# Patient Record
Sex: Female | Born: 1945
Health system: Southern US, Community
[De-identification: ages and names within clinical notes are randomized; demographics above are authoritative.]

## PROBLEM LIST (undated history)

## (undated) DIAGNOSIS — K219 Gastro-esophageal reflux disease without esophagitis: Secondary | ICD-10-CM

## (undated) DIAGNOSIS — E7800 Pure hypercholesterolemia, unspecified: Secondary | ICD-10-CM

## (undated) DIAGNOSIS — E279 Disorder of adrenal gland, unspecified: Secondary | ICD-10-CM

## (undated) DIAGNOSIS — I4891 Unspecified atrial fibrillation: Secondary | ICD-10-CM

## (undated) DIAGNOSIS — Z9889 Other specified postprocedural states: Secondary | ICD-10-CM

## (undated) DIAGNOSIS — E278 Other specified disorders of adrenal gland: Secondary | ICD-10-CM

## (undated) DIAGNOSIS — C801 Malignant (primary) neoplasm, unspecified: Secondary | ICD-10-CM

## (undated) DIAGNOSIS — R519 Headache, unspecified: Secondary | ICD-10-CM

## (undated) DIAGNOSIS — D696 Thrombocytopenia, unspecified: Secondary | ICD-10-CM

## (undated) DIAGNOSIS — E041 Nontoxic single thyroid nodule: Secondary | ICD-10-CM

## (undated) DIAGNOSIS — E785 Hyperlipidemia, unspecified: Secondary | ICD-10-CM

## (undated) DIAGNOSIS — D72819 Decreased white blood cell count, unspecified: Secondary | ICD-10-CM

## (undated) DIAGNOSIS — N390 Urinary tract infection, site not specified: Secondary | ICD-10-CM

## (undated) DIAGNOSIS — R112 Nausea with vomiting, unspecified: Secondary | ICD-10-CM

## (undated) DIAGNOSIS — J189 Pneumonia, unspecified organism: Secondary | ICD-10-CM

## (undated) DIAGNOSIS — K140 Glossitis: Secondary | ICD-10-CM

## (undated) DIAGNOSIS — E871 Hypo-osmolality and hyponatremia: Secondary | ICD-10-CM

## (undated) DIAGNOSIS — N951 Menopausal and female climacteric states: Secondary | ICD-10-CM

## (undated) DIAGNOSIS — I10 Essential (primary) hypertension: Secondary | ICD-10-CM

## (undated) DIAGNOSIS — M1990 Unspecified osteoarthritis, unspecified site: Secondary | ICD-10-CM

## (undated) DIAGNOSIS — N952 Postmenopausal atrophic vaginitis: Secondary | ICD-10-CM

## (undated) DIAGNOSIS — F419 Anxiety disorder, unspecified: Secondary | ICD-10-CM

## (undated) DIAGNOSIS — I499 Cardiac arrhythmia, unspecified: Secondary | ICD-10-CM

## (undated) HISTORY — DX: Postmenopausal atrophic vaginitis: N95.2

## (undated) HISTORY — DX: Thrombocytopenia, unspecified: D69.6

## (undated) HISTORY — DX: Menopausal and female climacteric states: N95.1

## (undated) HISTORY — DX: Hyperlipidemia, unspecified: E78.5

## (undated) HISTORY — DX: Pure hypercholesterolemia, unspecified: E78.00

## (undated) HISTORY — DX: Unspecified atrial fibrillation: I48.91

## (undated) HISTORY — DX: Decreased white blood cell count, unspecified: D72.819

## (undated) HISTORY — PX: TUBAL LIGATION: SHX77

## (undated) HISTORY — PX: EYE SURGERY: SHX253

## (undated) HISTORY — PX: REPLACEMENT TOTAL KNEE: SUR1224

## (undated) HISTORY — PX: HERNIA REPAIR: SHX51

## (undated) HISTORY — DX: Nontoxic single thyroid nodule: E04.1

## (undated) HISTORY — DX: Other specified disorders of adrenal gland: E27.8

## (undated) HISTORY — PX: COLONOSCOPY: SHX174

## (undated) HISTORY — DX: Anxiety disorder, unspecified: F41.9

## (undated) HISTORY — PX: TONSILLECTOMY: SUR1361

## (undated) HISTORY — PX: JOINT REPLACEMENT: SHX530

## (undated) HISTORY — DX: Disorder of adrenal gland, unspecified: E27.9

## (undated) HISTORY — PX: ESOPHAGOGASTRODUODENOSCOPY: SHX1529

---

## 1998-09-19 ENCOUNTER — Other Ambulatory Visit: Admission: RE | Admit: 1998-09-19 | Discharge: 1998-09-19 | Payer: Self-pay | Admitting: Obstetrics & Gynecology

## 1999-03-19 ENCOUNTER — Other Ambulatory Visit: Admission: RE | Admit: 1999-03-19 | Discharge: 1999-03-19 | Payer: Self-pay | Admitting: Obstetrics & Gynecology

## 1999-09-29 ENCOUNTER — Other Ambulatory Visit: Admission: RE | Admit: 1999-09-29 | Discharge: 1999-09-29 | Payer: Self-pay | Admitting: Obstetrics & Gynecology

## 1999-11-25 ENCOUNTER — Encounter: Payer: Self-pay | Admitting: Obstetrics & Gynecology

## 1999-11-25 ENCOUNTER — Encounter: Admission: RE | Admit: 1999-11-25 | Discharge: 1999-11-25 | Payer: Self-pay | Admitting: Obstetrics & Gynecology

## 2000-11-29 ENCOUNTER — Other Ambulatory Visit: Admission: RE | Admit: 2000-11-29 | Discharge: 2000-11-29 | Payer: Self-pay | Admitting: Obstetrics & Gynecology

## 2000-12-29 ENCOUNTER — Encounter: Payer: Self-pay | Admitting: Obstetrics & Gynecology

## 2000-12-29 ENCOUNTER — Encounter: Admission: RE | Admit: 2000-12-29 | Discharge: 2000-12-29 | Payer: Self-pay | Admitting: Obstetrics & Gynecology

## 2001-12-30 ENCOUNTER — Encounter: Payer: Self-pay | Admitting: Obstetrics & Gynecology

## 2001-12-30 ENCOUNTER — Encounter: Admission: RE | Admit: 2001-12-30 | Discharge: 2001-12-30 | Payer: Self-pay | Admitting: Obstetrics & Gynecology

## 2002-06-22 ENCOUNTER — Other Ambulatory Visit: Admission: RE | Admit: 2002-06-22 | Discharge: 2002-06-22 | Payer: Self-pay | Admitting: Obstetrics & Gynecology

## 2003-01-11 ENCOUNTER — Encounter: Payer: Self-pay | Admitting: Obstetrics & Gynecology

## 2003-01-11 ENCOUNTER — Encounter: Admission: RE | Admit: 2003-01-11 | Discharge: 2003-01-11 | Payer: Self-pay | Admitting: Obstetrics & Gynecology

## 2003-07-05 ENCOUNTER — Other Ambulatory Visit: Admission: RE | Admit: 2003-07-05 | Discharge: 2003-07-05 | Payer: Self-pay | Admitting: Obstetrics & Gynecology

## 2004-09-15 ENCOUNTER — Other Ambulatory Visit: Admission: RE | Admit: 2004-09-15 | Discharge: 2004-09-15 | Payer: Self-pay | Admitting: Obstetrics & Gynecology

## 2005-01-12 ENCOUNTER — Encounter: Admission: RE | Admit: 2005-01-12 | Discharge: 2005-01-12 | Payer: Self-pay | Admitting: Obstetrics & Gynecology

## 2005-01-22 ENCOUNTER — Encounter: Admission: RE | Admit: 2005-01-22 | Discharge: 2005-01-22 | Payer: Self-pay | Admitting: Obstetrics & Gynecology

## 2006-01-15 ENCOUNTER — Encounter: Admission: RE | Admit: 2006-01-15 | Discharge: 2006-01-15 | Payer: Self-pay | Admitting: Obstetrics & Gynecology

## 2006-03-30 ENCOUNTER — Encounter: Admission: RE | Admit: 2006-03-30 | Discharge: 2006-03-30 | Payer: Self-pay | Admitting: Surgery

## 2006-04-16 ENCOUNTER — Ambulatory Visit (HOSPITAL_COMMUNITY): Admission: RE | Admit: 2006-04-16 | Discharge: 2006-04-16 | Payer: Self-pay | Admitting: Surgery

## 2006-09-23 ENCOUNTER — Encounter: Admission: RE | Admit: 2006-09-23 | Discharge: 2006-09-23 | Payer: Self-pay | Admitting: Surgery

## 2006-10-21 ENCOUNTER — Ambulatory Visit (HOSPITAL_BASED_OUTPATIENT_CLINIC_OR_DEPARTMENT_OTHER): Admission: RE | Admit: 2006-10-21 | Discharge: 2006-10-21 | Payer: Self-pay | Admitting: Surgery

## 2006-11-05 ENCOUNTER — Encounter: Admission: RE | Admit: 2006-11-05 | Discharge: 2006-11-05 | Payer: Self-pay | Admitting: Orthopedic Surgery

## 2007-01-06 ENCOUNTER — Ambulatory Visit (HOSPITAL_BASED_OUTPATIENT_CLINIC_OR_DEPARTMENT_OTHER): Admission: RE | Admit: 2007-01-06 | Discharge: 2007-01-06 | Payer: Self-pay | Admitting: Orthopedic Surgery

## 2007-01-06 ENCOUNTER — Encounter (INDEPENDENT_AMBULATORY_CARE_PROVIDER_SITE_OTHER): Payer: Self-pay | Admitting: Orthopedic Surgery

## 2007-01-17 ENCOUNTER — Encounter: Admission: RE | Admit: 2007-01-17 | Discharge: 2007-01-17 | Payer: Self-pay | Admitting: Family Medicine

## 2008-01-18 ENCOUNTER — Encounter: Admission: RE | Admit: 2008-01-18 | Discharge: 2008-01-18 | Payer: Self-pay | Admitting: Family Medicine

## 2008-09-08 ENCOUNTER — Inpatient Hospital Stay (HOSPITAL_COMMUNITY): Admission: EM | Admit: 2008-09-08 | Discharge: 2008-09-10 | Payer: Self-pay | Admitting: Emergency Medicine

## 2008-10-22 ENCOUNTER — Ambulatory Visit: Payer: Self-pay | Admitting: General Practice

## 2008-11-07 ENCOUNTER — Inpatient Hospital Stay: Payer: Self-pay | Admitting: General Practice

## 2008-12-05 ENCOUNTER — Encounter: Admission: RE | Admit: 2008-12-05 | Discharge: 2009-01-10 | Payer: Self-pay | Admitting: Orthopedic Surgery

## 2009-01-18 ENCOUNTER — Encounter: Admission: RE | Admit: 2009-01-18 | Discharge: 2009-01-18 | Payer: Self-pay | Admitting: Family Medicine

## 2010-01-24 ENCOUNTER — Encounter: Admission: RE | Admit: 2010-01-24 | Discharge: 2010-01-24 | Payer: Self-pay | Admitting: Family Medicine

## 2010-05-18 ENCOUNTER — Encounter: Payer: Self-pay | Admitting: Obstetrics & Gynecology

## 2010-06-15 ENCOUNTER — Ambulatory Visit (INDEPENDENT_AMBULATORY_CARE_PROVIDER_SITE_OTHER): Payer: Medicare Other

## 2010-06-15 ENCOUNTER — Inpatient Hospital Stay (INDEPENDENT_AMBULATORY_CARE_PROVIDER_SITE_OTHER)
Admission: RE | Admit: 2010-06-15 | Discharge: 2010-06-15 | Disposition: A | Discharge status: Home / Self Care | Payer: Medicare Other | Source: Ambulatory Visit | Attending: Family Medicine | Admitting: Family Medicine

## 2010-06-15 DIAGNOSIS — S93409A Sprain of unspecified ligament of unspecified ankle, initial encounter: Secondary | ICD-10-CM

## 2010-08-05 LAB — BASIC METABOLIC PANEL
BUN: 10 mg/dL (ref 6–23)
BUN: 10 mg/dL (ref 6–23)
BUN: 11 mg/dL (ref 6–23)
BUN: 9 mg/dL (ref 6–23)
BUN: 6 mg/dL (ref 6–23)
BUN: 7 mg/dL (ref 6–23)
CO2: 21 mEq/L (ref 19–32)
CO2: 24 mEq/L (ref 19–32)
CO2: 25 mEq/L (ref 19–32)
CO2: 21 mEq/L (ref 19–32)
Calcium: 8.5 mg/dL (ref 8.4–10.5)
Calcium: 8.7 mg/dL (ref 8.4–10.5)
Calcium: 9 mg/dL (ref 8.4–10.5)
Calcium: 8.7 mg/dL (ref 8.4–10.5)
Chloride: 89 mEq/L — ABNORMAL LOW (ref 96–112)
Chloride: 91 mEq/L — ABNORMAL LOW (ref 96–112)
Chloride: 90 mEq/L — ABNORMAL LOW (ref 96–112)
Creatinine, Ser: 0.74 mg/dL (ref 0.4–1.2)
Creatinine, Ser: 0.75 mg/dL (ref 0.4–1.2)
Creatinine, Ser: 0.78 mg/dL (ref 0.4–1.2)
Creatinine, Ser: 0.86 mg/dL (ref 0.4–1.2)
Creatinine, Ser: 0.59 mg/dL (ref 0.4–1.2)
Creatinine, Ser: 0.64 mg/dL (ref 0.4–1.2)
GFR calc Af Amer: >60 mL/min (ref >60)
GFR calc Af Amer: >60 mL/min (ref >60)
GFR calc Af Amer: >60 mL/min (ref >60)
GFR calc Af Amer: >60 mL/min (ref >60)
GFR calc non Af Amer: >60 mL/min (ref >60)
GFR calc non Af Amer: >60 mL/min (ref >60)
GFR calc non Af Amer: >60 mL/min (ref >60)
GFR calc non Af Amer: >60 mL/min (ref >60)
GFR calc non Af Amer: >60 mL/min (ref >60)
Glucose, Bld: 109 mg/dL — ABNORMAL HIGH (ref 70–99)
Glucose, Bld: 117 mg/dL — ABNORMAL HIGH (ref 70–99)
Glucose, Bld: 118 mg/dL — ABNORMAL HIGH (ref 70–99)
Glucose, Bld: 149 mg/dL — ABNORMAL HIGH (ref 70–99)
Glucose, Bld: 152 mg/dL — ABNORMAL HIGH (ref 70–99)
Potassium: 3.8 mEq/L (ref 3.5–5.1)
Potassium: 3.9 mEq/L (ref 3.5–5.1)
Potassium: 3.9 mEq/L (ref 3.5–5.1)
Potassium: 3.9 mEq/L (ref 3.5–5.1)
Potassium: 4 mEq/L (ref 3.5–5.1)
Sodium: 121 mEq/L — ABNORMAL LOW (ref 135–145)
Sodium: 121 mEq/L — ABNORMAL LOW (ref 135–145)
Sodium: 119 mEq/L — CL (ref 135–145)
Sodium: 119 mEq/L — CL (ref 135–145)

## 2010-08-05 LAB — CBC
HCT: 33 % — ABNORMAL LOW (ref 36.0–46.0)
HCT: 38.5 % (ref 36.0–46.0)
Hemoglobin: 11.5 g/dL — ABNORMAL LOW (ref 12.0–15.0)
Hemoglobin: 13.1 g/dL (ref 12.0–15.0)
MCHC: 34.8 g/dL (ref 30.0–36.0)
MCV: 89.4 fL (ref 78.0–100.0)
MCV: 89.4 fL (ref 78.0–100.0)
RBC: 3.69 MIL/uL — ABNORMAL LOW (ref 3.87–5.11)
RBC: 4.31 MIL/uL (ref 3.87–5.11)
RDW: 12.7 % (ref 11.5–15.5)
RDW: 12.4 % (ref 11.5–15.5)
WBC: 7 10*3/uL (ref 4.0–10.5)

## 2010-08-05 LAB — DIFFERENTIAL
Eosinophils Absolute: 0 10*3/uL (ref 0.0–0.7)
Eosinophils Relative: 0 % (ref 0–5)
Monocytes Relative: 2 % — ABNORMAL LOW (ref 3–12)
Neutro Abs: 6.4 10*3/uL (ref 1.7–7.7)
Neutrophils Relative %: 92 % — ABNORMAL HIGH (ref 43–77)

## 2010-08-05 LAB — URINALYSIS, ROUTINE W REFLEX MICROSCOPIC
Leukocytes, UA: NEGATIVE
Nitrite: NEGATIVE
Protein, ur: NEGATIVE mg/dL
Specific Gravity, Urine: 1.017 (ref 1.005–1.030)
Urobilinogen, UA: 0.2 mg/dL (ref 0.0–1.0)
pH: 7 (ref 5.0–8.0)

## 2010-08-05 LAB — OSMOLALITY, URINE: Osmolality, Ur: 496 mOsm/kg (ref 390–1090)

## 2010-08-05 LAB — HEPATIC FUNCTION PANEL
ALT: 20 U/L (ref 0–35)
AST: 24 U/L (ref 0–37)
Albumin: 3.7 g/dL (ref 3.5–5.2)
Alkaline Phosphatase: 99 U/L (ref 39–117)
Bilirubin, Direct: <0.1 mg/dL (ref 0.0–0.3)
Total Bilirubin: 0.6 mg/dL (ref 0.3–1.2)
Total Protein: 6.8 g/dL (ref 6.0–8.3)

## 2010-08-05 LAB — TSH: TSH: 0.456 u[IU]/mL (ref 0.350–4.500)

## 2010-08-05 LAB — CORTISOL-AM, BLOOD: Cortisol - AM: 5.6 ug/dL (ref 4.3–22.4)

## 2010-08-05 LAB — SODIUM, URINE, RANDOM: Sodium, Ur: 74 mEq/L

## 2010-08-05 LAB — OSMOLALITY: Osmolality: 248 mOsm/kg — ABNORMAL LOW (ref 275–300)

## 2010-08-05 LAB — URINE MICROSCOPIC-ADD ON

## 2010-09-09 NOTE — Discharge Summary (Signed)
NAMEBARRY, Erika [name]                 ACCOUNT NO.:  [id number]   MEDICAL RECORD NO.:  [id number]          PATIENT TYPE:  INP   LOCATION:  1514                         FACILITY:  [facility]   PHYSICIAN:  Peggye Pitt, M.D. DATE OF BIRTH:  11/09/1945   DATE OF ADMISSION:  09/08/2008  DATE OF DISCHARGE:  09/10/2008                               DISCHARGE SUMMARY   DISCHARGE DIAGNOSES:  1. Hypovolemic, hypotonic hyponatremia, resolved.  2. Headache, resolved.  3. Hypertension.  4. Partially treated urinary tract infection.  5. Arthritis of the right knee soon to go for right knee replacement.  6. Right inguinal hernia status post repair.  7. Status post right ganglion cyst excision.   DISCHARGE MEDICATIONS:  1. Lisinopril 20 mg daily.  2. Indomethacin 50 mg 3 times a day.  3. Sulfamethoxazole/Trimethoprim 800/160 mg one tablet 3 times a day      for 1 more day.  4. She is instructed not to take the hydrochlorothiazide anymore.   DISPOSITION AND FOLLOWUP:  Erika [name] is discharged home today in  stable condition.  She is instructed to return to her primary care  physician in 2 weeks time for a BMET to follow up on her sodium levels.   CONSULTATIONS:  None.   IMAGES AND PROCEDURES:  1. Chest x-ray on Sep 08, 2008 that showed no acute cardiopulmonary      disease.  2. CT scan of the head without contrast on Sep 08, 2008 that showed no      acute intracranial pathology.   HISTORY AN PHYSICAL EXAMINATION:  For full details, please refer to  dictation on Sep 08, 2008 by Dr. Andee Poles; but, in brief, Erika [name] is  a pleasant 65 year old Caucasian woman with a history of hypertension  who presented to the hospital with a severe headache of 1 day duration  associated with nausea and vomiting.  Here in the emergency department  she was found to be severely hyponatremic with a sodium level of 119 and  hence we were called to admit her for further evaluation and management.   HOSPITAL  COURSE:  1:  Headache.  Her headache has completely resolved  with restoration of her sodium levels.  1. Hyponatremia:  This was hypotonic with serum osmolality of 245.      She appeared to be hypovolemic to me so I discontinued her free      water restriction and started her on some normal saline.  With the      IV fluids and discontinuation of her hydrochlorothiazide, her      sodium has returned to 131 on the day of discharge.  With      resolution of her sodium levels to normal levels, her headache has      also completely resolved.  Her cortisol and TSH level have been      completely normal as well.  She should be off diuretics      indefinitely and, hence, I have increased the dose of her      lisinopril for her blood pressure control upon discharge.  2.  Hypertension.  She has been maintaining excellent blood pressure      while in the hospital.  3. Partially treated urinary tract infection.  Upon admission, she had      already started a course of Bactrim for her urinary tract      infection.  She has 1 more day left of treatment at the time of      discharge.  4. The rest of her chronic medical issues have not been a problem      during this hospitalization.   PHYSICAL EXAMINATION:  VITAL SIGNS:  On the day of discharge, her blood  pressure is 116/72, heart rate 67, respirations 20, oxygen saturation  96% on room air with a temperature of 97.7.   LABORATORY DATA:  On the day of discharge, sodium is 131, potassium 3.8,  chloride 102, bicarb 25, BUN 7, creatinine 0.74 with glucose of 91.  White blood cell count 6.1, hemoglobin 11.5, platelet count of 288,000.      Peggye Pitt, M.D.  Electronically Signed     EH/MEDQ  D:  09/10/2008  T:  09/10/2008  Job:  [id number]

## 2010-09-09 NOTE — Op Note (Signed)
Erika [name], NO                 ACCOUNT NO.:  [id number]   MEDICAL RECORD NO.:  [id number]          PATIENT TYPE:  AMB   LOCATION:  NESC                         [facility]ACILITY:  [facility]   PHYSICIAN:  Deidre Ala, M.D.    DATE OF BIRTH:  [birth year]   DATE OF PROCEDURE:  01/06/2007  DATE OF DISCHARGE:                               OPERATIVE REPORT   PREOPERATIVE DIAGNOSIS:  1. Transiently enlarging ganglion cyst right volar distal ulna at      wrist.  2. Intermittent compression of ulnar nerve proximal to Guyon's canal.  3. Painful prominent osteophyte distal volar ulna   POSTOPERATIVE DIAGNOSES:  1. Ganglion cyst with stalk into distal radioulnar joint right volar      ulnar wrist.  2. Intermittent ulnar nerve compression secondary to osteophyte volar      ulna, distal ulna, and intermittently swollen ganglion.  3. Osteophyte prominent distal volar ulnar lip at ulnar head.   PROCEDURES:  1. Exploration of distal volar ulnar left wrist.  2. Excision ganglion cyst at distal radioulnar joint, with stalk into      joint, and over distal volar ulna.  3. Neurolysis ulnar nerve proximal to Guyon's canal.  4. Partially excision of distal ulna prominent osteophyte volar lip.   SURGEON:  Drema Pry, M.D.   ASSISTANT:  Phineas Semen, P.A.-C.   ANESTHESIA:  IV regional.   CULTURES:  None.   DRAINS:  None.   ESTIMATED BLOOD LOSS:  Minimal.   TOURNIQUET TIME:  37 minutes.   SPECIMEN:  Ganglion cyst.   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  This active a swollen mass that comes and goes.  Today, it is small, yesterday it was large, producing intermittent  numbness and tingling into the volar ulnar aspect of the palm, not at  Guyon's canal, but into the fourth and fifth fingers without motor  weakness.  It feels like it moves around.  Today, it is much  smaller.  At surgery preop today, one could not feel the ganglion, but she had a  very sharp prominent osteophyte on the volar aspect of the distal ulna.  This is right where she was tender.  The nerve had compressive symptoms,  with compression right over the top.  The flexor carpi ulnaris tendon  did not seem to be as involved.  She elected to proceed with surgical  exploration.  At surgery, the ulnar nerve was compressed within its  epineurium just proximal to Guyon's canal and somewhat reddened.  The  FCU was uninvolved.  There was a compressed ganglion over the top of the  distal volar distal volar ulna; when cut into, it leaked ganglion fluid.  This was excised off the distal volar ulna, and the stalk went down into  the RU joint, and this was fully excised.  It was the size of a  collapsed grape.  This was excised off the bone, with the stalk down  into the  RU joint and cauterized to prevent recurrence.  The prominent  osteophyte then was removed with a rongeur, and epineurectomy and  neurolysis carried out on the ulnar nerve well up into the proximal  aspect of Guyon's canal.  We felt we had gotten her symptomatic site and  that this was a variably sized ganglion based on activity from the RU  joint from pronation and supination.  There was a fair amount of  mobility of the distal RU joint.  She was not painful preoperatively to  compression of the joint, but I think this was the source of her  ganglion cyst and the nerve compression of the ulnar nerve.  I also  think the prominent distal volar ulna was irritating the area, and I  think this is all nicely decompressed.  Again, she did not have symptoms  of RU joint compression or force pronation and supination in the RU  joint, so I think that was not as symptomatic as the ganglion cyst  itself.  This is exactly how she described it preoperatively.   PROCEDURE:  With adequate anesthesia obtained using IV regional  technique, the  patient was placed in the supine position.  The left  upper extremity was prepped from the fingertips to the upper forearm in  the standard fashion.  After standard prepping and draping, an incision  was made with the apex ulnar over the distal volar ulnar wrist proximal  to the distal wrist flexion crease and extended about 6 cm proximal.  The incision was deepened sharply with a knife and hemostasis obtained  using the Bovie electrocoagulator.  Under loupe magnification, a tieback  was used to bring up the flap.  The flap was elevated thickly.  We then  explored the flexor carpi ulnaris, localized the nerve and artery, and  gently retracted it.  Then, carried attention down to the distal volar  ulna, where I had palpated the sharp osteophyte.  I made a longitudinal  incision into it.  This was a collapsed ganglion.  I then excised it  totally off the distal volar ulna and traced its stalk down into the RU  joint and removed it from the base of the stalk and cauterized it.  I  then took a rongeur and smoothed the volar distal ulna spike that was  quite prominent, flattened so would not rub, and then I with loupe  magnification neurolysed any transversing band of the ulnar nerve  proximal to the Guyon's canal and just into it and proximal up the  forearm so ti was completely free with volar epineurotomy carried out  somewhat like a carpal tunnel.  Irrigation was carried out.  The wound  was then closed with interrupted and running 4-0 nylon with an apex  stitch.  A bulky sterile compressive dressing was applied with a volar  plaster splint in slight cockup.  The tourniquet was let down.  The  patient then, having tolerated the procedure well, was taken to the  recovery room in satisfactory condition for routine postoperative care.  I have infiltrated around the wound about 7 cc of 0.5% Marcaine plain.  She was then to be discharged per outpatient routine.  Elevation.  Told  call the office  for appointment for recheck on Monday.  Given Vicodin  for pain.           ______________________________  V. Charlesetta Shanks, M.D.     VEP/MEDQ  D:  01/06/2007  T:  01/07/2007  Job:  [id number] 

## 2010-09-09 NOTE — H&P (Signed)
NAMEMARZETTA, [name]                 ACCOUNT NO.:  [id number]   MEDICAL RECORD NO.:  [id number]          PATIENT TYPE:  EMS   LOCATION:  ED                           [facility]ACILITY:  [facility]   PHYSICIAN:  Sarajane Marek, MD     DATE OF BIRTH:  [birth year]   DATE OF ADMISSION:  09/08/2008  DATE OF DISCHARGE:                              HISTORY & PHYSICAL   CHIEF COMPLAINT:  Headache.   HISTORY OF PRESENT ILLNESS:  [name] is a 65 year old Caucasian  female with a history of hypertension presenting with severe headache x1  day which is associated with nausea and vomiting.  She does not have any  change in her vision.  She has no focal weakness or numbness.  No  presyncope or syncope.  She has no history of migraine and does not use  estrogen replacement therapy.  She denies lower extremity edema,  orthopnea, PND or increased abdominal girth.  She reports slight  intermittent headaches over several weeks but has no significant history  for headaches.  She presented to an urgent care facility today and was  noted to be hyponatremic.  She reports that her electrolytes have not  been checked in several years.  In the emergency department she received 500 mL of normal saline as well  as Benadryl 25 mg IV, Compazine 10 mg IV and dexamethasone 10 mg IV.   PAST MEDICAL HISTORY:  1. Arthritis of the right knee.  2. Hypertension.  3. Right internal hernia status post repair.  4. Status post right ganglion cyst excision.   ALLERGIES:  CODEINE.   MEDICATIONS:  1. Lisinopril/HCTZ, dose unknown.  Has been on this for several years.  2. Sulfa drug that she started 3 days ago for urinary tract infection.  3. Anti-inflammatory medications which she does not know the name of.   SOCIAL HISTORY:  She has no history of alcohol or tobacco use.  She  works as a Hydrologist.   FAMILY HISTORY:  Her mother is deceased at age 4 with CHF.  She also  had arthritis.  Her father is deceased at age 7 with a  CVA.   REVIEW OF SYSTEMS:  Complete 14 point review of systems was performed  and was negative except as per HPI.   PHYSICAL EXAM:  VITAL SIGNS:  Temperature is 97.6, respiratory rate 20,  heart rate 97, oxygen saturation 98% on room air, blood pressure 152/92.  GENERAL:  This is a 65 year old Caucasian female in no acute distress.  EYES:  Pupils are equal, round, reactive to light and accommodation.  Extraocular muscles are intact and sclerae anicteric.  ENT:  Nares are clear.  Mucous membranes are moist.  Oropharynx clear.  NECK:  Supple without lymphadenopathy, no thyromegaly, bruit or JVD.  CARDIOVASCULAR:  Heart rate is regular with normal S1, S2.  No S3, S4.  There is a 3/6 systolic murmur at the left sternal border with a normal  but prominent PMI  RESPIRATORY::  Clear to auscultation bilaterally without wheezes,  rhonchi or rales.  No increased work of breathing or  tachypnea.  GI/ABDOMEN:  Normal bowel sounds, soft, nontender, nondistended.  NEURO:  The patient is alert and oriented x3 with no focal deficits.  EXTREMITIES:  There is no lower extremity edema.  No clubbing or  cyanosis.  She has 2+ pulses x4 extremities.  PSYCHIATRIC:  Normal judgment, insight.   LABORATORY DATA:  Sodium is 119, potassium 4.2, chloride 87, bicarb 26,  BUN 7, creatinine 0.6, glucose 149, calcium is 8.9.  White blood cell count is 7, hemoglobin 13, hematocrit 38.5 and  platelets 351.  Urinalysis with specific gravity of 1.017, pH of 7.0, trace ketones,  negative leuk esterase, negative nitrites, trace blood.  Head CT without acute intracranial abnormalities.   ASSESSMENT AND PLAN:  [name] with hyponatremia in  the setting of euvolemia.  1. She will be admitted to the [facility] for evaluation.      Differential diagnosis includes symptom of inappropriate      antidiuretic hormone versus HCTZ versus endocrinopathy versus      pulmonary disease.  2. We will send urine  sodium, urine osmolarity and serum osmolarity  .  3. We will stop her hydrochlorothiazide and continue lisinopril for      blood pressure control.  We will check a TSH and a.m. cortisol.      Send a two view chest x-ray.  4. I will hold intravenous fluids for now as in the setting of symptom      inappropriate antidiuretic hormone this will worsen hyponatremia.      We will also check sodium every 6 hours overnight.  5. She will be offered  p.r.n. Phenergan and Tylenol for pain and      nausea.  6. We will continue Bactrim for her urinary tract infection.      Sarajane Marek, MD  Electronically Signed     HH/MEDQ  D:  09/08/2008  T:  09/09/2008  Job:  [id number]

## 2010-09-09 NOTE — Op Note (Signed)
NAMEMARILENA, [name]                 ACCOUNT NO.:  [id number]   MEDICAL RECORD NO.:  [id number]          PATIENT TYPE:  AMB   LOCATION:  NESC                         FACILITY:  [facility]   PHYSICIAN:  Wilmon Arms. Corliss Skains, M.D. DATE OF BIRTH:  1946-03-16   DATE OF PROCEDURE:  10/21/2006  DATE OF DISCHARGE:                               OPERATIVE REPORT   PREOPERATIVE DIAGNOSIS:  Recurrent right inguinal hernia.   POSTOPERATIVE DIAGNOSIS:  Recurrent right inguinal hernia.   PROCEDURE PERFORMED:  Recurrent right inguinal hernia repair with mesh.   SURGEON:  Wilmon Arms. Tsuei, M.D.   ANESTHESIA:  General.   INDICATIONS:  The patient is a 65 year old female in good health who  underwent a right inguinal hernia repair in January of 2008.  She did  fairly well, but in mid-February she had an episode of severe right  groin pain which resolved spontaneously.  On reexamination, there was no  sign of recurrent hernia at that time.  However, in the intervening  months she has had appearance of a bulge in her right groin.  This seems  to push down onto her leg.  She comes today for re-exploration and  repair.   DESCRIPTION OF PROCEDURE:  The patient was brought to the operating room  and placed in the supine position on the operating table.  After an  adequate level of general anesthesia was obtained, the patient's right  groin was shaved, prepped with Betadine, and draped in sterile fashion.  Her previous incision was infiltrated with 4% Marcaine.   We opened up the incision with a scalpel.  Dissection was carried down  through the subcutaneous tissues with cautery.  I was able to identify  the fascia and could see the mesh through the fascia.  The fascia was  opened with cautery.  Blunt dissection was used to dissect the fascia  away from the mesh.  This was fairly difficult due to a large amount of  scarring.  We examined the bottom edge of the mesh.  The Prolene sutures  that had been placed  in the lower edge of the mesh were visible.  Two of  these appeared to have pulled through the tissue at the lower edge.  Therefore, the lower edge of the mesh did not seem to be attached.  We  bluntly dissected around this area, and a 6-mm direct defect was noted  just below the inferior edge of the mesh.  This direct defect was then  plugged with a piece of Ultrapro mesh which was rolled up and inserted.  It was secured in place with two interrupted 2-0 Vicryl sutures.  I then  cut another oval piece of Ultrapro mesh and secured this on top of the  previous mesh.  This was secured with interrupted 2-0 Prolene sutures  circumferentially.  The lateral end was tucked underneath the external  oblique fascia laterally.  The remaining fascia was then closed anterior  to the mesh with 2-0 Vicryl suture.  A 3-0 Vicryl was used to close the  subcutaneous tissues, and 4-0 Monocryl was used to  close the skin.  Steri-Strips and clean dressings were applied.   The patient was extubated and brought to the recovery room in stable  condition.  All sponge, instrument, and needle counts were correct.      Wilmon Arms. Tsuei, M.D.  Electronically Signed     MKT/MEDQ  D:  10/21/2006  T:  10/21/2006  Job:  [id number]

## 2010-09-12 NOTE — Op Note (Signed)
NAMEALABAMA, DOIG                 ACCOUNT NO.:  [id number]   MEDICAL RECORD NO.:  [id number]          PATIENT TYPE:  AMB   LOCATION:  DAY                          FACILITY:  [facility]   PHYSICIAN:  Wilmon Arms. Corliss Skains, M.D. DATE OF BIRTH:  [birth year]   DATE OF PROCEDURE:  04/16/2006  DATE OF DISCHARGE:                               OPERATIVE REPORT   PREOPERATIVE DIAGNOSIS:  Right inguinal hernia.   POSTOPERATIVE DIAGNOSIS:  Right inguinal hernia.   PROCEDURE PERFORMED:  Right inguinal hernia repair with mesh.   SURGEON:  Dr. Corliss Skains   ANESTHESIA:  General endotracheal.   INDICATIONS:  The patient is a 65 year old female, who presents with  painful bulge in her right groin.  This occurs with heavy lifting.  A CT  scan confirmed the finding of a right inguinal hernia containing only  fat.  We recommended mesh repair.   DESCRIPTION OF PROCEDURE:  The patient was brought to the operating room  and placed in a supine position on the operating room table.  After an  adequate level of general anesthesia was obtained, the patient's right  groin was shaved, prepped with Betadine, and draped in sterile fashion.  The area above the inguinal ligament was infiltrated with 0.25%  Marcaine.  The skin was opened with a scalpel.  Dissection was carried  down to the external oblique fascia with cautery.  The external oblique  fascia was opened along the direction of its fibers down to the external  ring.  The round ligament was circumferentially dissected.  This was  ligated with a hemostat and 2-0 silk suture ligatures and divided.  A  large direct defect was noted.  The floor of the inguinal canal was then  repaired with a running #1 Novofil suture.  Ultrapro mesh was cut into a  flat keyhole fashion.  This was sutured in place with 2-0 Prolene  suture, beginning at the pubic tubercle.  The tails were tucked  underneath the external oblique fascia laterally.  The external oblique  fascia was  reapproximated with 2-0 Vicryl.  Then 3-0 Vicryl was used to  close subcutaneous tissues, and 4-0 Monocryl was used to close the skin.  Steri-Strips and clean dressings were applied.  The patient was  extubated and brought to recovery room in stable condition.  All sponge,  instrument, and needle counts were correct.      Wilmon Arms. Tsuei, M.D.  Electronically Signed     MKT/MEDQ  D:  04/16/2006  T:  04/17/2006  Job:  [id number]

## 2011-01-22 ENCOUNTER — Other Ambulatory Visit: Payer: Self-pay | Admitting: Family Medicine

## 2011-01-22 DIAGNOSIS — Z1231 Encounter for screening mammogram for malignant neoplasm of breast: Secondary | ICD-10-CM

## 2011-02-05 ENCOUNTER — Ambulatory Visit
Admission: RE | Admit: 2011-02-05 | Discharge: 2011-02-05 | Disposition: A | Discharge status: Home / Self Care | Payer: Medicare Other | Source: Ambulatory Visit | Attending: Family Medicine | Admitting: Family Medicine

## 2011-02-05 DIAGNOSIS — Z1231 Encounter for screening mammogram for malignant neoplasm of breast: Secondary | ICD-10-CM

## 2011-02-06 LAB — POCT I-STAT 4, (NA,K, GLUC, HGB,HCT)
HCT: 40
Hemoglobin: 13.6

## 2011-02-11 ENCOUNTER — Other Ambulatory Visit: Payer: Self-pay | Admitting: Family Medicine

## 2011-02-11 DIAGNOSIS — R928 Other abnormal and inconclusive findings on diagnostic imaging of breast: Secondary | ICD-10-CM

## 2011-02-11 LAB — DIFFERENTIAL
Eosinophils Relative: 4
Lymphocytes Relative: 27
Lymphs Abs: 1.4
Monocytes Absolute: 0.4

## 2011-02-11 LAB — BASIC METABOLIC PANEL
GFR calc Af Amer: >60
GFR calc non Af Amer: >60
Potassium: 4.3
Sodium: 128 — ABNORMAL LOW

## 2011-02-11 LAB — CBC
HCT: 34.2 — ABNORMAL LOW
Hemoglobin: 11.9 — ABNORMAL LOW
WBC: 5

## 2011-02-25 ENCOUNTER — Ambulatory Visit
Admission: RE | Admit: 2011-02-25 | Discharge: 2011-02-25 | Disposition: A | Discharge status: Home / Self Care | Payer: Medicare Other | Source: Ambulatory Visit | Attending: Family Medicine | Admitting: Family Medicine

## 2011-02-25 DIAGNOSIS — R928 Other abnormal and inconclusive findings on diagnostic imaging of breast: Secondary | ICD-10-CM

## 2011-06-17 DIAGNOSIS — N390 Urinary tract infection, site not specified: Secondary | ICD-10-CM | POA: Diagnosis not present

## 2011-08-25 DIAGNOSIS — Z96659 Presence of unspecified artificial knee joint: Secondary | ICD-10-CM | POA: Diagnosis not present

## 2011-08-27 DIAGNOSIS — F411 Generalized anxiety disorder: Secondary | ICD-10-CM | POA: Diagnosis not present

## 2011-08-27 DIAGNOSIS — J209 Acute bronchitis, unspecified: Secondary | ICD-10-CM | POA: Diagnosis not present

## 2011-10-27 DIAGNOSIS — H25099 Other age-related incipient cataract, unspecified eye: Secondary | ICD-10-CM | POA: Diagnosis not present

## 2011-12-15 DIAGNOSIS — M76899 Other specified enthesopathies of unspecified lower limb, excluding foot: Secondary | ICD-10-CM | POA: Diagnosis not present

## 2012-01-05 DIAGNOSIS — M76899 Other specified enthesopathies of unspecified lower limb, excluding foot: Secondary | ICD-10-CM | POA: Diagnosis not present

## 2012-01-18 DIAGNOSIS — M76899 Other specified enthesopathies of unspecified lower limb, excluding foot: Secondary | ICD-10-CM | POA: Diagnosis not present

## 2012-01-26 DIAGNOSIS — M76899 Other specified enthesopathies of unspecified lower limb, excluding foot: Secondary | ICD-10-CM | POA: Diagnosis not present

## 2012-02-09 ENCOUNTER — Other Ambulatory Visit: Payer: Self-pay | Admitting: Family Medicine

## 2012-02-09 DIAGNOSIS — Z1231 Encounter for screening mammogram for malignant neoplasm of breast: Secondary | ICD-10-CM

## 2012-02-10 DIAGNOSIS — Z79899 Other long term (current) drug therapy: Secondary | ICD-10-CM | POA: Diagnosis not present

## 2012-02-10 DIAGNOSIS — I10 Essential (primary) hypertension: Secondary | ICD-10-CM | POA: Diagnosis not present

## 2012-02-19 ENCOUNTER — Ambulatory Visit
Admission: RE | Admit: 2012-02-19 | Discharge: 2012-02-19 | Disposition: A | Discharge status: Home / Self Care | Payer: Medicare Other | Source: Ambulatory Visit | Attending: Family Medicine | Admitting: Family Medicine

## 2012-02-19 DIAGNOSIS — Z1231 Encounter for screening mammogram for malignant neoplasm of breast: Secondary | ICD-10-CM

## 2012-08-29 DIAGNOSIS — J069 Acute upper respiratory infection, unspecified: Secondary | ICD-10-CM | POA: Diagnosis not present

## 2012-09-02 ENCOUNTER — Ambulatory Visit (HOSPITAL_COMMUNITY)
Admit: 2012-09-02 | Discharge: 2012-09-02 | Disposition: A | Discharge status: Home / Self Care | Payer: Medicare Other | Source: Ambulatory Visit | Attending: Emergency Medicine | Admitting: Emergency Medicine

## 2012-09-02 ENCOUNTER — Emergency Department (INDEPENDENT_AMBULATORY_CARE_PROVIDER_SITE_OTHER)
Admission: EM | Admit: 2012-09-02 | Discharge: 2012-09-02 | Disposition: A | Discharge status: Home / Self Care | Payer: Medicare Other | Source: Home / Self Care | Attending: Emergency Medicine | Admitting: Emergency Medicine

## 2012-09-02 ENCOUNTER — Encounter (HOSPITAL_COMMUNITY): Payer: Self-pay | Admitting: Emergency Medicine

## 2012-09-02 DIAGNOSIS — R059 Cough, unspecified: Secondary | ICD-10-CM | POA: Diagnosis not present

## 2012-09-02 DIAGNOSIS — R079 Chest pain, unspecified: Secondary | ICD-10-CM | POA: Insufficient documentation

## 2012-09-02 DIAGNOSIS — I10 Essential (primary) hypertension: Secondary | ICD-10-CM | POA: Insufficient documentation

## 2012-09-02 DIAGNOSIS — J209 Acute bronchitis, unspecified: Secondary | ICD-10-CM

## 2012-09-02 DIAGNOSIS — R05 Cough: Secondary | ICD-10-CM | POA: Insufficient documentation

## 2012-09-02 HISTORY — DX: Essential (primary) hypertension: I10

## 2012-09-02 HISTORY — DX: Unspecified osteoarthritis, unspecified site: M19.90

## 2012-09-02 MED ORDER — HYDROCOD POLST-CHLORPHEN POLST 10-8 MG/5ML PO LQCR: 5.0000 mL | Freq: Two times a day (BID) | ORAL | Status: DC | PRN

## 2012-09-02 MED ORDER — ALBUTEROL SULFATE HFA 108 (90 BASE) MCG/ACT IN AERS: 1.0000 | INHALATION_SPRAY | Freq: Four times a day (QID) | RESPIRATORY_TRACT | Status: DC | PRN

## 2012-09-02 MED ORDER — AMOXICILLIN-POT CLAVULANATE 875-125 MG PO TABS: 1.0000 | ORAL_TABLET | Freq: Two times a day (BID) | ORAL | Status: DC

## 2012-09-02 MED ORDER — PREDNISONE 5 MG PO KIT: 1.0000 | PACK | Freq: Every day | ORAL | Status: DC

## 2012-09-02 NOTE — ED Notes (Signed)
Pt is here for a persistent dry cough onset 4/28 Sx also include: soreness of back due to cough, chest burning due to cough, wheezing, post nasal drip Denies: f/chills  She is alert and oriented w/no signs of acute distress.

## 2012-09-02 NOTE — ED Provider Notes (Signed)
Chief Complaint:   Chief Complaint  Patient presents with  . Cough    History of Present Illness:   Erika [name] is a 67 year old female who's had a 12 day history of dry cough, wheezing, burning in her chest, difficulty sleeping at night because of the cough, soreness in her upper back, and some posttussive vomiting. She denies any fever, chills, nasal congestion, rhinorrhea, or sore throat. She's had no abdominal pain or diarrhea. She denies any history of asthma, cigarette smoking, or reflux esophagitis. She has had a history of allergies and some postnasal drip recently. She has not been exposed to any sick contacts.  Review of Systems:  Other than noted above, the patient denies any of the following symptoms: Systemic:  No fevers, chills, sweats, weight loss or gain, or fatigue. ENT:  No nasal congestion, sneezing, itching, postnasal drip, sinus pressure, headache, sore throat, or hoarseness. Lungs:  No wheezing, shortness of breath, chest tightness or congestion. Heart:  No chest pain, tightness, pressure, PND, orthopnea, or ankle edema. GI:  No indigestion, heartburn, waterbrash, burping, abdominal pain, nausea, or vomiting.  PMFSH:  Past medical history, family history, social history, meds, and allergies were reviewed.  Specifically, there is no history of asthma, reflux esophagitis or cigarette smoking.   Physical Exam:   Vital signs:  BP 170/84  Pulse 90  Temp(Src) 97.5 F (36.4 C) (Oral)  Resp 22  SpO2 98% General:  Alert and oriented.  In no distress.  Skin warm and dry. ENT: TMs and ear canals normal.  Nasal mucosa normal, without drainage.  Pharynx clear without exudate or drainage.  No intraoral lesions. Neck:  No adenopathy, tenderness or mass.  No JVD. Lungs:  No respiratory distress.  Breath sounds clear and equal bilaterally.  No wheezes, rales or rhonchi. Heart:  Regular rhythm, no gallops or murmers.  No pedal edema. Abdomon:  Soft and nontender.  No organomegaly  or mass.  Radiology:  Dg Chest 2 View  09/02/2012  *RADIOLOGY REPORT*  Clinical Data: Chest pain.  Cough and congestion.  CHEST - 2 VIEW  Comparison: 09/08/2008.  Findings:  Cardiopericardial silhouette within normal limits. Mediastinal contours normal. Trachea midline.  No airspace disease or effusion.  Mid thoracic compression fracture appears unchanged. No pneumothorax.  Small amount of blunting of the left costophrenic angle is compatible with atelectasis or scar.  IMPRESSION: No acute cardiopulmonary disease.   Original Report Authenticated By: Andreas Newport, M.D.    Assessment:  The encounter diagnosis was Acute bronchitis.  Plan:   1.  The following meds were prescribed:   New Prescriptions   ALBUTEROL (PROVENTIL HFA;VENTOLIN HFA) 108 (90 BASE) MCG/ACT INHALER    Inhale 1-2 puffs into the lungs every 6 (six) hours as needed for wheezing.   AMOXICILLIN-CLAVULANATE (AUGMENTIN) 875-125 MG PER TABLET    Take 1 tablet by mouth 2 (two) times daily.   CHLORPHENIRAMINE-HYDROCODONE (TUSSIONEX) 10-8 MG/5ML LQCR    Take 5 mLs by mouth every 12 (twelve) hours as needed.   PREDNISONE 5 MG KIT    Take 1 kit (5 mg total) by mouth daily after breakfast. Prednisone 5 mg 6 day dosepack.  Take as directed.   2.  The patient was instructed in symptomatic care and handouts were given. 3.  The patient was told to return if becoming worse in any way, if no better in 7 days, and given some red flag symptoms such as fever, difficulty breathing, or worsening chest pain that would indicate earlier  return. 4.  Follow up here if no better in a week.      Reuben Likes, MD 09/02/12 [phone]

## 2012-09-02 NOTE — ED Notes (Signed)
Patient transported to X-ray 

## 2012-09-07 NOTE — ED Notes (Signed)
This nurse was informed patient was in department, running out of medicine and needing to know what to do.  Spoke to patient .  Offered to have her register today, or call tomorrow to see what dr Ginny Forth recommendations were.  Explained we do not provide refills on medication/cough medicines.  Patient asked what over the counter was an option.  Spoke to dr Artis Flock, suggested mucinex for cough or mucinex for allergy.  Instructed patient of these options.  Patient left department.

## 2012-09-10 ENCOUNTER — Emergency Department (HOSPITAL_COMMUNITY): Payer: Medicare Other

## 2012-09-10 ENCOUNTER — Encounter (HOSPITAL_COMMUNITY): Payer: Self-pay | Admitting: Physical Medicine and Rehabilitation

## 2012-09-10 ENCOUNTER — Emergency Department (HOSPITAL_COMMUNITY)
Admission: EM | Admit: 2012-09-10 | Discharge: 2012-09-10 | Disposition: A | Discharge status: Home / Self Care | Payer: Medicare Other | Attending: Emergency Medicine | Admitting: Emergency Medicine

## 2012-09-10 DIAGNOSIS — I10 Essential (primary) hypertension: Secondary | ICD-10-CM | POA: Diagnosis not present

## 2012-09-10 DIAGNOSIS — R079 Chest pain, unspecified: Secondary | ICD-10-CM | POA: Diagnosis not present

## 2012-09-10 DIAGNOSIS — R059 Cough, unspecified: Secondary | ICD-10-CM | POA: Insufficient documentation

## 2012-09-10 DIAGNOSIS — R0602 Shortness of breath: Secondary | ICD-10-CM | POA: Insufficient documentation

## 2012-09-10 DIAGNOSIS — IMO0002 Reserved for concepts with insufficient information to code with codable children: Secondary | ICD-10-CM | POA: Insufficient documentation

## 2012-09-10 DIAGNOSIS — J40 Bronchitis, not specified as acute or chronic: Secondary | ICD-10-CM

## 2012-09-10 DIAGNOSIS — J9819 Other pulmonary collapse: Secondary | ICD-10-CM | POA: Diagnosis not present

## 2012-09-10 DIAGNOSIS — J209 Acute bronchitis, unspecified: Secondary | ICD-10-CM | POA: Diagnosis not present

## 2012-09-10 DIAGNOSIS — R05 Cough: Secondary | ICD-10-CM | POA: Insufficient documentation

## 2012-09-10 DIAGNOSIS — Z8739 Personal history of other diseases of the musculoskeletal system and connective tissue: Secondary | ICD-10-CM | POA: Insufficient documentation

## 2012-09-10 DIAGNOSIS — Z79899 Other long term (current) drug therapy: Secondary | ICD-10-CM | POA: Diagnosis not present

## 2012-09-10 DIAGNOSIS — R091 Pleurisy: Secondary | ICD-10-CM | POA: Diagnosis not present

## 2012-09-10 LAB — COMPREHENSIVE METABOLIC PANEL
ALT: 14 U/L (ref 0–35)
Alkaline Phosphatase: 131 U/L — ABNORMAL HIGH (ref 39–117)
BUN: 6 mg/dL (ref 6–23)
CO2: 24 mEq/L (ref 19–32)
Chloride: 80 mEq/L — ABNORMAL LOW (ref 96–112)
GFR calc Af Amer: >90 mL/min (ref >90)
GFR calc non Af Amer: >90 mL/min (ref >90)
Glucose, Bld: 110 mg/dL — ABNORMAL HIGH (ref 70–99)
Potassium: 4.1 mEq/L (ref 3.5–5.1)
Sodium: 117 mEq/L — CL (ref 135–145)
Total Bilirubin: 0.3 mg/dL (ref 0.3–1.2)

## 2012-09-10 LAB — CBC WITH DIFFERENTIAL/PLATELET
Basophils Absolute: 0 10*3/uL (ref 0.0–0.1)
Eosinophils Relative: 1 % (ref 0–5)
HCT: 35.3 % — ABNORMAL LOW (ref 36.0–46.0)
Hemoglobin: 13.1 g/dL (ref 12.0–15.0)
Lymphocytes Relative: 14 % (ref 12–46)
MCHC: 37.1 g/dL — ABNORMAL HIGH (ref 30.0–36.0)
MCV: 82.5 fL (ref 78.0–100.0)
Monocytes Absolute: 0.6 10*3/uL (ref 0.1–1.0)
Monocytes Relative: 10 % (ref 3–12)
RDW: 11.8 % (ref 11.5–15.5)
WBC: 5.9 10*3/uL (ref 4.0–10.5)

## 2012-09-10 LAB — TROPONIN I: Troponin I: <0.3 ng/mL (ref <0.30)

## 2012-09-10 LAB — D-DIMER, QUANTITATIVE: D-Dimer, Quant: 1.63 ug/mL-FEU — ABNORMAL HIGH (ref 0.00–0.48)

## 2012-09-10 MED ORDER — ALBUTEROL SULFATE (5 MG/ML) 0.5% IN NEBU
2.5000 mg | INHALATION_SOLUTION | Freq: Once | RESPIRATORY_TRACT | Status: AC
  Administered 2012-09-10: 2.5 mg via RESPIRATORY_TRACT
  Filled 2012-09-10: qty 0.5

## 2012-09-10 MED ORDER — HYDROCODONE-HOMATROPINE 5-1.5 MG/5ML PO SYRP: 5.0000 mL | ORAL_SOLUTION | Freq: Four times a day (QID) | ORAL | Status: DC | PRN

## 2012-09-10 MED ORDER — PREDNISONE 20 MG PO TABS: ORAL_TABLET | ORAL | Status: DC

## 2012-09-10 MED ORDER — IOHEXOL 350 MG/ML SOLN
100.0000 mL | Freq: Once | INTRAVENOUS | Status: AC | PRN
  Administered 2012-09-10: 100 mL via INTRAVENOUS

## 2012-09-10 NOTE — ED Notes (Signed)
Critical sodium of 117, EDP notified.

## 2012-09-10 NOTE — ED Notes (Signed)
Pt presents to department for evaluation of chest pain and cough. Ongoing for several weeks, was seen at Urgent Care on 5/9 and diagnosed with bronchitis. States no relief of symptoms with antibiotics and mucinex. 5/10 pain at the time, states pain becomes worse when laying flat. Respirations unlabored. Pt is alert and oriented x4.

## 2012-09-10 NOTE — ED Provider Notes (Signed)
History     CSN: [id number]  Arrival date & time 09/10/12  5784   First MD Initiated Contact with Patient 09/10/12 0845      Chief Complaint  Patient presents with  . Chest Pain    (Consider location/radiation/quality/duration/timing/severity/associated sxs/prior treatment) HPI Comments: Patient comes to the ER for evaluation of chest pain. Patient reports that she has been sick for more than a week. She was seen at urgent care for cough and chest congestion, diagnosed with bronchitis a week ago. She is on Augmentin and has been prescribed albuterol, Tussionex and prednisone. She reports that the cough has improved, but since last night she has been having discomfort in the Center of her chest. She reports it as a burning sensation improve if she gets up and walks or drinks Coke and belches. She reports that the pain does worsen when she takes a deep breath. She feels slightly short of breath. Has not used her albuterol in several days.  Patient is a 67 y.o. female presenting with chest pain.  Chest Pain Associated symptoms: cough and shortness of breath     Past Medical History  Diagnosis Date  . Hypertension   . Arthritis     Past Surgical History  Procedure Laterality Date  . Replacement total knee      No family history on file.  History  Substance Use Topics  . Smoking status: Never Smoker   . Smokeless tobacco: Not on file  . Alcohol Use: No    OB History   Grav Para Term Preterm Abortions TAB SAB Ect Mult Living                  Review of Systems  Respiratory: Positive for cough and shortness of breath.   Cardiovascular: Positive for chest pain.  All other systems reviewed and are negative.    Allergies  Codeine and Hydrochlorothiazide  Home Medications   Current Outpatient Rx  Name  Route  Sig  Dispense  Refill  . albuterol (PROVENTIL HFA;VENTOLIN HFA) 108 (90 BASE) MCG/ACT inhaler   Inhalation   Inhale 1-2 puffs into the lungs every 6 (six)  hours as needed for wheezing.   1 Inhaler   0   . amoxicillin-clavulanate (AUGMENTIN) 875-125 MG per tablet   Oral   Take 1 tablet by mouth 2 (two) times daily.   20 tablet   0   . chlorpheniramine-HYDROcodone (TUSSIONEX) 10-8 MG/5ML LQCR   Oral   Take 5 mLs by mouth every 12 (twelve) hours as needed.   140 mL   0   . PredniSONE 5 MG KIT   Oral   Take 1 kit (5 mg total) by mouth daily after breakfast. Prednisone 5 mg 6 day dosepack.  Take as directed.   1 kit   0     There were no vitals taken for this visit.  Physical Exam  Constitutional: She is oriented to person, place, and time. She appears well-developed and well-nourished. No distress.  HENT:  Head: Normocephalic and atraumatic.  Right Ear: Hearing normal.  Left Ear: Hearing normal.  Nose: Nose normal.  Mouth/Throat: Oropharynx is clear and moist and mucous membranes are normal.  Eyes: Conjunctivae and EOM are normal. Pupils are equal, round, and reactive to light.  Neck: Normal range of motion. Neck supple.  Cardiovascular: Regular rhythm, S1 normal and S2 normal.  Exam reveals no gallop and no friction rub.   No murmur heard. Pulmonary/Chest: Effort normal and breath sounds  normal. No respiratory distress. She exhibits no tenderness.  Abdominal: Soft. Normal appearance and bowel sounds are normal. There is no hepatosplenomegaly. There is no tenderness. There is no rebound, no guarding, no tenderness at McBurney's point and negative Murphy's sign. No hernia.  Musculoskeletal: Normal range of motion.  Neurological: She is alert and oriented to person, place, and time. She has normal strength. No cranial nerve deficit or sensory deficit. Coordination normal. GCS eye subscore is 4. GCS verbal subscore is 5. GCS motor subscore is 6.  Skin: Skin is warm, dry and intact. No rash noted. No cyanosis.  Psychiatric: She has a normal mood and affect. Her speech is normal and behavior is normal. Thought content normal.    ED  Course  Procedures (including critical care time)  EKG:  Date: 09/10/2012  Rate: 82     Rhythm: normal sinus rhythm  QRS Axis: normal  Intervals: normal  ST/T Wave abnormalities: normal  Conduction Disutrbances:none  Narrative Interpretation:   Old EKG Reviewed: none available    Labs Reviewed  CBC WITH DIFFERENTIAL - Abnormal; Notable for the following:    HCT 35.3 (*)    MCHC 37.1 (*)    All other components within normal limits  COMPREHENSIVE METABOLIC PANEL - Abnormal; Notable for the following:    Sodium 117 (*)    Chloride 80 (*)    Glucose, Bld 110 (*)    Alkaline Phosphatase 131 (*)    All other components within normal limits  D-DIMER, QUANTITATIVE - Abnormal; Notable for the following:    D-Dimer, Quant 1.63 (*)    All other components within normal limits  TROPONIN I  PRO B NATRIURETIC PEPTIDE  LIPASE, BLOOD   Dg Chest 2 View  09/10/2012   *RADIOLOGY REPORT*  Clinical Data: Cough and chest pain  CHEST - 2 VIEW  Comparison: 09/02/2012  Findings: Normal heart size.  Hyperaerated lungs.  Stable thoracic spine.  No pleural effusion.  No pneumothorax.  IMPRESSION: Stable.  No active cardiopulmonary disease.  Hyperaeration.   Original Report Authenticated By: Jolaine Click, M.D.   Ct Angio Chest Pe W/cm &/or Wo Cm  09/10/2012   *RADIOLOGY REPORT*  Clinical Data: Pleuritic chest pain.  Elevated D-dimer.  Evaluate for pulmonary embolism.  CT ANGIOGRAPHY CHEST  Technique:  Multidetector CT imaging of the chest using the standard protocol during bolus administration of intravenous contrast. Multiplanar reconstructed images including MIPs were obtained and reviewed to evaluate the vascular anatomy.  Contrast: OMNIPAQUE IOHEXOL 350 MG/ML SOLN  Comparison: Chest radiograph 09/10/2012  Findings: 6 mm low density nodule in the right thyroid lobe.  Technically very good evaluation of the pulmonary arteries.  No filling defects are identified in the pulmonary arteries. The  thoracic aorta is normal in caliber, contour, and enhancement. Negative for thoracic aortic dissection or aneurysm.  Mild cardiomegaly.  Small hiatal hernia.  Esophagus unremarkable. Negative for lymphadenopathy.  Negative for pleural or pericardial effusion.  There is dependent atelectasis in both lower lobes.  Otherwise, the lungs are clear.  No airspace disease or interstitial abnormality. The trachea and bronchi are patent.  Thoracic spine vertebral bodies are normal in alignment.  There is a mild superior endplate compression fracture of the T7 vertebral body, unchanged.  IMPRESSION:  1.  Negative for pulmonary embolism. 2.  Mild cardiomegaly. 3.  Small hiatal hernia. 4.  Dependent atelectasis in the lower lobes.  No acute findings in the lungs. 5.  6 mm right thyroid lobe nodule.  Outpatient thyroid ultrasound could be considered for further evaluation.   Original Report Authenticated By: Britta Mccreedy, M.D.     Diagnosis: 1. Bronchitis 2. Pleurisy    MDM  Patient comes to the ER for evaluation of chest pain. She has been ongoing treatment for bronchitis for more than a week. Patient reports increasing pain in the chest that is sharp in nature, worse with coughing and breathing. Lungs are clear to auscultation and by chest x-ray. Patient's cardiac workup has been unrevealing. She has had persistent pain for several days and it is atypical for acute coronary syndrome. EKG did not show any ischemic changes, troponin negative. Symptoms felt to be more consistent with pulmonary etiology. D-dimer was elevated and therefore CT angiography performed. No PE is seen. Symptoms most consistent with pleurisy secondary to the recent bronchitis. No further antibiotic needed. Patient has not been using her albuterol, was encouraged to use it every 4 hours. She was tapering dose of prednisone, will restart a more lengthy tapering course and add hydrocodone.        Gilda Crease, MD 09/10/12 1147

## 2012-09-10 NOTE — ED Notes (Signed)
Respiratory at bedside to begin nebulizer treatment.

## 2012-09-12 DIAGNOSIS — E871 Hypo-osmolality and hyponatremia: Secondary | ICD-10-CM | POA: Diagnosis not present

## 2012-09-12 DIAGNOSIS — J40 Bronchitis, not specified as acute or chronic: Secondary | ICD-10-CM | POA: Diagnosis not present

## 2012-09-14 DIAGNOSIS — E871 Hypo-osmolality and hyponatremia: Secondary | ICD-10-CM | POA: Diagnosis not present

## 2012-09-20 DIAGNOSIS — R946 Abnormal results of thyroid function studies: Secondary | ICD-10-CM | POA: Diagnosis not present

## 2012-09-20 DIAGNOSIS — E871 Hypo-osmolality and hyponatremia: Secondary | ICD-10-CM | POA: Diagnosis not present

## 2012-09-23 DIAGNOSIS — R05 Cough: Secondary | ICD-10-CM | POA: Diagnosis not present

## 2012-09-23 DIAGNOSIS — R059 Cough, unspecified: Secondary | ICD-10-CM | POA: Diagnosis not present

## 2012-09-23 DIAGNOSIS — E871 Hypo-osmolality and hyponatremia: Secondary | ICD-10-CM | POA: Diagnosis not present

## 2012-09-27 DIAGNOSIS — Z1211 Encounter for screening for malignant neoplasm of colon: Secondary | ICD-10-CM | POA: Diagnosis not present

## 2012-10-05 DIAGNOSIS — I10 Essential (primary) hypertension: Secondary | ICD-10-CM | POA: Diagnosis not present

## 2012-10-05 DIAGNOSIS — I2699 Other pulmonary embolism without acute cor pulmonale: Secondary | ICD-10-CM | POA: Diagnosis not present

## 2012-10-05 DIAGNOSIS — E871 Hypo-osmolality and hyponatremia: Secondary | ICD-10-CM | POA: Diagnosis not present

## 2012-10-05 DIAGNOSIS — J988 Other specified respiratory disorders: Secondary | ICD-10-CM | POA: Diagnosis not present

## 2012-10-07 DIAGNOSIS — R059 Cough, unspecified: Secondary | ICD-10-CM | POA: Diagnosis not present

## 2012-10-07 DIAGNOSIS — R05 Cough: Secondary | ICD-10-CM | POA: Diagnosis not present

## 2012-10-07 DIAGNOSIS — Z1331 Encounter for screening for depression: Secondary | ICD-10-CM | POA: Diagnosis not present

## 2012-10-26 DIAGNOSIS — R946 Abnormal results of thyroid function studies: Secondary | ICD-10-CM | POA: Diagnosis not present

## 2012-10-26 DIAGNOSIS — R7989 Other specified abnormal findings of blood chemistry: Secondary | ICD-10-CM | POA: Diagnosis not present

## 2012-11-03 ENCOUNTER — Encounter: Payer: Self-pay | Admitting: Pulmonary Disease

## 2012-11-03 ENCOUNTER — Telehealth: Payer: Self-pay | Admitting: Pulmonary Disease

## 2012-11-03 ENCOUNTER — Other Ambulatory Visit: Payer: Self-pay | Admitting: Pulmonary Disease

## 2012-11-03 ENCOUNTER — Ambulatory Visit (INDEPENDENT_AMBULATORY_CARE_PROVIDER_SITE_OTHER): Payer: Medicare Other | Admitting: Pulmonary Disease

## 2012-11-03 VITALS — BP 132/90 | HR 96 | Temp 97.9°F | Ht 63.75 in | Wt 145.0 lb

## 2012-11-03 DIAGNOSIS — R05 Cough: Secondary | ICD-10-CM | POA: Diagnosis not present

## 2012-11-03 DIAGNOSIS — R053 Chronic cough: Secondary | ICD-10-CM

## 2012-11-03 DIAGNOSIS — R059 Cough, unspecified: Secondary | ICD-10-CM

## 2012-11-03 MED ORDER — MOMETASONE FUROATE 50 MCG/ACT NA SUSP: 2.0000 | Freq: Every day | NASAL | Status: DC

## 2012-11-03 MED ORDER — BENZONATATE 100 MG PO CAPS: 100.0000 mg | ORAL_CAPSULE | Freq: Four times a day (QID) | ORAL | Status: DC | PRN

## 2012-11-03 MED ORDER — PANTOPRAZOLE SODIUM 40 MG PO TBEC: 40.0000 mg | DELAYED_RELEASE_TABLET | Freq: Two times a day (BID) | ORAL | Status: DC

## 2012-11-03 NOTE — Progress Notes (Signed)
  Subjective:    Patient ID: Erika [name], female    DOB: January 14, 1946, 67 y.o.   MRN: [id number]  HPI The patient is a 67 year old female who I've been asked to see for chronic cough.  The patient was in her usual state of health until the end of taper when she began to develop a dry hacking cough.  There was no upper respiratory infection or viral prodrome.  She did have nasal congestion and postnasal drip at that time.  The cough persisted, and increased in severity, and ultimately she was seen at urgent care where she was treated with an antibiotic and prednisone.  She saw a little bit of improvement, but not significant, and did not resolve.  Ultimately she presented to the emergency room with chest discomfort after a cough paroxysm.  She had a CT of her chest that showed no pulmonary embolus, minimal basilar atelectasis, and a small hiatal hernia.  There was no acute process.  The patient's cough has persisted, and currently is an 8/10.  It is primarily dry, but can sometimes produce nonpurulent mucus.  The patient thinks it is more productive, but she is unable to produce any mucus.  She feels this gets "stuck in my throat".  She denies a globus sensation, but admits to frequent throat clearing.  She does have persistent nasal congestion and postnasal drip, but only has occasional reflux.  She is taking an over-the-counter proton pump inhibitor per her doctor's instructions.  She has no history of asthma, and is really unsure if her exertional tolerance is changed from baseline.  It should be noted that she has been on lisinopril for hypertension, has been off this for only one week.   Review of Systems  Constitutional: Negative for fever and unexpected weight change.  HENT: Positive for congestion, rhinorrhea, sneezing, postnasal drip and sinus pressure. Negative for ear pain, nosebleeds, sore throat, trouble swallowing and dental problem.        Allergies  Eyes: Negative for redness and itching.   Respiratory: Positive for cough and shortness of breath. Negative for chest tightness and wheezing.   Cardiovascular: Positive for chest pain ( left chest--h/o pleurisy). Negative for palpitations and leg swelling.  Gastrointestinal: Negative for nausea and vomiting.  Genitourinary: Negative for dysuria.  Musculoskeletal: Negative for joint swelling.  Skin: Negative for rash.  Neurological: Negative for headaches.  Hematological: Does not bruise/bleed easily.  Psychiatric/Behavioral: Negative for dysphoric mood. The patient is not nervous/anxious.        Objective:   Physical Exam Constitutional:  Well developed, no acute distress  HENT:  Nares patent without discharge, but swollen nasal mucosa that is erythematous.  Oropharynx without exudate, palate and uvula are normal  Eyes:  Perrla, eomi, no scleral icterus  Neck:  No JVD, no TMG  Cardiovascular:  Normal rate, regular rhythm, no rubs or gallops.  No murmurs        Intact distal pulses  Pulmonary :  Normal breath sounds, no stridor or respiratory distress   No rales, rhonchi, or wheezing  Abdominal:  Soft, nondistended, bowel sounds present.  No tenderness noted.   Musculoskeletal:  No lower extremity edema noted.  Lymph Nodes:  No cervical lymphadenopathy noted  Skin:  No cyanosis noted  Neurologic:  Alert, appropriate, moves all 4 extremities without obvious deficit.         Assessment & Plan:

## 2012-11-03 NOTE — Assessment & Plan Note (Signed)
The patient has a chronic cough since April of this year, and I suspect that it is upper airway in origin.  She is having persistent nasal symptoms and postnasal drip, and is also irritating her upper airway with throat clearing.  She may also have a component of reflux, with a hiatal hernia noted on her CT chest from May.  I would find it very unlikely to have a lower airway problem given her CT and also her normal spirometry today.  However, I cannot 100% exclude the possibility of cough variant asthma.  I would like to treat her more aggressively for upper airway causes of cough, including postnasal drip and reflux.  It will take approximately 8 weeks for her ACE inhibitor to be completely out of her system, and I hope that her cough will continue to resolve over this period of time.

## 2012-11-03 NOTE — Telephone Encounter (Signed)
Spoke to pt. I have scheduled her to see TP on 11/18/2012 at 9:15am.

## 2012-11-03 NOTE — Patient Instructions (Addendum)
Will treat with protonix 40mg  one in am and pm for next 4 weeks.  Stop coricidin, and take chlorpheniramine 4mg  OTC, 2 at bedtime each night. Start nasonex, 2 sprays each nostril each am.  Stay off lisinopril.  This will usually take 8 weeks to get completely out of your system. No throat clearing during the day.  Use hard candy (no mint or menthol, no cough drops) all during the day to prevent cough and throat clearing. Limit voice use as much as possible.  No prolonged conversation or yelling/singing. Tessalon pearls 100mg , take 2 every 6 hrs for cough. followup with me in 2 weeks to check on progress.

## 2012-11-04 DIAGNOSIS — I10 Essential (primary) hypertension: Secondary | ICD-10-CM | POA: Diagnosis not present

## 2012-11-04 DIAGNOSIS — R05 Cough: Secondary | ICD-10-CM | POA: Diagnosis not present

## 2012-11-04 DIAGNOSIS — R059 Cough, unspecified: Secondary | ICD-10-CM | POA: Diagnosis not present

## 2012-11-16 ENCOUNTER — Other Ambulatory Visit (HOSPITAL_COMMUNITY)
Admission: RE | Admit: 2012-11-16 | Discharge: 2012-11-16 | Disposition: A | Discharge status: Home / Self Care | Payer: Medicare Other | Source: Ambulatory Visit | Attending: Family Medicine | Admitting: Family Medicine

## 2012-11-16 ENCOUNTER — Other Ambulatory Visit: Payer: Self-pay | Admitting: Family Medicine

## 2012-11-16 DIAGNOSIS — Z124 Encounter for screening for malignant neoplasm of cervix: Secondary | ICD-10-CM | POA: Diagnosis not present

## 2012-11-16 DIAGNOSIS — N951 Menopausal and female climacteric states: Secondary | ICD-10-CM | POA: Diagnosis not present

## 2012-11-16 DIAGNOSIS — Z0000 Encounter for general adult medical examination without abnormal findings: Secondary | ICD-10-CM | POA: Diagnosis not present

## 2012-11-16 DIAGNOSIS — I10 Essential (primary) hypertension: Secondary | ICD-10-CM | POA: Diagnosis not present

## 2012-11-16 DIAGNOSIS — Z136 Encounter for screening for cardiovascular disorders: Secondary | ICD-10-CM | POA: Diagnosis not present

## 2012-11-18 ENCOUNTER — Ambulatory Visit (INDEPENDENT_AMBULATORY_CARE_PROVIDER_SITE_OTHER): Payer: Medicare Other | Admitting: Adult Health

## 2012-11-18 ENCOUNTER — Encounter: Payer: Self-pay | Admitting: Adult Health

## 2012-11-18 VITALS — BP 136/76 | HR 71 | Temp 98.8°F | Ht 63.75 in | Wt 144.6 lb

## 2012-11-18 DIAGNOSIS — R05 Cough: Secondary | ICD-10-CM | POA: Diagnosis not present

## 2012-11-18 DIAGNOSIS — R059 Cough, unspecified: Secondary | ICD-10-CM

## 2012-11-18 DIAGNOSIS — R053 Chronic cough: Secondary | ICD-10-CM

## 2012-11-18 MED ORDER — BENZONATATE 100 MG PO CAPS: 100.0000 mg | ORAL_CAPSULE | Freq: Four times a day (QID) | ORAL | Status: DC | PRN

## 2012-11-18 NOTE — Progress Notes (Signed)
Subjective:    Patient ID: Erika [name], female    DOB: 08-Nov-1945, 67 y.o.   MRN: [id number]  HPI The patient is a 67 year old female who I've been asked to see for chronic cough 11/03/12   11/03/12 IOV   The patient was in her usual state of health until the end of taper when she began to develop a dry hacking cough.  There was no upper respiratory infection or viral prodrome.  She did have nasal congestion and postnasal drip at that time.  The cough persisted, and increased in severity, and ultimately she was seen at urgent care where she was treated with an antibiotic and prednisone.  She saw a little bit of improvement, but not significant, and did not resolve.  Ultimately she presented to the emergency room with chest discomfort after a cough paroxysm.  She had a CT of her chest that showed no pulmonary embolus, minimal basilar atelectasis, and a small hiatal hernia.  There was no acute process.  The patient's cough has persisted, and currently is an 8/10.  It is primarily dry, but can sometimes produce nonpurulent mucus.  The patient thinks it is more productive, but she is unable to produce any mucus.  She feels this gets "stuck in my throat".  She denies a globus sensation, but admits to frequent throat clearing.  She does have persistent nasal congestion and postnasal drip, but only has occasional reflux.  She is taking an over-the-counter proton pump inhibitor per her doctor's instructions.  She has no history of asthma, and is really unsure if her exertional tolerance is changed from baseline.  It should be noted that she has been on lisinopril for hypertension, has been off this for only one week. >>ACE stopped , cyclical cough    11/18/2012 Follow up  2 week follow up cough - reports cough is 90% improved cough control regimen. Does still have PND, throat clearing, congestion in the back of throat, some sore throat.    Patient was seen 2 weeks ago for initial pulmonary consult for cyclical  cough x3 months. Patient was started on a cough suppression regimen with Tessalon Perles. Along with reflux, and rhinitis prevention regimen with Chlor-Trimeton and Protonix. Her ACE inhibitor was stopped. Patient reports that she is much improved. Cough has dramatically decreased. Patient denies any hemoptysis, orthopnea, PND, or leg swelling.    Review of Systems Constitutional:   No  weight loss, night sweats,  Fevers, chills, fatigue, or  lassitude.  HEENT:   No headaches,  Difficulty swallowing,  Tooth/dental problems, or  Sore throat,                No sneezing, itching, ear ache, + nasal congestion, post nasal drip,   CV:  No chest pain,  Orthopnea, PND, swelling in lower extremities, anasarca, dizziness, palpitations, syncope.   GI  No heartburn, indigestion, abdominal pain, nausea, vomiting, diarrhea, change in bowel habits, loss of appetite, bloody stools.   Resp:    No chest wall deformity  Skin: no rash or lesions.  GU: no dysuria, change in color of urine, no urgency or frequency.  No flank pain, no hematuria   MS:  No joint pain or swelling.  No decreased range of motion.  No back pain.  Psych:  No change in mood or affect. No depression or anxiety.  No memory loss.         Objective:   Physical Exam GEN: A/Ox3; pleasant , NAD, well nourished  HEENT:  [place]/AT,  EACs-clear, TMs-wnl, NOSE-clear, THROAT-clear, no lesions, no postnasal drip or exudate noted.   NECK:  Supple w/ fair ROM; no JVD; normal carotid impulses w/o bruits; no thyromegaly or nodules palpated; no lymphadenopathy.  RESP  Clear  P & A; w/o, wheezes/ rales/ or rhonchi.no accessory muscle use, no dullness to percussion  CARD:  RRR, no m/r/g  , no peripheral edema, pulses intact, no cyanosis or clubbing.  GI:   Soft & nt; nml bowel sounds; no organomegaly or masses detected.  Musco: Warm bil, no deformities or joint swelling noted.   Neuro: alert, no focal deficits noted.    Skin: Warm, no  lesions or rashes        Assessment & Plan:

## 2012-11-18 NOTE — Assessment & Plan Note (Signed)
Improved cough off ACE Inhibitor with treatment aimed at GERD/AR prevention along with cough suppression regimen   Plan  Continue on protonix 40mg  one in am and pm for next 4 weeks. Then back to once daily Continue on chlorpheniramine 4mg  OTC, 2 at bedtime each night. Continue on  nasonex, 2 sprays each nostril each am.  Avoid Ace Inhibitors in future  No throat clearing during the day.  Use hard candy (no mint or menthol, no cough drops) all during the day to prevent cough and throat clearing. Limit voice use as much as possible.  No prolonged conversation or yelling/singing. Tessalon pearls 100mg , take 1 every 6 hrs ,As needed  for cough. follow up Dr. Shelle Iron in 2 months and As needed

## 2012-11-18 NOTE — Patient Instructions (Addendum)
Continue on protonix 40mg  one in am and pm for next 4 weeks. Then back to once daily Continue on chlorpheniramine 4mg  OTC, 2 at bedtime each night. Continue on  nasonex, 2 sprays each nostril each am.  Avoid Ace Inhibitors in future  No throat clearing during the day.  Use hard candy (no mint or menthol, no cough drops) all during the day to prevent cough and throat clearing. Limit voice use as much as possible.  No prolonged conversation or yelling/singing. Tessalon pearls 100mg , take 1 every 6 hrs ,As needed  for cough. follow up Dr. Shelle Iron in 2 months and As needed

## 2012-12-01 ENCOUNTER — Telehealth: Payer: Self-pay | Admitting: *Deleted

## 2012-12-01 NOTE — Telephone Encounter (Signed)
Called to initiate PA for Pantoprazole 40mg  tabs. Insurance has denied and has refused to cover med d/t dx being chronic cough.  Patient states that she had to pay OOP this last Rx and it cost her $85 for a 30 day supply.  Please advise of a medication that might be cheaper. Insurance company had no alternatives d/t them not understanding the reasoning behind pt being rxd this medication for cough--I tried explaining to them and they said our dx was not acceptable and there were no medications that could be offered.  Patient also states that her Nasonex is not covered either and she is wanting to be switched back to Flonase.   [facility] [address]  Dr Shelle Iron please advise on both meds for patient is going to be out of meds as of 12/05/12 and only wanted [facility] addressing this message. Thanks.

## 2012-12-05 NOTE — Telephone Encounter (Signed)
The flonase is fine Make sure we are using the diagnosis of laryngopharyngeal reflux when we talk with insurance company.  I have never seen a company not have a PPI on formulary for this dx.

## 2012-12-06 ENCOUNTER — Other Ambulatory Visit: Payer: Self-pay | Admitting: Pulmonary Disease

## 2012-12-06 MED ORDER — OMEPRAZOLE 20 MG PO CPDR: 20.0000 mg | DELAYED_RELEASE_CAPSULE | Freq: Two times a day (BID) | ORAL | Status: DC

## 2012-12-06 MED ORDER — OMEPRAZOLE 20 MG PO CPDR: 40.0000 mg | DELAYED_RELEASE_CAPSULE | Freq: Two times a day (BID) | ORAL | Status: DC

## 2012-12-06 MED ORDER — FLUTICASONE PROPIONATE 50 MCG/ACT NA SUSP: 2.0000 | Freq: Every day | NASAL | Status: DC

## 2012-12-06 NOTE — Telephone Encounter (Signed)
Pt called to check on status of pantoprazole as she "really needs this asap". Call her at work [phone]. OK per pt to [facility] on this phone. Hazel Sams

## 2012-12-06 NOTE — Telephone Encounter (Signed)
Flonase sent to pt pharmacy.  Flonase 2 sprays daily into each nare

## 2012-12-06 NOTE — Telephone Encounter (Signed)
New medication : omep 20mg  2 bid #120 x 6   Pt aware

## 2012-12-06 NOTE — Telephone Encounter (Signed)
Spoke with [facility]. They state that Pantoprazole will not be covered until the pt tries and fails both Dexilant and Nexium. Both of these medications will require a prior auth as well. Omeprazole is on the preferred list of medications, as long as it's the 20mg  capsules. If she is prescribed Omeprazole she will need #120 2 caps BID.  KC - are you okay with changing the pt to Omeprazole? Please advise.

## 2012-12-06 NOTE — Telephone Encounter (Addendum)
Called [phone] [employer] [facility]---unable to complete PA d/t working with doctor this morning. Will have to try again later.   Will ask Triage if able to assist me in this process of getting this medication approved d/t long wait times on the phone I have not been able to complete the PA process after multiple attempts this morning.   Specific dx to be given: Laryngopharyngeal Reflux 478.79  Medication for PA: Pantoprazole 40mg  Take 1 tablet twice daily #60  Flonase sent to pt pharm--[facility]

## 2012-12-06 NOTE — Telephone Encounter (Signed)
Omeprazole 20mg  2 bid is ok.

## 2012-12-28 ENCOUNTER — Encounter: Payer: Self-pay | Admitting: Pulmonary Disease

## 2013-01-20 ENCOUNTER — Ambulatory Visit: Payer: Medicare Other | Admitting: Pulmonary Disease

## 2013-01-24 ENCOUNTER — Ambulatory Visit (INDEPENDENT_AMBULATORY_CARE_PROVIDER_SITE_OTHER): Payer: Medicare Other | Admitting: Pulmonary Disease

## 2013-01-24 ENCOUNTER — Encounter: Payer: Self-pay | Admitting: Pulmonary Disease

## 2013-01-24 VITALS — BP 142/84 | HR 61 | Temp 97.9°F | Ht 65.5 in | Wt 145.6 lb

## 2013-01-24 DIAGNOSIS — R059 Cough, unspecified: Secondary | ICD-10-CM | POA: Diagnosis not present

## 2013-01-24 DIAGNOSIS — R05 Cough: Secondary | ICD-10-CM

## 2013-01-24 DIAGNOSIS — R053 Chronic cough: Secondary | ICD-10-CM

## 2013-01-24 NOTE — Patient Instructions (Addendum)
Decrease omeprazole 20mg  to 40mg  each am for 2 weeks, then discontinue and see how you do.  If you see return of cough, you may need to stay on this medication at a higher dose. Change OTC chlorpheniramine to as needed. followup with me as needed, but call if having return of cough issues.

## 2013-01-24 NOTE — Progress Notes (Signed)
  Subjective:    Patient ID: Erika [name], female    DOB: April 30, 1945, 67 y.o.   MRN: [id number]  HPI Patient comes in today for followup of her chronic cough.  She has been treated with b.i.d. Proton pump inhibitor, as well as aggressive therapy for postnasal drip.  She also had a longer period of time off her ACE inhibitor.  The patient tells me that her cough has pretty much resolved, and she is satisfied with her progress.   Review of Systems  Constitutional: Negative for fever and unexpected weight change.  HENT: Negative for ear pain, nosebleeds, congestion, sore throat, rhinorrhea, sneezing, trouble swallowing, dental problem, postnasal drip and sinus pressure.   Eyes: Negative for redness and itching.  Respiratory: Positive for cough. Negative for chest tightness, shortness of breath and wheezing.   Cardiovascular: Negative for palpitations and leg swelling.  Gastrointestinal: Negative for nausea and vomiting.  Genitourinary: Negative for dysuria.  Musculoskeletal: Negative for joint swelling.  Skin: Negative for rash.  Neurological: Negative for headaches.  Hematological: Does not bruise/bleed easily.  Psychiatric/Behavioral: Negative for dysphoric mood. The patient is not nervous/anxious.        Objective:   Physical Exam Well-developed female in no acute distress Nose without purulence or discharge noted Neck without lymphadenopathy or thyromegaly Lower extremities without edema, cyanosis Alert and oriented, moves all 4 extremities.       Assessment & Plan:

## 2013-01-24 NOTE — Assessment & Plan Note (Signed)
The patient states that her cough has almost totally resolved, and she is very pleased with her progress.  It is unclear how much of this was related to her ACE inhibitor, postnasal drip, or reflux disease.  I have asked her to cut her proton pump inhibitor dose in half for the next 2 weeks, then discontinue and see how she does.  If her cough returns, she may need to be on something on a daily basis.  I have also reminded her of her behavioral therapies in the event she gets a cold and her cough begins to escalate again.

## 2013-02-01 ENCOUNTER — Other Ambulatory Visit: Payer: Self-pay

## 2013-02-01 DIAGNOSIS — Z1231 Encounter for screening mammogram for malignant neoplasm of breast: Secondary | ICD-10-CM

## 2013-02-17 DIAGNOSIS — Z23 Encounter for immunization: Secondary | ICD-10-CM | POA: Diagnosis not present

## 2013-03-01 ENCOUNTER — Ambulatory Visit
Admission: RE | Admit: 2013-03-01 | Discharge: 2013-03-01 | Disposition: A | Discharge status: Home / Self Care | Payer: Medicare Other | Source: Ambulatory Visit

## 2013-03-01 DIAGNOSIS — Z1231 Encounter for screening mammogram for malignant neoplasm of breast: Secondary | ICD-10-CM | POA: Diagnosis not present

## 2013-05-18 DIAGNOSIS — I10 Essential (primary) hypertension: Secondary | ICD-10-CM | POA: Diagnosis not present

## 2013-05-18 DIAGNOSIS — J309 Allergic rhinitis, unspecified: Secondary | ICD-10-CM | POA: Diagnosis not present

## 2013-05-18 DIAGNOSIS — F411 Generalized anxiety disorder: Secondary | ICD-10-CM | POA: Diagnosis not present

## 2013-05-29 DIAGNOSIS — R300 Dysuria: Secondary | ICD-10-CM | POA: Diagnosis not present

## 2013-05-29 DIAGNOSIS — N390 Urinary tract infection, site not specified: Secondary | ICD-10-CM | POA: Diagnosis not present

## 2013-06-27 DIAGNOSIS — Z96659 Presence of unspecified artificial knee joint: Secondary | ICD-10-CM | POA: Diagnosis not present

## 2013-06-27 DIAGNOSIS — Z471 Aftercare following joint replacement surgery: Secondary | ICD-10-CM | POA: Diagnosis not present

## 2013-07-05 ENCOUNTER — Other Ambulatory Visit: Payer: Self-pay | Admitting: Pulmonary Disease

## 2013-08-03 ENCOUNTER — Other Ambulatory Visit: Payer: Self-pay | Admitting: Pulmonary Disease

## 2013-08-09 DIAGNOSIS — N390 Urinary tract infection, site not specified: Secondary | ICD-10-CM | POA: Diagnosis not present

## 2013-08-09 DIAGNOSIS — R300 Dysuria: Secondary | ICD-10-CM | POA: Diagnosis not present

## 2013-09-05 ENCOUNTER — Other Ambulatory Visit: Payer: Self-pay | Admitting: Pulmonary Disease

## 2013-12-04 DIAGNOSIS — Z79899 Other long term (current) drug therapy: Secondary | ICD-10-CM | POA: Diagnosis not present

## 2013-12-04 DIAGNOSIS — Z136 Encounter for screening for cardiovascular disorders: Secondary | ICD-10-CM | POA: Diagnosis not present

## 2013-12-04 DIAGNOSIS — J309 Allergic rhinitis, unspecified: Secondary | ICD-10-CM | POA: Diagnosis not present

## 2013-12-04 DIAGNOSIS — E871 Hypo-osmolality and hyponatremia: Secondary | ICD-10-CM | POA: Diagnosis not present

## 2013-12-04 DIAGNOSIS — I10 Essential (primary) hypertension: Secondary | ICD-10-CM | POA: Diagnosis not present

## 2013-12-04 DIAGNOSIS — J040 Acute laryngitis: Secondary | ICD-10-CM | POA: Diagnosis not present

## 2013-12-04 DIAGNOSIS — Z0000 Encounter for general adult medical examination without abnormal findings: Secondary | ICD-10-CM | POA: Diagnosis not present

## 2013-12-04 DIAGNOSIS — F411 Generalized anxiety disorder: Secondary | ICD-10-CM | POA: Diagnosis not present

## 2013-12-04 DIAGNOSIS — Z23 Encounter for immunization: Secondary | ICD-10-CM | POA: Diagnosis not present

## 2013-12-04 DIAGNOSIS — Z1331 Encounter for screening for depression: Secondary | ICD-10-CM | POA: Diagnosis not present

## 2013-12-30 ENCOUNTER — Other Ambulatory Visit: Payer: Self-pay | Admitting: Pulmonary Disease

## 2014-01-02 ENCOUNTER — Other Ambulatory Visit: Payer: Self-pay | Admitting: Dermatology

## 2014-01-02 DIAGNOSIS — C44319 Basal cell carcinoma of skin of other parts of face: Secondary | ICD-10-CM | POA: Diagnosis not present

## 2014-01-15 DIAGNOSIS — L259 Unspecified contact dermatitis, unspecified cause: Secondary | ICD-10-CM | POA: Diagnosis not present

## 2014-02-12 ENCOUNTER — Other Ambulatory Visit: Payer: Self-pay

## 2014-02-12 DIAGNOSIS — Z1231 Encounter for screening mammogram for malignant neoplasm of breast: Secondary | ICD-10-CM

## 2014-02-13 DIAGNOSIS — Z85828 Personal history of other malignant neoplasm of skin: Secondary | ICD-10-CM | POA: Diagnosis not present

## 2014-02-13 DIAGNOSIS — Z08 Encounter for follow-up examination after completed treatment for malignant neoplasm: Secondary | ICD-10-CM | POA: Diagnosis not present

## 2014-02-22 DIAGNOSIS — M6740 Ganglion, unspecified site: Secondary | ICD-10-CM | POA: Diagnosis not present

## 2014-02-28 DIAGNOSIS — M6740 Ganglion, unspecified site: Secondary | ICD-10-CM | POA: Diagnosis not present

## 2014-03-06 ENCOUNTER — Ambulatory Visit
Admission: RE | Admit: 2014-03-06 | Discharge: 2014-03-06 | Disposition: A | Discharge status: Home / Self Care | Payer: Medicare Other | Source: Ambulatory Visit

## 2014-03-06 ENCOUNTER — Encounter (INDEPENDENT_AMBULATORY_CARE_PROVIDER_SITE_OTHER): Payer: Self-pay

## 2014-03-06 DIAGNOSIS — Z1231 Encounter for screening mammogram for malignant neoplasm of breast: Secondary | ICD-10-CM | POA: Diagnosis not present

## 2014-03-07 DIAGNOSIS — M67432 Ganglion, left wrist: Secondary | ICD-10-CM | POA: Diagnosis not present

## 2014-03-08 DIAGNOSIS — E785 Hyperlipidemia, unspecified: Secondary | ICD-10-CM | POA: Diagnosis not present

## 2014-03-24 DIAGNOSIS — Z23 Encounter for immunization: Secondary | ICD-10-CM | POA: Diagnosis not present

## 2014-08-02 DIAGNOSIS — H25099 Other age-related incipient cataract, unspecified eye: Secondary | ICD-10-CM | POA: Diagnosis not present

## 2014-08-10 DIAGNOSIS — R300 Dysuria: Secondary | ICD-10-CM | POA: Diagnosis not present

## 2014-08-10 DIAGNOSIS — N390 Urinary tract infection, site not specified: Secondary | ICD-10-CM | POA: Diagnosis not present

## 2014-10-31 DIAGNOSIS — D1039 Benign neoplasm of other parts of mouth: Secondary | ICD-10-CM | POA: Diagnosis not present

## 2014-11-19 DIAGNOSIS — K1329 Other disturbances of oral epithelium, including tongue: Secondary | ICD-10-CM | POA: Diagnosis not present

## 2014-11-21 DIAGNOSIS — K1329 Other disturbances of oral epithelium, including tongue: Secondary | ICD-10-CM | POA: Diagnosis not present

## 2014-11-30 DIAGNOSIS — R8290 Unspecified abnormal findings in urine: Secondary | ICD-10-CM | POA: Diagnosis not present

## 2014-11-30 DIAGNOSIS — N390 Urinary tract infection, site not specified: Secondary | ICD-10-CM | POA: Diagnosis not present

## 2014-12-10 DIAGNOSIS — Z79899 Other long term (current) drug therapy: Secondary | ICD-10-CM | POA: Diagnosis not present

## 2014-12-10 DIAGNOSIS — E785 Hyperlipidemia, unspecified: Secondary | ICD-10-CM | POA: Diagnosis not present

## 2014-12-10 DIAGNOSIS — K219 Gastro-esophageal reflux disease without esophagitis: Secondary | ICD-10-CM | POA: Diagnosis not present

## 2014-12-10 DIAGNOSIS — I10 Essential (primary) hypertension: Secondary | ICD-10-CM | POA: Diagnosis not present

## 2014-12-10 DIAGNOSIS — F419 Anxiety disorder, unspecified: Secondary | ICD-10-CM | POA: Diagnosis not present

## 2014-12-10 DIAGNOSIS — Z0000 Encounter for general adult medical examination without abnormal findings: Secondary | ICD-10-CM | POA: Diagnosis not present

## 2015-01-02 DIAGNOSIS — Z1211 Encounter for screening for malignant neoplasm of colon: Secondary | ICD-10-CM | POA: Diagnosis not present

## 2015-02-04 DIAGNOSIS — Z23 Encounter for immunization: Secondary | ICD-10-CM | POA: Diagnosis not present

## 2015-02-13 ENCOUNTER — Other Ambulatory Visit: Payer: Self-pay

## 2015-02-13 DIAGNOSIS — Z1231 Encounter for screening mammogram for malignant neoplasm of breast: Secondary | ICD-10-CM

## 2015-02-22 DIAGNOSIS — M25531 Pain in right wrist: Secondary | ICD-10-CM | POA: Diagnosis not present

## 2015-03-13 DIAGNOSIS — E785 Hyperlipidemia, unspecified: Secondary | ICD-10-CM | POA: Diagnosis not present

## 2015-03-14 ENCOUNTER — Ambulatory Visit
Admission: RE | Admit: 2015-03-14 | Discharge: 2015-03-14 | Disposition: A | Discharge status: Home / Self Care | Payer: Medicare Other | Source: Ambulatory Visit

## 2015-03-14 DIAGNOSIS — Z1231 Encounter for screening mammogram for malignant neoplasm of breast: Secondary | ICD-10-CM

## 2015-03-15 DIAGNOSIS — M25531 Pain in right wrist: Secondary | ICD-10-CM | POA: Diagnosis not present

## 2015-03-28 DIAGNOSIS — Z96651 Presence of right artificial knee joint: Secondary | ICD-10-CM | POA: Diagnosis not present

## 2015-03-31 DIAGNOSIS — Z96651 Presence of right artificial knee joint: Secondary | ICD-10-CM | POA: Insufficient documentation

## 2015-04-08 DIAGNOSIS — M20011 Mallet finger of right finger(s): Secondary | ICD-10-CM | POA: Diagnosis not present

## 2015-04-08 DIAGNOSIS — M25531 Pain in right wrist: Secondary | ICD-10-CM | POA: Diagnosis not present

## 2015-04-10 DIAGNOSIS — M20011 Mallet finger of right finger(s): Secondary | ICD-10-CM | POA: Diagnosis not present

## 2015-04-10 DIAGNOSIS — S52551D Other extraarticular fracture of lower end of right radius, subsequent encounter for closed fracture with routine healing: Secondary | ICD-10-CM | POA: Diagnosis not present

## 2015-05-03 DIAGNOSIS — S52551D Other extraarticular fracture of lower end of right radius, subsequent encounter for closed fracture with routine healing: Secondary | ICD-10-CM | POA: Diagnosis not present

## 2015-05-03 DIAGNOSIS — M25531 Pain in right wrist: Secondary | ICD-10-CM | POA: Diagnosis not present

## 2015-05-03 DIAGNOSIS — M20011 Mallet finger of right finger(s): Secondary | ICD-10-CM | POA: Diagnosis not present

## 2015-05-20 DIAGNOSIS — M20011 Mallet finger of right finger(s): Secondary | ICD-10-CM | POA: Diagnosis not present

## 2015-05-21 DIAGNOSIS — M20011 Mallet finger of right finger(s): Secondary | ICD-10-CM | POA: Diagnosis not present

## 2015-08-14 DIAGNOSIS — M9902 Segmental and somatic dysfunction of thoracic region: Secondary | ICD-10-CM | POA: Diagnosis not present

## 2015-08-14 DIAGNOSIS — M47814 Spondylosis without myelopathy or radiculopathy, thoracic region: Secondary | ICD-10-CM | POA: Diagnosis not present

## 2015-08-14 DIAGNOSIS — M5032 Other cervical disc degeneration, mid-cervical region, unspecified level: Secondary | ICD-10-CM | POA: Diagnosis not present

## 2015-08-14 DIAGNOSIS — M9901 Segmental and somatic dysfunction of cervical region: Secondary | ICD-10-CM | POA: Diagnosis not present

## 2015-08-16 DIAGNOSIS — M47814 Spondylosis without myelopathy or radiculopathy, thoracic region: Secondary | ICD-10-CM | POA: Diagnosis not present

## 2015-08-16 DIAGNOSIS — M9902 Segmental and somatic dysfunction of thoracic region: Secondary | ICD-10-CM | POA: Diagnosis not present

## 2015-08-16 DIAGNOSIS — M5032 Other cervical disc degeneration, mid-cervical region, unspecified level: Secondary | ICD-10-CM | POA: Diagnosis not present

## 2015-08-16 DIAGNOSIS — M9901 Segmental and somatic dysfunction of cervical region: Secondary | ICD-10-CM | POA: Diagnosis not present

## 2015-08-21 DIAGNOSIS — M9902 Segmental and somatic dysfunction of thoracic region: Secondary | ICD-10-CM | POA: Diagnosis not present

## 2015-08-21 DIAGNOSIS — M5032 Other cervical disc degeneration, mid-cervical region, unspecified level: Secondary | ICD-10-CM | POA: Diagnosis not present

## 2015-08-21 DIAGNOSIS — M47814 Spondylosis without myelopathy or radiculopathy, thoracic region: Secondary | ICD-10-CM | POA: Diagnosis not present

## 2015-08-21 DIAGNOSIS — M9901 Segmental and somatic dysfunction of cervical region: Secondary | ICD-10-CM | POA: Diagnosis not present

## 2015-08-23 DIAGNOSIS — M9901 Segmental and somatic dysfunction of cervical region: Secondary | ICD-10-CM | POA: Diagnosis not present

## 2015-08-23 DIAGNOSIS — M5032 Other cervical disc degeneration, mid-cervical region, unspecified level: Secondary | ICD-10-CM | POA: Diagnosis not present

## 2015-08-23 DIAGNOSIS — M9902 Segmental and somatic dysfunction of thoracic region: Secondary | ICD-10-CM | POA: Diagnosis not present

## 2015-08-23 DIAGNOSIS — M47814 Spondylosis without myelopathy or radiculopathy, thoracic region: Secondary | ICD-10-CM | POA: Diagnosis not present

## 2015-08-28 DIAGNOSIS — M5032 Other cervical disc degeneration, mid-cervical region, unspecified level: Secondary | ICD-10-CM | POA: Diagnosis not present

## 2015-08-28 DIAGNOSIS — M9902 Segmental and somatic dysfunction of thoracic region: Secondary | ICD-10-CM | POA: Diagnosis not present

## 2015-08-28 DIAGNOSIS — M47814 Spondylosis without myelopathy or radiculopathy, thoracic region: Secondary | ICD-10-CM | POA: Diagnosis not present

## 2015-08-28 DIAGNOSIS — M9901 Segmental and somatic dysfunction of cervical region: Secondary | ICD-10-CM | POA: Diagnosis not present

## 2015-08-30 DIAGNOSIS — M9902 Segmental and somatic dysfunction of thoracic region: Secondary | ICD-10-CM | POA: Diagnosis not present

## 2015-08-30 DIAGNOSIS — M5032 Other cervical disc degeneration, mid-cervical region, unspecified level: Secondary | ICD-10-CM | POA: Diagnosis not present

## 2015-08-30 DIAGNOSIS — M9901 Segmental and somatic dysfunction of cervical region: Secondary | ICD-10-CM | POA: Diagnosis not present

## 2015-08-30 DIAGNOSIS — M47814 Spondylosis without myelopathy or radiculopathy, thoracic region: Secondary | ICD-10-CM | POA: Diagnosis not present

## 2015-09-03 DIAGNOSIS — M5032 Other cervical disc degeneration, mid-cervical region, unspecified level: Secondary | ICD-10-CM | POA: Diagnosis not present

## 2015-09-03 DIAGNOSIS — M47814 Spondylosis without myelopathy or radiculopathy, thoracic region: Secondary | ICD-10-CM | POA: Diagnosis not present

## 2015-09-03 DIAGNOSIS — M9901 Segmental and somatic dysfunction of cervical region: Secondary | ICD-10-CM | POA: Diagnosis not present

## 2015-09-03 DIAGNOSIS — M9902 Segmental and somatic dysfunction of thoracic region: Secondary | ICD-10-CM | POA: Diagnosis not present

## 2015-09-05 DIAGNOSIS — M47814 Spondylosis without myelopathy or radiculopathy, thoracic region: Secondary | ICD-10-CM | POA: Diagnosis not present

## 2015-09-05 DIAGNOSIS — M5032 Other cervical disc degeneration, mid-cervical region, unspecified level: Secondary | ICD-10-CM | POA: Diagnosis not present

## 2015-09-05 DIAGNOSIS — M9901 Segmental and somatic dysfunction of cervical region: Secondary | ICD-10-CM | POA: Diagnosis not present

## 2015-09-05 DIAGNOSIS — M9902 Segmental and somatic dysfunction of thoracic region: Secondary | ICD-10-CM | POA: Diagnosis not present

## 2015-09-11 DIAGNOSIS — M9901 Segmental and somatic dysfunction of cervical region: Secondary | ICD-10-CM | POA: Diagnosis not present

## 2015-09-11 DIAGNOSIS — M5032 Other cervical disc degeneration, mid-cervical region, unspecified level: Secondary | ICD-10-CM | POA: Diagnosis not present

## 2015-09-11 DIAGNOSIS — M47814 Spondylosis without myelopathy or radiculopathy, thoracic region: Secondary | ICD-10-CM | POA: Diagnosis not present

## 2015-09-11 DIAGNOSIS — M9902 Segmental and somatic dysfunction of thoracic region: Secondary | ICD-10-CM | POA: Diagnosis not present

## 2015-09-18 DIAGNOSIS — M47814 Spondylosis without myelopathy or radiculopathy, thoracic region: Secondary | ICD-10-CM | POA: Diagnosis not present

## 2015-09-18 DIAGNOSIS — M9902 Segmental and somatic dysfunction of thoracic region: Secondary | ICD-10-CM | POA: Diagnosis not present

## 2015-09-18 DIAGNOSIS — M5032 Other cervical disc degeneration, mid-cervical region, unspecified level: Secondary | ICD-10-CM | POA: Diagnosis not present

## 2015-09-18 DIAGNOSIS — M9901 Segmental and somatic dysfunction of cervical region: Secondary | ICD-10-CM | POA: Diagnosis not present

## 2015-09-25 DIAGNOSIS — M47814 Spondylosis without myelopathy or radiculopathy, thoracic region: Secondary | ICD-10-CM | POA: Diagnosis not present

## 2015-09-25 DIAGNOSIS — M5032 Other cervical disc degeneration, mid-cervical region, unspecified level: Secondary | ICD-10-CM | POA: Diagnosis not present

## 2015-09-25 DIAGNOSIS — M9901 Segmental and somatic dysfunction of cervical region: Secondary | ICD-10-CM | POA: Diagnosis not present

## 2015-09-25 DIAGNOSIS — M9902 Segmental and somatic dysfunction of thoracic region: Secondary | ICD-10-CM | POA: Diagnosis not present

## 2015-10-02 DIAGNOSIS — M9902 Segmental and somatic dysfunction of thoracic region: Secondary | ICD-10-CM | POA: Diagnosis not present

## 2015-10-02 DIAGNOSIS — M47814 Spondylosis without myelopathy or radiculopathy, thoracic region: Secondary | ICD-10-CM | POA: Diagnosis not present

## 2015-10-02 DIAGNOSIS — M5032 Other cervical disc degeneration, mid-cervical region, unspecified level: Secondary | ICD-10-CM | POA: Diagnosis not present

## 2015-10-02 DIAGNOSIS — M9901 Segmental and somatic dysfunction of cervical region: Secondary | ICD-10-CM | POA: Diagnosis not present

## 2015-10-14 DIAGNOSIS — M47814 Spondylosis without myelopathy or radiculopathy, thoracic region: Secondary | ICD-10-CM | POA: Diagnosis not present

## 2015-10-14 DIAGNOSIS — M9902 Segmental and somatic dysfunction of thoracic region: Secondary | ICD-10-CM | POA: Diagnosis not present

## 2015-10-14 DIAGNOSIS — M5032 Other cervical disc degeneration, mid-cervical region, unspecified level: Secondary | ICD-10-CM | POA: Diagnosis not present

## 2015-10-14 DIAGNOSIS — M9901 Segmental and somatic dysfunction of cervical region: Secondary | ICD-10-CM | POA: Diagnosis not present

## 2015-11-04 DIAGNOSIS — M9902 Segmental and somatic dysfunction of thoracic region: Secondary | ICD-10-CM | POA: Diagnosis not present

## 2015-11-04 DIAGNOSIS — M47814 Spondylosis without myelopathy or radiculopathy, thoracic region: Secondary | ICD-10-CM | POA: Diagnosis not present

## 2015-11-04 DIAGNOSIS — M9901 Segmental and somatic dysfunction of cervical region: Secondary | ICD-10-CM | POA: Diagnosis not present

## 2015-11-04 DIAGNOSIS — M5032 Other cervical disc degeneration, mid-cervical region, unspecified level: Secondary | ICD-10-CM | POA: Diagnosis not present

## 2015-11-18 DIAGNOSIS — M9901 Segmental and somatic dysfunction of cervical region: Secondary | ICD-10-CM | POA: Diagnosis not present

## 2015-11-18 DIAGNOSIS — M9902 Segmental and somatic dysfunction of thoracic region: Secondary | ICD-10-CM | POA: Diagnosis not present

## 2015-11-18 DIAGNOSIS — M47814 Spondylosis without myelopathy or radiculopathy, thoracic region: Secondary | ICD-10-CM | POA: Diagnosis not present

## 2015-11-18 DIAGNOSIS — M5032 Other cervical disc degeneration, mid-cervical region, unspecified level: Secondary | ICD-10-CM | POA: Diagnosis not present

## 2015-12-09 DIAGNOSIS — M5032 Other cervical disc degeneration, mid-cervical region, unspecified level: Secondary | ICD-10-CM | POA: Diagnosis not present

## 2015-12-09 DIAGNOSIS — M9902 Segmental and somatic dysfunction of thoracic region: Secondary | ICD-10-CM | POA: Diagnosis not present

## 2015-12-09 DIAGNOSIS — M47814 Spondylosis without myelopathy or radiculopathy, thoracic region: Secondary | ICD-10-CM | POA: Diagnosis not present

## 2015-12-09 DIAGNOSIS — M9901 Segmental and somatic dysfunction of cervical region: Secondary | ICD-10-CM | POA: Diagnosis not present

## 2015-12-17 ENCOUNTER — Other Ambulatory Visit: Payer: Self-pay | Admitting: Family Medicine

## 2015-12-17 ENCOUNTER — Ambulatory Visit
Admission: RE | Admit: 2015-12-17 | Discharge: 2015-12-17 | Disposition: A | Discharge status: Home / Self Care | Payer: Medicare Other | Source: Ambulatory Visit | Attending: Family Medicine | Admitting: Family Medicine

## 2015-12-17 DIAGNOSIS — M5134 Other intervertebral disc degeneration, thoracic region: Secondary | ICD-10-CM | POA: Diagnosis not present

## 2015-12-17 DIAGNOSIS — N951 Menopausal and female climacteric states: Secondary | ICD-10-CM | POA: Diagnosis not present

## 2015-12-17 DIAGNOSIS — F419 Anxiety disorder, unspecified: Secondary | ICD-10-CM | POA: Diagnosis not present

## 2015-12-17 DIAGNOSIS — M8580 Other specified disorders of bone density and structure, unspecified site: Secondary | ICD-10-CM

## 2015-12-17 DIAGNOSIS — E7800 Pure hypercholesterolemia, unspecified: Secondary | ICD-10-CM | POA: Diagnosis not present

## 2015-12-17 DIAGNOSIS — K219 Gastro-esophageal reflux disease without esophagitis: Secondary | ICD-10-CM | POA: Diagnosis not present

## 2015-12-17 DIAGNOSIS — Z0001 Encounter for general adult medical examination with abnormal findings: Secondary | ICD-10-CM | POA: Diagnosis not present

## 2015-12-17 DIAGNOSIS — Z79899 Other long term (current) drug therapy: Secondary | ICD-10-CM | POA: Diagnosis not present

## 2015-12-17 DIAGNOSIS — I10 Essential (primary) hypertension: Secondary | ICD-10-CM | POA: Diagnosis not present

## 2015-12-17 DIAGNOSIS — M8588 Other specified disorders of bone density and structure, other site: Secondary | ICD-10-CM | POA: Diagnosis not present

## 2015-12-17 DIAGNOSIS — M5136 Other intervertebral disc degeneration, lumbar region: Secondary | ICD-10-CM | POA: Diagnosis not present

## 2015-12-25 DIAGNOSIS — Z1211 Encounter for screening for malignant neoplasm of colon: Secondary | ICD-10-CM | POA: Diagnosis not present

## 2016-01-20 DIAGNOSIS — M9902 Segmental and somatic dysfunction of thoracic region: Secondary | ICD-10-CM | POA: Diagnosis not present

## 2016-01-20 DIAGNOSIS — M5032 Other cervical disc degeneration, mid-cervical region, unspecified level: Secondary | ICD-10-CM | POA: Diagnosis not present

## 2016-01-20 DIAGNOSIS — Z780 Asymptomatic menopausal state: Secondary | ICD-10-CM | POA: Diagnosis not present

## 2016-01-20 DIAGNOSIS — M8588 Other specified disorders of bone density and structure, other site: Secondary | ICD-10-CM | POA: Diagnosis not present

## 2016-01-20 DIAGNOSIS — M47814 Spondylosis without myelopathy or radiculopathy, thoracic region: Secondary | ICD-10-CM | POA: Diagnosis not present

## 2016-01-20 DIAGNOSIS — M9901 Segmental and somatic dysfunction of cervical region: Secondary | ICD-10-CM | POA: Diagnosis not present

## 2016-01-27 DIAGNOSIS — M5032 Other cervical disc degeneration, mid-cervical region, unspecified level: Secondary | ICD-10-CM | POA: Diagnosis not present

## 2016-01-27 DIAGNOSIS — M9901 Segmental and somatic dysfunction of cervical region: Secondary | ICD-10-CM | POA: Diagnosis not present

## 2016-01-27 DIAGNOSIS — M9902 Segmental and somatic dysfunction of thoracic region: Secondary | ICD-10-CM | POA: Diagnosis not present

## 2016-01-27 DIAGNOSIS — M47814 Spondylosis without myelopathy or radiculopathy, thoracic region: Secondary | ICD-10-CM | POA: Diagnosis not present

## 2016-01-29 DIAGNOSIS — M9901 Segmental and somatic dysfunction of cervical region: Secondary | ICD-10-CM | POA: Diagnosis not present

## 2016-01-29 DIAGNOSIS — M9902 Segmental and somatic dysfunction of thoracic region: Secondary | ICD-10-CM | POA: Diagnosis not present

## 2016-01-29 DIAGNOSIS — M47814 Spondylosis without myelopathy or radiculopathy, thoracic region: Secondary | ICD-10-CM | POA: Diagnosis not present

## 2016-01-29 DIAGNOSIS — M5032 Other cervical disc degeneration, mid-cervical region, unspecified level: Secondary | ICD-10-CM | POA: Diagnosis not present

## 2016-02-06 DIAGNOSIS — M9902 Segmental and somatic dysfunction of thoracic region: Secondary | ICD-10-CM | POA: Diagnosis not present

## 2016-02-06 DIAGNOSIS — M47814 Spondylosis without myelopathy or radiculopathy, thoracic region: Secondary | ICD-10-CM | POA: Diagnosis not present

## 2016-02-06 DIAGNOSIS — M5032 Other cervical disc degeneration, mid-cervical region, unspecified level: Secondary | ICD-10-CM | POA: Diagnosis not present

## 2016-02-06 DIAGNOSIS — M9901 Segmental and somatic dysfunction of cervical region: Secondary | ICD-10-CM | POA: Diagnosis not present

## 2016-02-18 DIAGNOSIS — M542 Cervicalgia: Secondary | ICD-10-CM | POA: Diagnosis not present

## 2016-02-18 DIAGNOSIS — M75111 Incomplete rotator cuff tear or rupture of right shoulder, not specified as traumatic: Secondary | ICD-10-CM | POA: Diagnosis not present

## 2016-02-19 DIAGNOSIS — M25511 Pain in right shoulder: Secondary | ICD-10-CM | POA: Diagnosis not present

## 2016-02-19 DIAGNOSIS — R531 Weakness: Secondary | ICD-10-CM | POA: Diagnosis not present

## 2016-02-19 DIAGNOSIS — M542 Cervicalgia: Secondary | ICD-10-CM | POA: Diagnosis not present

## 2016-02-24 DIAGNOSIS — M542 Cervicalgia: Secondary | ICD-10-CM | POA: Diagnosis not present

## 2016-02-24 DIAGNOSIS — Z23 Encounter for immunization: Secondary | ICD-10-CM | POA: Diagnosis not present

## 2016-02-24 DIAGNOSIS — M25511 Pain in right shoulder: Secondary | ICD-10-CM | POA: Diagnosis not present

## 2016-02-24 DIAGNOSIS — R531 Weakness: Secondary | ICD-10-CM | POA: Diagnosis not present

## 2016-02-27 DIAGNOSIS — R531 Weakness: Secondary | ICD-10-CM | POA: Diagnosis not present

## 2016-02-27 DIAGNOSIS — M542 Cervicalgia: Secondary | ICD-10-CM | POA: Diagnosis not present

## 2016-02-27 DIAGNOSIS — M25511 Pain in right shoulder: Secondary | ICD-10-CM | POA: Diagnosis not present

## 2016-03-02 DIAGNOSIS — R531 Weakness: Secondary | ICD-10-CM | POA: Diagnosis not present

## 2016-03-02 DIAGNOSIS — M25511 Pain in right shoulder: Secondary | ICD-10-CM | POA: Diagnosis not present

## 2016-03-02 DIAGNOSIS — M542 Cervicalgia: Secondary | ICD-10-CM | POA: Diagnosis not present

## 2016-03-04 DIAGNOSIS — M542 Cervicalgia: Secondary | ICD-10-CM | POA: Diagnosis not present

## 2016-03-04 DIAGNOSIS — M25511 Pain in right shoulder: Secondary | ICD-10-CM | POA: Diagnosis not present

## 2016-03-04 DIAGNOSIS — R531 Weakness: Secondary | ICD-10-CM | POA: Diagnosis not present

## 2016-03-05 ENCOUNTER — Other Ambulatory Visit: Payer: Self-pay | Admitting: Family Medicine

## 2016-03-05 DIAGNOSIS — Z1231 Encounter for screening mammogram for malignant neoplasm of breast: Secondary | ICD-10-CM

## 2016-03-09 DIAGNOSIS — M25511 Pain in right shoulder: Secondary | ICD-10-CM | POA: Diagnosis not present

## 2016-03-09 DIAGNOSIS — R531 Weakness: Secondary | ICD-10-CM | POA: Diagnosis not present

## 2016-03-09 DIAGNOSIS — M542 Cervicalgia: Secondary | ICD-10-CM | POA: Diagnosis not present

## 2016-03-12 DIAGNOSIS — M25511 Pain in right shoulder: Secondary | ICD-10-CM | POA: Diagnosis not present

## 2016-03-12 DIAGNOSIS — M542 Cervicalgia: Secondary | ICD-10-CM | POA: Diagnosis not present

## 2016-03-12 DIAGNOSIS — R531 Weakness: Secondary | ICD-10-CM | POA: Diagnosis not present

## 2016-03-16 DIAGNOSIS — R531 Weakness: Secondary | ICD-10-CM | POA: Diagnosis not present

## 2016-03-16 DIAGNOSIS — M542 Cervicalgia: Secondary | ICD-10-CM | POA: Diagnosis not present

## 2016-03-16 DIAGNOSIS — M25511 Pain in right shoulder: Secondary | ICD-10-CM | POA: Diagnosis not present

## 2016-03-23 DIAGNOSIS — R531 Weakness: Secondary | ICD-10-CM | POA: Diagnosis not present

## 2016-03-23 DIAGNOSIS — M25511 Pain in right shoulder: Secondary | ICD-10-CM | POA: Diagnosis not present

## 2016-03-23 DIAGNOSIS — M542 Cervicalgia: Secondary | ICD-10-CM | POA: Diagnosis not present

## 2016-03-26 DIAGNOSIS — R531 Weakness: Secondary | ICD-10-CM | POA: Diagnosis not present

## 2016-03-26 DIAGNOSIS — M25511 Pain in right shoulder: Secondary | ICD-10-CM | POA: Diagnosis not present

## 2016-03-26 DIAGNOSIS — M542 Cervicalgia: Secondary | ICD-10-CM | POA: Diagnosis not present

## 2016-04-08 ENCOUNTER — Ambulatory Visit
Admission: RE | Admit: 2016-04-08 | Discharge: 2016-04-08 | Disposition: A | Discharge status: Home / Self Care | Payer: Medicare Other | Source: Ambulatory Visit | Attending: Family Medicine | Admitting: Family Medicine

## 2016-04-08 DIAGNOSIS — Z1231 Encounter for screening mammogram for malignant neoplasm of breast: Secondary | ICD-10-CM | POA: Diagnosis not present

## 2016-07-23 DIAGNOSIS — R350 Frequency of micturition: Secondary | ICD-10-CM | POA: Diagnosis not present

## 2016-08-13 DIAGNOSIS — D485 Neoplasm of uncertain behavior of skin: Secondary | ICD-10-CM | POA: Diagnosis not present

## 2016-08-13 DIAGNOSIS — C44612 Basal cell carcinoma of skin of right upper limb, including shoulder: Secondary | ICD-10-CM | POA: Diagnosis not present

## 2016-08-13 DIAGNOSIS — D225 Melanocytic nevi of trunk: Secondary | ICD-10-CM | POA: Diagnosis not present

## 2016-08-13 DIAGNOSIS — Z85828 Personal history of other malignant neoplasm of skin: Secondary | ICD-10-CM | POA: Diagnosis not present

## 2016-08-13 DIAGNOSIS — L821 Other seborrheic keratosis: Secondary | ICD-10-CM | POA: Diagnosis not present

## 2016-08-26 DIAGNOSIS — C44612 Basal cell carcinoma of skin of right upper limb, including shoulder: Secondary | ICD-10-CM | POA: Diagnosis not present

## 2016-10-02 DIAGNOSIS — N390 Urinary tract infection, site not specified: Secondary | ICD-10-CM | POA: Diagnosis not present

## 2016-10-02 DIAGNOSIS — R300 Dysuria: Secondary | ICD-10-CM | POA: Diagnosis not present

## 2016-10-22 DIAGNOSIS — L249 Irritant contact dermatitis, unspecified cause: Secondary | ICD-10-CM | POA: Diagnosis not present

## 2016-10-22 DIAGNOSIS — Z85828 Personal history of other malignant neoplasm of skin: Secondary | ICD-10-CM | POA: Diagnosis not present

## 2016-10-22 DIAGNOSIS — L570 Actinic keratosis: Secondary | ICD-10-CM | POA: Diagnosis not present

## 2016-10-27 DIAGNOSIS — N390 Urinary tract infection, site not specified: Secondary | ICD-10-CM | POA: Diagnosis not present

## 2016-12-02 DIAGNOSIS — R399 Unspecified symptoms and signs involving the genitourinary system: Secondary | ICD-10-CM | POA: Diagnosis not present

## 2016-12-02 DIAGNOSIS — N390 Urinary tract infection, site not specified: Secondary | ICD-10-CM | POA: Diagnosis not present

## 2016-12-23 DIAGNOSIS — I10 Essential (primary) hypertension: Secondary | ICD-10-CM | POA: Diagnosis not present

## 2016-12-23 DIAGNOSIS — Z0001 Encounter for general adult medical examination with abnormal findings: Secondary | ICD-10-CM | POA: Diagnosis not present

## 2016-12-23 DIAGNOSIS — M8589 Other specified disorders of bone density and structure, multiple sites: Secondary | ICD-10-CM | POA: Diagnosis not present

## 2016-12-23 DIAGNOSIS — Z79899 Other long term (current) drug therapy: Secondary | ICD-10-CM | POA: Diagnosis not present

## 2016-12-23 DIAGNOSIS — E7800 Pure hypercholesterolemia, unspecified: Secondary | ICD-10-CM | POA: Diagnosis not present

## 2016-12-23 DIAGNOSIS — F419 Anxiety disorder, unspecified: Secondary | ICD-10-CM | POA: Diagnosis not present

## 2016-12-23 DIAGNOSIS — Z23 Encounter for immunization: Secondary | ICD-10-CM | POA: Diagnosis not present

## 2016-12-23 DIAGNOSIS — W57XXXA Bitten or stung by nonvenomous insect and other nonvenomous arthropods, initial encounter: Secondary | ICD-10-CM | POA: Diagnosis not present

## 2017-01-25 DIAGNOSIS — R1013 Epigastric pain: Secondary | ICD-10-CM | POA: Diagnosis not present

## 2017-01-25 DIAGNOSIS — K219 Gastro-esophageal reflux disease without esophagitis: Secondary | ICD-10-CM | POA: Diagnosis not present

## 2017-01-26 ENCOUNTER — Emergency Department (HOSPITAL_COMMUNITY)
Admission: EM | Admit: 2017-01-26 | Discharge: 2017-01-26 | Disposition: A | Discharge status: Home / Self Care | Payer: Medicare Other | Attending: Emergency Medicine | Admitting: Emergency Medicine

## 2017-01-26 ENCOUNTER — Encounter (HOSPITAL_COMMUNITY): Payer: Self-pay | Admitting: *Deleted

## 2017-01-26 DIAGNOSIS — E871 Hypo-osmolality and hyponatremia: Secondary | ICD-10-CM

## 2017-01-26 DIAGNOSIS — N3000 Acute cystitis without hematuria: Secondary | ICD-10-CM | POA: Diagnosis not present

## 2017-01-26 DIAGNOSIS — R112 Nausea with vomiting, unspecified: Secondary | ICD-10-CM | POA: Diagnosis not present

## 2017-01-26 DIAGNOSIS — I10 Essential (primary) hypertension: Secondary | ICD-10-CM | POA: Diagnosis not present

## 2017-01-26 DIAGNOSIS — K30 Functional dyspepsia: Secondary | ICD-10-CM | POA: Diagnosis not present

## 2017-01-26 DIAGNOSIS — Z96651 Presence of right artificial knee joint: Secondary | ICD-10-CM | POA: Insufficient documentation

## 2017-01-26 LAB — URINALYSIS, ROUTINE W REFLEX MICROSCOPIC
Bilirubin Urine: NEGATIVE
Glucose, UA: NEGATIVE mg/dL
Ketones, ur: NEGATIVE mg/dL
Nitrite: NEGATIVE
Protein, ur: NEGATIVE mg/dL
Specific Gravity, Urine: 1.005 (ref 1.005–1.030)
pH: 7 (ref 5.0–8.0)

## 2017-01-26 LAB — COMPREHENSIVE METABOLIC PANEL
ALK PHOS: 139 U/L — AB (ref 38–126)
ALT: 34 U/L (ref 14–54)
ANION GAP: 12 (ref 5–15)
AST: 25 U/L (ref 15–41)
Albumin: 4.4 g/dL (ref 3.5–5.0)
BILIRUBIN TOTAL: 0.7 mg/dL (ref 0.3–1.2)
BUN: <5 mg/dL — ABNORMAL LOW (ref 6–20)
CALCIUM: 9.5 mg/dL (ref 8.9–10.3)
CO2: 25 mmol/L (ref 22–32)
CREATININE: 0.6 mg/dL (ref 0.44–1.00)
Chloride: 88 mmol/L — ABNORMAL LOW (ref 101–111)
GFR calc non Af Amer: >60 mL/min (ref >60)
Glucose, Bld: 139 mg/dL — ABNORMAL HIGH (ref 65–99)
Potassium: 3.8 mmol/L (ref 3.5–5.1)
SODIUM: 125 mmol/L — AB (ref 135–145)
TOTAL PROTEIN: 7.7 g/dL (ref 6.5–8.1)

## 2017-01-26 LAB — CBC
HCT: 40.5 % (ref 36.0–46.0)
Hemoglobin: 14 g/dL (ref 12.0–15.0)
MCH: 29.5 pg (ref 26.0–34.0)
MCHC: 34.6 g/dL (ref 30.0–36.0)
MCV: 85.3 fL (ref 78.0–100.0)
Platelets: 337 10*3/uL (ref 150–400)
RBC: 4.75 MIL/uL (ref 3.87–5.11)
RDW: 11.9 % (ref 11.5–15.5)
WBC: 8.6 10*3/uL (ref 4.0–10.5)

## 2017-01-26 LAB — LIPASE, BLOOD: Lipase: 29 U/L (ref 11–51)

## 2017-01-26 MED ORDER — ONDANSETRON 4 MG PO TBDP
ORAL_TABLET | ORAL | Status: AC
  Filled 2017-01-26: qty 1

## 2017-01-26 MED ORDER — DEXTROSE 5 % IV SOLN
1.0000 g | Freq: Once | INTRAVENOUS | Status: AC
  Administered 2017-01-26: 1 g via INTRAVENOUS
  Filled 2017-01-26: qty 10

## 2017-01-26 MED ORDER — GI COCKTAIL ~~LOC~~
30.0000 mL | Freq: Once | ORAL | Status: AC
Start: 2017-01-26 — End: 2017-01-26
  Administered 2017-01-26: 30 mL via ORAL
  Filled 2017-01-26: qty 30

## 2017-01-26 MED ORDER — ONDANSETRON HCL 4 MG PO TABS
4.0000 mg | ORAL_TABLET | Freq: Three times a day (TID) | ORAL | 0 refills | Status: DC | PRN
Start: 2017-01-26 — End: 2017-12-14

## 2017-01-26 MED ORDER — ONDANSETRON 4 MG PO TBDP
4.0000 mg | ORAL_TABLET | Freq: Once | ORAL | Status: AC | PRN
  Administered 2017-01-26: 4 mg via ORAL

## 2017-01-26 MED ORDER — FAMOTIDINE IN NACL 20-0.9 MG/50ML-% IV SOLN
20.0000 mg | Freq: Once | INTRAVENOUS | Status: AC
  Administered 2017-01-26: 20 mg via INTRAVENOUS
  Filled 2017-01-26: qty 50

## 2017-01-26 MED ORDER — FAMOTIDINE 20 MG PO TABS: 20.0000 mg | ORAL_TABLET | Freq: Two times a day (BID) | ORAL | 0 refills | Status: DC

## 2017-01-26 MED ORDER — CEPHALEXIN 500 MG PO CAPS: ORAL_CAPSULE | ORAL | 0 refills | Status: DC

## 2017-01-26 MED ORDER — SODIUM CHLORIDE 0.9 % IV BOLUS (SEPSIS)
1000.0000 mL | Freq: Once | INTRAVENOUS | Status: AC
  Administered 2017-01-26: 1000 mL via INTRAVENOUS

## 2017-01-26 NOTE — ED Triage Notes (Signed)
C/o history of reflux, states she had a bad episode with it 1 week ago didn't really feel good all week, Sunday has another episode and was seen by her PCP yest and given carafate, states she hasn't been able to keep it down. C/o burning in her esophagus.

## 2017-01-26 NOTE — ED Provider Notes (Signed)
Emergency Department Provider Note   I have reviewed the triage vital signs and the nursing notes.   HISTORY  Chief Complaint Nausea and Emesis   HPI Erika [name] is a 71 y.o. female history of hypertension, arthritis, hiatal hernia and indigestion presents to the emergency department with severe indigestion it's been off and on for the last week but has been pretty persistent overnight causing her difficulty sleeping. It is a sharp burning pain has been associated with multiple episodes of vomiting as well.She states is exactly same as her indigestion in the past. No chest pain or shortness of breath, lightheadedness. Doesn't seem to radiate anywhere. No intermittently get better with at home medications but not consistently. 5 episodes of nonbloody nonbilious vomiting since last night. When she vomits it does seem to make the symptoms better. No fevers or coughing.   Past Medical History:  Diagnosis Date  . Arthritis   . Hypertension     Patient Active Problem List   Diagnosis Date Noted  . Chronic cough 11/03/2012    Past Surgical History:  Procedure Laterality Date  . HERNIA REPAIR     x2  groin  . REPLACEMENT TOTAL KNEE     right knee  . TUBAL LIGATION      Current Outpatient Rx  . Order #: [id number] Class: Historical Med  . Order #: [id number] Class: Historical Med  . Order #: [id number] Class: Historical Med  . Order #: [id number] Class: Historical Med  . Order #: [id number] Class: Historical Med  . Order #: [id number] Class: Normal  . Order #: [id number] Class: Historical Med  . Order #: [id number] Class: Historical Med  . Order #: [id number] Class: Phone In  . Order #: [id number] Class: Historical Med  . Order #: [id number] Class: Print  . Order #: [id number] Class: Print  . Order #: [id number] Class: Print    Allergies Codeine and Hydrochlorothiazide  Family History  Problem Relation Age of Onset  . Emphysema Sister   . Emphysema Brother   . Hypertension Mother   .  Hypertension Father     Social History Social History  Substance Use Topics  . Smoking status: Never Smoker  . Smokeless tobacco: Never Used  . Alcohol use No    Review of Systems  All other systems negative except as documented in the HPI. All pertinent positives and negatives as reviewed in the HPI. ____________________________________________   PHYSICAL EXAM:  VITAL SIGNS: ED Triage Vitals  Enc Vitals Group     BP 01/26/17 0530 (!) 154/94     Pulse Rate 01/26/17 0530 97     Resp 01/26/17 0530 16     Temp 01/26/17 0530 97.8 F (36.6 C)     Temp Source 01/26/17 0530 Oral     SpO2 01/26/17 0530 100 %     Weight --      Height --      Head Circumference --      Peak Flow --      Pain Score 01/26/17 0533 9     Pain Loc --      Pain Edu? --      Excl. in [place]? --     Constitutional: Alert and oriented. Well appearing and in no acute distress. Eyes: Conjunctivae are normal. PERRL. EOMI. Head: Atraumatic. Nose: No congestion/rhinnorhea. Mouth/Throat: Mucous membranes are moist.  Oropharynx non-erythematous. Neck: No stridor.  No meningeal signs.   Cardiovascular: Normal rate, regular rhythm. Good peripheral circulation. Grossly normal heart sounds.   Respiratory: Normal respiratory effort.  No retractions.  Lungs CTAB. Gastrointestinal: Soft and nontender. No distention.  Musculoskeletal: No lower extremity tenderness nor edema. No gross deformities of extremities. Neurologic:  Normal speech and language. No gross focal neurologic deficits are appreciated.  Skin:  Skin is warm, dry and intact. No rash noted.   ____________________________________________   LABS (all labs ordered are listed, but only abnormal results are displayed)  Labs Reviewed  COMPREHENSIVE METABOLIC PANEL - Abnormal; Notable for the following:       Result Value   Sodium 125 (*)    Chloride 88 (*)    Glucose, Bld 139 (*)    BUN <5 (*)    Alkaline Phosphatase 139 (*)    All other  components within normal limits  URINALYSIS, ROUTINE W REFLEX MICROSCOPIC - Abnormal; Notable for the following:    APPearance HAZY (*)    Hgb urine dipstick MODERATE (*)    Leukocytes, UA LARGE (*)    Bacteria, UA RARE (*)    Squamous Epithelial / LPF 0-5 (*)    All other components within normal limits  URINE CULTURE  LIPASE, BLOOD  CBC   ____________________________________________    RADIOLOGY  No results found.  ____________________________________________   PROCEDURES  Procedure(s) performed:   Procedures   ____________________________________________   INITIAL IMPRESSION / ASSESSMENT AND PLAN / ED COURSE  Pertinent labs & imaging results that were available during my care of the patient were reviewed by me and considered in my medical decision making (see chart for details).  Suspect her symptoms are likely related to gastritis versus peptic ulcer disease. Significant improvement after GI cocktail and Pepcid. Slightly low sodium so given a liter of fluid suspect this will improve now that her ability to take fluids and food by mouth is improved. She will eat after salt for the next little bit and follow-up with her primary doctor. Also asked her to follow-up with gastroenterology secondary to her persistent indigestion type symptoms. She also follow with urology secondary to the multiple UTIs she's had.   ____________________________________________  FINAL CLINICAL IMPRESSION(S) / ED DIAGNOSES  Final diagnoses:  Nausea and vomiting, intractability of vomiting not specified, unspecified vomiting type  Indigestion  Acute cystitis without hematuria     MEDICATIONS GIVEN DURING THIS VISIT:  Medications  ondansetron (ZOFRAN-ODT) 4 MG disintegrating tablet (not administered)  ondansetron (ZOFRAN-ODT) 4 MG disintegrating tablet (not administered)  ondansetron (ZOFRAN-ODT) disintegrating tablet 4 mg (4 mg Oral Given 01/26/17 0541)  gi cocktail  (Maalox,Lidocaine,Donnatal) (30 mLs Oral Given 01/26/17 0818)  famotidine (PEPCID) IVPB 20 mg premix (0 mg Intravenous Stopped 01/26/17 0927)  cefTRIAXone (ROCEPHIN) 1 g in dextrose 5 % 50 mL IVPB (0 g Intravenous Stopped 01/26/17 0927)  sodium chloride 0.9 % bolus 1,000 mL (0 mLs Intravenous Stopped 01/26/17 1052)     NEW OUTPATIENT MEDICATIONS STARTED DURING THIS VISIT:  New Prescriptions   CEPHALEXIN (KEFLEX) 500 MG CAPSULE    2 caps po bid x 7 days   FAMOTIDINE (PEPCID) 20 MG TABLET    Take 1 tablet (20 mg total) by mouth 2 (two) times daily.   ONDANSETRON (ZOFRAN) 4 MG TABLET    Take 1 tablet (4 mg total) by mouth every 8 (eight) hours as needed for nausea or vomiting.    Note:  This document was prepared using Dragon voice recognition software and may include unintentional dictation errors.   Merrily Pew, MD 01/26/17 1204

## 2017-01-28 LAB — URINE CULTURE

## 2017-01-29 ENCOUNTER — Encounter: Payer: Self-pay | Admitting: Gastroenterology

## 2017-01-29 ENCOUNTER — Telehealth: Payer: Self-pay | Admitting: *Deleted

## 2017-01-29 NOTE — Telephone Encounter (Signed)
Post ED Visit - Positive Culture Follow-up  Culture report reviewed by antimicrobial stewardship pharmacist:  []  Elenor Quinones, Pharm.D. [x]  Heide Guile, Pharm.D., BCPS AQ-ID []  Parks Neptune, Pharm.D., BCPS []  Alycia Rossetti, Pharm.D., BCPS []  [place], Pharm.D., BCPS, AAHIVP []  Legrand Como, Pharm.D., BCPS, AAHIVP []  Salome Arnt, PharmD, BCPS []  Dimitri Ped, PharmD, BCPS []  Vincenza Hews, PharmD, BCPS  Positive urine culture Treated with Cephalexin, organism sensitive to the same and no further patient follow-up is required at this time.  Harlon Flor [facility] 01/29/2017, 10:24 AM

## 2017-02-03 DIAGNOSIS — K219 Gastro-esophageal reflux disease without esophagitis: Secondary | ICD-10-CM | POA: Diagnosis not present

## 2017-02-03 DIAGNOSIS — E871 Hypo-osmolality and hyponatremia: Secondary | ICD-10-CM | POA: Diagnosis not present

## 2017-02-03 DIAGNOSIS — N390 Urinary tract infection, site not specified: Secondary | ICD-10-CM | POA: Diagnosis not present

## 2017-02-18 DIAGNOSIS — K219 Gastro-esophageal reflux disease without esophagitis: Secondary | ICD-10-CM | POA: Diagnosis not present

## 2017-02-18 DIAGNOSIS — Z1211 Encounter for screening for malignant neoplasm of colon: Secondary | ICD-10-CM | POA: Diagnosis not present

## 2017-02-19 DIAGNOSIS — N390 Urinary tract infection, site not specified: Secondary | ICD-10-CM | POA: Diagnosis not present

## 2017-02-19 DIAGNOSIS — E871 Hypo-osmolality and hyponatremia: Secondary | ICD-10-CM | POA: Diagnosis not present

## 2017-02-19 DIAGNOSIS — R8290 Unspecified abnormal findings in urine: Secondary | ICD-10-CM | POA: Diagnosis not present

## 2017-02-26 ENCOUNTER — Other Ambulatory Visit: Payer: Self-pay | Admitting: Family Medicine

## 2017-02-26 DIAGNOSIS — Z1231 Encounter for screening mammogram for malignant neoplasm of breast: Secondary | ICD-10-CM

## 2017-03-09 DIAGNOSIS — N3020 Other chronic cystitis without hematuria: Secondary | ICD-10-CM | POA: Diagnosis not present

## 2017-03-09 DIAGNOSIS — N3010 Interstitial cystitis (chronic) without hematuria: Secondary | ICD-10-CM | POA: Diagnosis not present

## 2017-03-29 ENCOUNTER — Ambulatory Visit: Payer: Medicare Other | Admitting: Gastroenterology

## 2017-04-09 ENCOUNTER — Ambulatory Visit: Payer: Medicare Other

## 2017-04-12 DIAGNOSIS — Z1211 Encounter for screening for malignant neoplasm of colon: Secondary | ICD-10-CM | POA: Diagnosis not present

## 2017-04-12 DIAGNOSIS — K449 Diaphragmatic hernia without obstruction or gangrene: Secondary | ICD-10-CM | POA: Diagnosis not present

## 2017-04-12 DIAGNOSIS — K5730 Diverticulosis of large intestine without perforation or abscess without bleeding: Secondary | ICD-10-CM | POA: Diagnosis not present

## 2017-04-12 DIAGNOSIS — K219 Gastro-esophageal reflux disease without esophagitis: Secondary | ICD-10-CM | POA: Diagnosis not present

## 2017-04-12 DIAGNOSIS — K640 First degree hemorrhoids: Secondary | ICD-10-CM | POA: Diagnosis not present

## 2017-05-14 DIAGNOSIS — S81811A Laceration without foreign body, right lower leg, initial encounter: Secondary | ICD-10-CM | POA: Diagnosis not present

## 2017-05-18 ENCOUNTER — Ambulatory Visit
Admission: RE | Admit: 2017-05-18 | Discharge: 2017-05-18 | Disposition: A | Discharge status: Home / Self Care | Payer: Medicare Other | Source: Ambulatory Visit | Attending: Family Medicine | Admitting: Family Medicine

## 2017-05-18 DIAGNOSIS — L089 Local infection of the skin and subcutaneous tissue, unspecified: Secondary | ICD-10-CM | POA: Diagnosis not present

## 2017-05-18 DIAGNOSIS — Z1231 Encounter for screening mammogram for malignant neoplasm of breast: Secondary | ICD-10-CM | POA: Diagnosis not present

## 2017-05-18 DIAGNOSIS — S81801A Unspecified open wound, right lower leg, initial encounter: Secondary | ICD-10-CM | POA: Diagnosis not present

## 2017-05-28 HISTORY — PX: SKIN CANCER EXCISION: SHX779

## 2017-06-01 DIAGNOSIS — L089 Local infection of the skin and subcutaneous tissue, unspecified: Secondary | ICD-10-CM | POA: Diagnosis not present

## 2017-06-01 DIAGNOSIS — S81801D Unspecified open wound, right lower leg, subsequent encounter: Secondary | ICD-10-CM | POA: Diagnosis not present

## 2017-06-29 DIAGNOSIS — Z96651 Presence of right artificial knee joint: Secondary | ICD-10-CM | POA: Diagnosis not present

## 2017-07-01 DIAGNOSIS — C44722 Squamous cell carcinoma of skin of right lower limb, including hip: Secondary | ICD-10-CM | POA: Diagnosis not present

## 2017-07-01 DIAGNOSIS — D485 Neoplasm of uncertain behavior of skin: Secondary | ICD-10-CM | POA: Diagnosis not present

## 2017-07-29 DIAGNOSIS — C44722 Squamous cell carcinoma of skin of right lower limb, including hip: Secondary | ICD-10-CM | POA: Diagnosis not present

## 2017-08-31 DIAGNOSIS — N3020 Other chronic cystitis without hematuria: Secondary | ICD-10-CM | POA: Diagnosis not present

## 2017-09-28 DIAGNOSIS — Z48817 Encounter for surgical aftercare following surgery on the skin and subcutaneous tissue: Secondary | ICD-10-CM | POA: Diagnosis not present

## 2017-09-28 DIAGNOSIS — Z85828 Personal history of other malignant neoplasm of skin: Secondary | ICD-10-CM | POA: Diagnosis not present

## 2017-10-01 DIAGNOSIS — H25092 Other age-related incipient cataract, left eye: Secondary | ICD-10-CM | POA: Diagnosis not present

## 2017-10-01 DIAGNOSIS — H524 Presbyopia: Secondary | ICD-10-CM | POA: Diagnosis not present

## 2017-10-01 DIAGNOSIS — H5203 Hypermetropia, bilateral: Secondary | ICD-10-CM | POA: Diagnosis not present

## 2017-10-01 DIAGNOSIS — H52223 Regular astigmatism, bilateral: Secondary | ICD-10-CM | POA: Diagnosis not present

## 2017-10-01 DIAGNOSIS — H25811 Combined forms of age-related cataract, right eye: Secondary | ICD-10-CM | POA: Diagnosis not present

## 2017-10-11 DIAGNOSIS — R399 Unspecified symptoms and signs involving the genitourinary system: Secondary | ICD-10-CM | POA: Diagnosis not present

## 2017-10-12 ENCOUNTER — Inpatient Hospital Stay (HOSPITAL_COMMUNITY): Payer: Medicare Other

## 2017-10-12 ENCOUNTER — Inpatient Hospital Stay (HOSPITAL_COMMUNITY)
Admission: EM | Admit: 2017-10-12 | Discharge: 2017-10-15 | DRG: 872 | Disposition: A | Discharge status: Home / Self Care | Payer: Medicare Other | Attending: Family Medicine | Admitting: Family Medicine

## 2017-10-12 ENCOUNTER — Encounter (HOSPITAL_COMMUNITY): Payer: Self-pay | Admitting: Family Medicine

## 2017-10-12 ENCOUNTER — Other Ambulatory Visit: Payer: Self-pay

## 2017-10-12 ENCOUNTER — Emergency Department (HOSPITAL_COMMUNITY): Payer: Medicare Other

## 2017-10-12 DIAGNOSIS — E042 Nontoxic multinodular goiter: Secondary | ICD-10-CM | POA: Diagnosis present

## 2017-10-12 DIAGNOSIS — N858 Other specified noninflammatory disorders of uterus: Secondary | ICD-10-CM

## 2017-10-12 DIAGNOSIS — R0789 Other chest pain: Secondary | ICD-10-CM

## 2017-10-12 DIAGNOSIS — K219 Gastro-esophageal reflux disease without esophagitis: Secondary | ICD-10-CM | POA: Diagnosis not present

## 2017-10-12 DIAGNOSIS — Z66 Do not resuscitate: Secondary | ICD-10-CM | POA: Diagnosis not present

## 2017-10-12 DIAGNOSIS — W57XXXA Bitten or stung by nonvenomous insect and other nonvenomous arthropods, initial encounter: Secondary | ICD-10-CM | POA: Diagnosis present

## 2017-10-12 DIAGNOSIS — E079 Disorder of thyroid, unspecified: Secondary | ICD-10-CM

## 2017-10-12 DIAGNOSIS — A4151 Sepsis due to Escherichia coli [E. coli]: Secondary | ICD-10-CM | POA: Diagnosis present

## 2017-10-12 DIAGNOSIS — Z7951 Long term (current) use of inhaled steroids: Secondary | ICD-10-CM | POA: Diagnosis not present

## 2017-10-12 DIAGNOSIS — N390 Urinary tract infection, site not specified: Secondary | ICD-10-CM | POA: Diagnosis not present

## 2017-10-12 DIAGNOSIS — Z8249 Family history of ischemic heart disease and other diseases of the circulatory system: Secondary | ICD-10-CM

## 2017-10-12 DIAGNOSIS — R791 Abnormal coagulation profile: Secondary | ICD-10-CM | POA: Diagnosis present

## 2017-10-12 DIAGNOSIS — R079 Chest pain, unspecified: Secondary | ICD-10-CM | POA: Diagnosis not present

## 2017-10-12 DIAGNOSIS — D72819 Decreased white blood cell count, unspecified: Secondary | ICD-10-CM | POA: Diagnosis present

## 2017-10-12 DIAGNOSIS — E871 Hypo-osmolality and hyponatremia: Secondary | ICD-10-CM | POA: Diagnosis not present

## 2017-10-12 DIAGNOSIS — D259 Leiomyoma of uterus, unspecified: Secondary | ICD-10-CM | POA: Diagnosis not present

## 2017-10-12 DIAGNOSIS — R7989 Other specified abnormal findings of blood chemistry: Secondary | ICD-10-CM | POA: Diagnosis present

## 2017-10-12 DIAGNOSIS — E222 Syndrome of inappropriate secretion of antidiuretic hormone: Secondary | ICD-10-CM | POA: Diagnosis not present

## 2017-10-12 DIAGNOSIS — I10 Essential (primary) hypertension: Secondary | ICD-10-CM | POA: Diagnosis not present

## 2017-10-12 DIAGNOSIS — R748 Abnormal levels of other serum enzymes: Secondary | ICD-10-CM

## 2017-10-12 DIAGNOSIS — R531 Weakness: Secondary | ICD-10-CM | POA: Diagnosis not present

## 2017-10-12 DIAGNOSIS — Z96651 Presence of right artificial knee joint: Secondary | ICD-10-CM | POA: Diagnosis present

## 2017-10-12 DIAGNOSIS — Z87440 Personal history of urinary (tract) infections: Secondary | ICD-10-CM | POA: Diagnosis not present

## 2017-10-12 DIAGNOSIS — M1990 Unspecified osteoarthritis, unspecified site: Secondary | ICD-10-CM | POA: Diagnosis present

## 2017-10-12 DIAGNOSIS — A419 Sepsis, unspecified organism: Secondary | ICD-10-CM | POA: Diagnosis not present

## 2017-10-12 DIAGNOSIS — Z85828 Personal history of other malignant neoplasm of skin: Secondary | ICD-10-CM

## 2017-10-12 DIAGNOSIS — R7401 Elevation of levels of liver transaminase levels: Secondary | ICD-10-CM | POA: Diagnosis present

## 2017-10-12 DIAGNOSIS — R945 Abnormal results of liver function studies: Secondary | ICD-10-CM

## 2017-10-12 DIAGNOSIS — K824 Cholesterolosis of gallbladder: Secondary | ICD-10-CM | POA: Diagnosis not present

## 2017-10-12 DIAGNOSIS — I471 Supraventricular tachycardia: Secondary | ICD-10-CM | POA: Diagnosis not present

## 2017-10-12 DIAGNOSIS — I480 Paroxysmal atrial fibrillation: Secondary | ICD-10-CM | POA: Diagnosis not present

## 2017-10-12 DIAGNOSIS — D696 Thrombocytopenia, unspecified: Secondary | ICD-10-CM | POA: Diagnosis present

## 2017-10-12 DIAGNOSIS — D61818 Other pancytopenia: Secondary | ICD-10-CM | POA: Diagnosis present

## 2017-10-12 DIAGNOSIS — R740 Nonspecific elevation of levels of transaminase and lactic acid dehydrogenase [LDH]: Secondary | ICD-10-CM

## 2017-10-12 HISTORY — DX: Hypo-osmolality and hyponatremia: E87.1

## 2017-10-12 HISTORY — DX: Urinary tract infection, site not specified: N39.0

## 2017-10-12 HISTORY — DX: Gastro-esophageal reflux disease without esophagitis: K21.9

## 2017-10-12 LAB — TSH: TSH: 0.369 u[IU]/mL (ref 0.350–4.500)

## 2017-10-12 LAB — CBC WITH DIFFERENTIAL/PLATELET
Basophils Absolute: 0 10*3/uL (ref 0.0–0.1)
Basophils Relative: 1 %
Eosinophils Absolute: 0 10*3/uL (ref 0.0–0.7)
Eosinophils Relative: 1 %
HCT: 30.7 % — ABNORMAL LOW (ref 36.0–46.0)
Hemoglobin: 10.8 g/dL — ABNORMAL LOW (ref 12.0–15.0)
Lymphocytes Relative: 17 %
Lymphs Abs: 0.2 10*3/uL — ABNORMAL LOW (ref 0.7–4.0)
MCH: 29.7 pg (ref 26.0–34.0)
MCHC: 35.2 g/dL (ref 30.0–36.0)
MCV: 84.3 fL (ref 78.0–100.0)
Monocytes Absolute: 0.1 10*3/uL (ref 0.1–1.0)
Monocytes Relative: 6 %
Neutro Abs: 0.9 10*3/uL — ABNORMAL LOW (ref 1.7–7.7)
Neutrophils Relative %: 75 %
Platelets: 73 10*3/uL — ABNORMAL LOW (ref 150–400)
RBC: 3.64 MIL/uL — ABNORMAL LOW (ref 3.87–5.11)
RDW: 11.9 % (ref 11.5–15.5)
WBC Morphology: INCREASED
WBC: 1.2 10*3/uL — CL (ref 4.0–10.5)

## 2017-10-12 LAB — BASIC METABOLIC PANEL
ANION GAP: 10 (ref 5–15)
BUN: 8 mg/dL (ref 6–20)
CHLORIDE: 88 mmol/L — AB (ref 101–111)
CO2: 22 mmol/L (ref 22–32)
Calcium: 8.6 mg/dL — ABNORMAL LOW (ref 8.9–10.3)
Creatinine, Ser: 0.71 mg/dL (ref 0.44–1.00)
GFR calc Af Amer: >60 mL/min (ref >60)
GLUCOSE: 125 mg/dL — AB (ref 65–99)
POTASSIUM: 3.9 mmol/L (ref 3.5–5.1)
Sodium: 120 mmol/L — ABNORMAL LOW (ref 135–145)

## 2017-10-12 LAB — DIC (DISSEMINATED INTRAVASCULAR COAGULATION) PANEL
APTT: 41 s — AB (ref 24–36)
D DIMER QUANT: 11.11 ug{FEU}/mL — AB (ref 0.00–0.50)
FIBRINOGEN: 310 mg/dL (ref 210–475)
INR: 1.02
PROTHROMBIN TIME: 13.3 s (ref 11.4–15.2)

## 2017-10-12 LAB — COMPREHENSIVE METABOLIC PANEL
ALT: 119 U/L — AB (ref 14–54)
AST: 190 U/L — AB (ref 15–41)
Albumin: 2.6 g/dL — ABNORMAL LOW (ref 3.5–5.0)
Alkaline Phosphatase: 218 U/L — ABNORMAL HIGH (ref 38–126)
Anion gap: 8 (ref 5–15)
BILIRUBIN TOTAL: 1.2 mg/dL (ref 0.3–1.2)
BUN: 7 mg/dL (ref 6–20)
CHLORIDE: 93 mmol/L — AB (ref 101–111)
CO2: 19 mmol/L — ABNORMAL LOW (ref 22–32)
CREATININE: 0.71 mg/dL (ref 0.44–1.00)
Calcium: 7.6 mg/dL — ABNORMAL LOW (ref 8.9–10.3)
GFR calc Af Amer: >60 mL/min (ref >60)
Glucose, Bld: 111 mg/dL — ABNORMAL HIGH (ref 65–99)
Potassium: 3.5 mmol/L (ref 3.5–5.1)
Sodium: 120 mmol/L — ABNORMAL LOW (ref 135–145)
Total Protein: 4.8 g/dL — ABNORMAL LOW (ref 6.5–8.1)

## 2017-10-12 LAB — URINALYSIS, ROUTINE W REFLEX MICROSCOPIC
Bilirubin Urine: NEGATIVE
GLUCOSE, UA: NEGATIVE mg/dL
Ketones, ur: 5 mg/dL — AB
Nitrite: POSITIVE — AB
PH: 5 (ref 5.0–8.0)
Protein, ur: NEGATIVE mg/dL
Specific Gravity, Urine: 1.014 (ref 1.005–1.030)

## 2017-10-12 LAB — I-STAT TROPONIN, ED
Troponin i, poc: 0 ng/mL (ref 0.00–0.08)
Troponin i, poc: 0 ng/mL (ref 0.00–0.08)

## 2017-10-12 LAB — FOLATE: FOLATE: 33.6 ng/mL (ref >5.9)

## 2017-10-12 LAB — IRON AND TIBC
Iron: 17 ug/dL — ABNORMAL LOW (ref 28–170)
SATURATION RATIOS: 7 % — AB (ref 10.4–31.8)
TIBC: 256 ug/dL (ref 250–450)
UIBC: 239 ug/dL

## 2017-10-12 LAB — LACTIC ACID, PLASMA
LACTIC ACID, VENOUS: 1.5 mmol/L (ref 0.5–1.9)
Lactic Acid, Venous: 2.1 mmol/L (ref 0.5–1.9)

## 2017-10-12 LAB — CBC
HEMATOCRIT: 35 % — AB (ref 36.0–46.0)
HEMOGLOBIN: 12.3 g/dL (ref 12.0–15.0)
MCH: 29.6 pg (ref 26.0–34.0)
MCHC: 35.1 g/dL (ref 30.0–36.0)
MCV: 84.1 fL (ref 78.0–100.0)
Platelets: 81 10*3/uL — ABNORMAL LOW (ref 150–400)
RBC: 4.16 MIL/uL (ref 3.87–5.11)
RDW: 12 % (ref 11.5–15.5)
WBC: 1.8 10*3/uL — ABNORMAL LOW (ref 4.0–10.5)

## 2017-10-12 LAB — BASIC METABOLIC PANEL WITH GFR
Anion gap: 9 (ref 5–15)
BUN: 8 mg/dL (ref 6–20)
CO2: 21 mmol/L — ABNORMAL LOW (ref 22–32)
Calcium: 7.9 mg/dL — ABNORMAL LOW (ref 8.9–10.3)
Chloride: 93 mmol/L — ABNORMAL LOW (ref 101–111)
Creatinine, Ser: 0.76 mg/dL (ref 0.44–1.00)
GFR calc Af Amer: >60 mL/min
GFR calc non Af Amer: >60 mL/min
Glucose, Bld: 104 mg/dL — ABNORMAL HIGH (ref 65–99)
Potassium: 3.5 mmol/L (ref 3.5–5.1)
Sodium: 123 mmol/L — ABNORMAL LOW (ref 135–145)

## 2017-10-12 LAB — INFLUENZA PANEL BY PCR (TYPE A & B)
Influenza A By PCR: NEGATIVE
Influenza B By PCR: NEGATIVE

## 2017-10-12 LAB — C-REACTIVE PROTEIN: CRP: 19.2 mg/dL — ABNORMAL HIGH (ref <1.0)

## 2017-10-12 LAB — OSMOLALITY: Osmolality: 254 mosm/kg — ABNORMAL LOW (ref 275–295)

## 2017-10-12 LAB — RETICULOCYTES
RBC.: 3.81 MIL/uL — ABNORMAL LOW (ref 3.87–5.11)
RETIC COUNT ABSOLUTE: 57.2 10*3/uL (ref 19.0–186.0)
RETIC CT PCT: 1.5 % (ref 0.4–3.1)

## 2017-10-12 LAB — HEPATIC FUNCTION PANEL
ALT: 123 U/L — AB (ref 14–54)
AST: 178 U/L — ABNORMAL HIGH (ref 15–41)
Albumin: 3.4 g/dL — ABNORMAL LOW (ref 3.5–5.0)
Alkaline Phosphatase: 211 U/L — ABNORMAL HIGH (ref 38–126)
BILIRUBIN DIRECT: 0.6 mg/dL — AB (ref 0.1–0.5)
BILIRUBIN INDIRECT: 0.6 mg/dL (ref 0.3–0.9)
TOTAL PROTEIN: 6 g/dL — AB (ref 6.5–8.1)
Total Bilirubin: 1.2 mg/dL (ref 0.3–1.2)

## 2017-10-12 LAB — DIFFERENTIAL
BASOS PCT: 0 %
Basophils Absolute: 0 10*3/uL (ref 0.0–0.1)
EOS PCT: 0 %
Eosinophils Absolute: 0 10*3/uL (ref 0.0–0.7)
LYMPHS PCT: 9 %
Lymphs Abs: 0.2 10*3/uL — ABNORMAL LOW (ref 0.7–4.0)
MONOS PCT: 3 %
Monocytes Absolute: 0.1 10*3/uL (ref 0.1–1.0)
NEUTROS ABS: 1.5 10*3/uL — AB (ref 1.7–7.7)
Neutrophils Relative %: 88 %
WBC Morphology: INCREASED

## 2017-10-12 LAB — T4, FREE: Free T4: 1.54 ng/dL (ref 0.82–1.77)

## 2017-10-12 LAB — DIC (DISSEMINATED INTRAVASCULAR COAGULATION)PANEL
Platelets: 83 10*3/uL — ABNORMAL LOW (ref 150–400)
Smear Review: NONE SEEN

## 2017-10-12 LAB — FERRITIN: FERRITIN: 1530 ng/mL — AB (ref 11–307)

## 2017-10-12 LAB — PROTIME-INR
INR: 1.22
Prothrombin Time: 15.3 seconds — ABNORMAL HIGH (ref 11.4–15.2)

## 2017-10-12 LAB — OSMOLALITY, URINE: Osmolality, Ur: 365 mOsm/kg (ref 300–900)

## 2017-10-12 LAB — PROCALCITONIN
PROCALCITONIN: 1.33 ng/mL
Procalcitonin: 0.9 ng/mL

## 2017-10-12 LAB — SEDIMENTATION RATE: SED RATE: 7 mm/h (ref 0–22)

## 2017-10-12 LAB — SODIUM, URINE, RANDOM: Sodium, Ur: 17 mmol/L

## 2017-10-12 LAB — VITAMIN B12: Vitamin B-12: 5813 pg/mL — ABNORMAL HIGH (ref 180–914)

## 2017-10-12 MED ORDER — POTASSIUM CHLORIDE IN NACL 20-0.9 MEQ/L-% IV SOLN
INTRAVENOUS | Status: DC
  Administered 2017-10-12: 10:00:00 via INTRAVENOUS
  Filled 2017-10-12: qty 1000

## 2017-10-12 MED ORDER — SODIUM CHLORIDE 0.9 % IV SOLN
INTRAVENOUS | Status: DC
Start: 2017-10-12 — End: 2017-10-15
  Administered 2017-10-12 – 2017-10-15 (×6): via INTRAVENOUS

## 2017-10-12 MED ORDER — ONDANSETRON HCL 4 MG/2ML IJ SOLN: 4.0000 mg | Freq: Four times a day (QID) | INTRAMUSCULAR | Status: AC | PRN

## 2017-10-12 MED ORDER — GI COCKTAIL ~~LOC~~: 30.0000 mL | Freq: Three times a day (TID) | ORAL | Status: DC | PRN

## 2017-10-12 MED ORDER — ACETAMINOPHEN 650 MG RE SUPP: 650.0000 mg | Freq: Four times a day (QID) | RECTAL | Status: DC | PRN

## 2017-10-12 MED ORDER — SODIUM CHLORIDE 0.9 % IV SOLN
1.0000 g | INTRAVENOUS | Status: DC
  Filled 2017-10-12: qty 10

## 2017-10-12 MED ORDER — ACETAMINOPHEN 325 MG PO TABS
650.0000 mg | ORAL_TABLET | Freq: Four times a day (QID) | ORAL | Status: DC | PRN
  Administered 2017-10-12: 650 mg via ORAL
  Filled 2017-10-12: qty 2

## 2017-10-12 MED ORDER — SODIUM CHLORIDE 0.9 % IV SOLN
100.0000 mg | Freq: Two times a day (BID) | INTRAVENOUS | Status: DC
  Administered 2017-10-12 – 2017-10-13 (×4): 100 mg via INTRAVENOUS
  Filled 2017-10-12 (×5): qty 100

## 2017-10-12 MED ORDER — PANTOPRAZOLE SODIUM 40 MG PO TBEC
40.0000 mg | DELAYED_RELEASE_TABLET | Freq: Every day | ORAL | Status: DC
  Administered 2017-10-12 – 2017-10-15 (×4): 40 mg via ORAL
  Filled 2017-10-12 (×4): qty 1

## 2017-10-12 MED ORDER — SODIUM CHLORIDE 0.9% FLUSH
3.0000 mL | Freq: Two times a day (BID) | INTRAVENOUS | Status: DC
  Administered 2017-10-12 – 2017-10-15 (×3): 3 mL via INTRAVENOUS

## 2017-10-12 MED ORDER — ONDANSETRON HCL 4 MG/2ML IJ SOLN
4.0000 mg | Freq: Once | INTRAMUSCULAR | Status: AC
  Administered 2017-10-12: 4 mg via INTRAVENOUS

## 2017-10-12 MED ORDER — ACETAMINOPHEN 325 MG PO TABS
650.0000 mg | ORAL_TABLET | Freq: Four times a day (QID) | ORAL | Status: DC | PRN
  Administered 2017-10-12 – 2017-10-13 (×6): 650 mg via ORAL
  Filled 2017-10-12 (×6): qty 2

## 2017-10-12 MED ORDER — IOPAMIDOL (ISOVUE-370) INJECTION 76%
INTRAVENOUS | Status: AC
  Filled 2017-10-12: qty 100

## 2017-10-12 MED ORDER — DIPHENHYDRAMINE HCL 50 MG/ML IJ SOLN
25.0000 mg | Freq: Once | INTRAMUSCULAR | Status: AC
  Administered 2017-10-12: 25 mg via INTRAVENOUS
  Filled 2017-10-12: qty 1

## 2017-10-12 MED ORDER — METOCLOPRAMIDE HCL 5 MG/ML IJ SOLN
10.0000 mg | Freq: Once | INTRAMUSCULAR | Status: AC
  Administered 2017-10-12: 10 mg via INTRAVENOUS
  Filled 2017-10-12: qty 2

## 2017-10-12 MED ORDER — SODIUM CHLORIDE 0.9 % IV BOLUS
500.0000 mL | Freq: Once | INTRAVENOUS | Status: AC
  Administered 2017-10-12: 500 mL via INTRAVENOUS

## 2017-10-12 MED ORDER — LORAZEPAM 0.5 MG PO TABS: 0.5000 mg | ORAL_TABLET | Freq: Every day | ORAL | Status: DC | PRN

## 2017-10-12 MED ORDER — ONDANSETRON HCL 4 MG/2ML IJ SOLN
4.0000 mg | Freq: Once | INTRAMUSCULAR | Status: DC
  Filled 2017-10-12: qty 2

## 2017-10-12 MED ORDER — FLUTICASONE PROPIONATE 50 MCG/ACT NA SUSP
1.0000 | Freq: Every day | NASAL | Status: DC
  Administered 2017-10-12 – 2017-10-15 (×4): 1 via NASAL
  Filled 2017-10-12: qty 16

## 2017-10-12 MED ORDER — SODIUM CHLORIDE 0.9 % IV BOLUS
1000.0000 mL | Freq: Once | INTRAVENOUS | Status: AC
  Administered 2017-10-12: 1000 mL via INTRAVENOUS

## 2017-10-12 MED ORDER — FAMOTIDINE IN NACL 20-0.9 MG/50ML-% IV SOLN
20.0000 mg | Freq: Two times a day (BID) | INTRAVENOUS | Status: DC
  Administered 2017-10-12 – 2017-10-14 (×6): 20 mg via INTRAVENOUS
  Filled 2017-10-12 (×6): qty 50

## 2017-10-12 MED ORDER — IOPAMIDOL (ISOVUE-370) INJECTION 76%
100.0000 mL | Freq: Once | INTRAVENOUS | Status: AC
  Administered 2017-10-12: 100 mL via INTRAVENOUS

## 2017-10-12 NOTE — Progress Notes (Signed)
Pt having intermittent bouts of SVT past 10 hours.  Pt asymptomatic.  Informed Dr. Lorin Mercy.  Irven [place], RN .

## 2017-10-12 NOTE — Progress Notes (Signed)
Called by RN to report intermittent episodes of SVT lasting up to 1 minute through the day.  Her recent labs show slight worsening of Na++ - will increase IVF to 100 cc per hour and continue to monitor on telemetry for now.  Carlyon Shadow, M.D.

## 2017-10-12 NOTE — ED Provider Notes (Signed)
[facility] EMERGENCY DEPARTMENT Provider Note   CSN: [id number] Arrival date & time: 10/12/17  0133     History   Chief Complaint Chief Complaint  Patient presents with  . Chest Pain    HPI Erika [name] is a 72 y.o. female.  The history is provided by the patient.  She has history of hypertension, arthritis, chronic cough, GERD and comes in complaining of worsening acid reflux over the last 3 days.  This has been associated with nausea.  She has not vomited.  Acid reflux is causing burning pain in the chest which is worse when she lays to flat.  She keeps the head of the bed elevated 4 inches.  She is also complaining of a left temporal headache.  She denies any vision change.  She denies any weakness or numbness or tingling.  She saw her PCP earlier today who diagnosed a UTI and gave her a prescription for Cipro.  Nausea became worse after taking Cipro.  Past Medical History:  Diagnosis Date  . Arthritis   . Hypertension     Patient Active Problem List   Diagnosis Date Noted  . Chronic cough 11/03/2012    Past Surgical History:  Procedure Laterality Date  . HERNIA REPAIR     x2  groin  . REPLACEMENT TOTAL KNEE     right knee  . TUBAL LIGATION       OB History   None      Home Medications    Prior to Admission medications   Medication Sig Start Date End Date Taking? Authorizing Provider  amLODipine (NORVASC) 5 MG tablet Take 5 mg by mouth daily.   Yes [provider]  cetirizine (ZYRTEC) 10 MG tablet Take 10 mg by mouth daily.   Yes [provider]  ciprofloxacin (CIPRO) 500 MG tablet Take 500 mg by mouth 2 (two) times daily. 10/11/17  Yes [provider]  famotidine (PEPCID) 20 MG tablet Take 1 tablet (20 mg total) by mouth 2 (two) times daily. 01/26/17  Yes Mesner, Corene Cornea, MD  fluticasone (FLONASE) 50 MCG/ACT nasal spray INSTILL 2 SPRAYS IN EACH NOSTRIL EVERY DAY. 09/05/13  Yes Clance, Armando Reichert, MD  LORazepam  (ATIVAN) 0.5 MG tablet Take 0.5 mg by mouth daily as needed for anxiety.   Yes [provider]  losartan (COZAAR) 50 MG tablet Take 50 mg by mouth daily.  11/16/12  Yes [provider]  omeprazole (PRILOSEC) 20 MG capsule Take 2 capsules (40 mg total) by mouth 2 (two) times daily. 12/06/12  Yes Clance, Armando Reichert, MD  ondansetron (ZOFRAN) 4 MG tablet Take 1 tablet (4 mg total) by mouth every 8 (eight) hours as needed for nausea or vomiting. 01/26/17  Yes Mesner, Corene Cornea, MD  simvastatin (ZOCOR) 20 MG tablet Take 20 mg by mouth daily.   Yes [provider]  cephALEXin (KEFLEX) 500 MG capsule 2 caps po bid x 7 days Patient not taking: Reported on 10/12/2017 01/26/17   Mesner, Corene Cornea, MD    Family History Family History  Problem Relation Age of Onset  . Emphysema Sister   . Emphysema Brother   . Hypertension Mother   . Hypertension Father     Social History Social History   Tobacco Use  . Smoking status: Never Smoker  . Smokeless tobacco: Never Used  Substance Use Topics  . Alcohol use: No  . Drug use: No     Allergies   Codeine and Hydrochlorothiazide  Review of Systems Review of Systems  All other systems reviewed and are negative.    Physical Exam Updated Vital Signs BP (!) 89/58 (BP Location: Right Arm)   Pulse 86   Temp 99.1 F (37.3 C) (Oral)   Resp (!) 21   Ht 5\' 5"  (1.651 m)   Wt 67.1 kg (148 lb)   SpO2 96%   BMI 24.63 kg/m   Physical Exam  Nursing note and vitals reviewed.  72 year old female, resting comfortably and in no acute distress. Vital signs are significant for hypertension and borderline elevated respiratory rate. Oxygen saturation is 96%, which is normal. Head is normocephalic and atraumatic. PERRLA, EOMI. Oropharynx is clear. Neck is nontender and supple without adenopathy or JVD. Back is nontender and there is no CVA tenderness. Lungs are clear without rales, wheezes, or rhonchi. Chest is nontender. Heart has regular  rate and rhythm without murmur. Abdomen is soft, flat, nontender without masses or hepatosplenomegaly and peristalsis is normoactive. Extremities have no cyanosis or edema, full range of motion is present. Skin is warm and dry without rash. Neurologic: Mental status is normal, cranial nerves are intact, there are no motor or sensory deficits.  ED Treatments / Results  Labs (all labs ordered are listed, but only abnormal results are displayed) Labs Reviewed  BASIC METABOLIC PANEL - Abnormal; Notable for the following components:      Result Value   Sodium 120 (*)    Chloride 88 (*)    Glucose, Bld 125 (*)    Calcium 8.6 (*)    All other components within normal limits  CBC - Abnormal; Notable for the following components:   WBC 1.8 (*)    HCT 35.0 (*)    Platelets 81 (*)    All other components within normal limits  DIFFERENTIAL  HEPATIC FUNCTION PANEL  URINALYSIS, ROUTINE W REFLEX MICROSCOPIC  I-STAT TROPONIN, ED    EKG EKG Interpretation  Date/Time:  Tuesday October 12 2017 01:46:59 EDT Ventricular Rate:  100 PR Interval:  178 QRS Duration: 86 QT Interval:  314 QTC Calculation: 405 R Axis:   59 Text Interpretation:  Sinus rhythm with Premature supraventricular complexes Low voltage QRS Cannot rule out Anterior infarct , age undetermined Abnormal ECG When compared with ECG of 09/10/2012, Premature atrial complexes are now present Confirmed by Delora Fuel ([id number]) on 10/12/2017 3:12:23 AM   Radiology Dg Chest 2 View  Result Date: 10/12/2017 CLINICAL DATA:  Chest pain off and on since yesterday. Vomiting. Diagnosed with urinary tract infection at primary care physician today. EXAM: CHEST - 2 VIEW COMPARISON:  09/10/2012 FINDINGS: Shallow inspiration. Heart size and pulmonary vascularity are normal. Central interstitial changes with peribronchial thickening suggesting acute or chronic bronchitis. Blunting of the costophrenic angles suggests small pleural effusions. No focal  consolidation or airspace disease. No pneumothorax. Mediastinal contours appear intact. Degenerative changes in the spine and shoulders. IMPRESSION: 1. Bronchitic changes, possibly acute or chronic. No focal consolidation. 2. Probable small bilateral pleural effusions. Electronically Signed   By: Lucienne Capers M.D.   On: 10/12/2017 02:11    Procedures Procedures  Medications Ordered in ED Medications  sodium chloride 0.9 % bolus 1,000 mL (0 mLs Intravenous Stopped 10/12/17 0435)  metoCLOPramide (REGLAN) injection 10 mg (10 mg Intravenous Given 10/12/17 0435)  diphenhydrAMINE (BENADRYL) injection 25 mg (25 mg Intravenous Given 10/12/17 0435)  ondansetron (ZOFRAN) injection 4 mg (4 mg Intravenous Given 10/12/17 0335)     Initial Impression / Assessment and Plan /  ED Course  I have reviewed the triage vital signs and the nursing notes.  Pertinent labs & imaging results that were available during my care of the patient were reviewed by me and considered in my medical decision making (see chart for details).  Nausea, burning chest pain consistent with GERD, and headache.  Screening labs have been obtained at triage and show hyponatremia with sodium 120, leukopenia with WBC 1.8, and thrombocytopenia with platelets 81.  On review of old records, she had an ED visit in October with sodium 125, but she had been having vomiting prior to that ED visit.  Also, leukopenia and thrombocytopenia are new.  We will get WBC differential and hepatic functions to further evaluate these.  In the meantime, she is given IV fluids, metoclopramide, diphenhydramine.  Overall picture is concerning for possible hematologic malignancy.  She is feeling better after IV fluids.  WBC differential shows 88% neutrophils but with increased bands.  This does not put her at risk for leukopenic infection.  Hepatic function panel significant for moderate elevation of transaminases and alkaline phosphatase.  Case is discussed with Dr.  Myna Hidalgo of [facility], who agrees to admit the patient.  Final Clinical Impressions(s) / ED Diagnoses   Final diagnoses:  Hyponatremia  Thrombocytopenia (HCC)  Leukopenia, unspecified type  Elevated liver enzymes  Atypical chest pain    ED Discharge Orders    None       Delora Fuel, MD 35/57/32 [phone]

## 2017-10-12 NOTE — ED Triage Notes (Signed)
Pt reports that since yesterday she has been having CP on and off, with vomiting, pt radiates to back, saw PCP today and diagnosed with UTI

## 2017-10-12 NOTE — Progress Notes (Signed)
CRITICAL VALUE ALERT  Critical Value:  WBC 1.2  Date & Time Notied:  10/12/16 0900  Provider Notified: Erin Hearing  Orders Received/Actions taken: No new orders

## 2017-10-12 NOTE — H&P (Addendum)
History and Physical    Erika [name] MWU:[id number] DOB: June 27, 1945 DOA: 10/12/2017  **Will admit patient based on the expectation that the patient will need hospitalization/ hospital care that crosses at least 2 midnights  PCP: [name], MD   Attending physician: Lorin Mercy  Patient coming from/Resides with: Private residence  Chief Complaint: Chest discomfort, vomiting and generalized malaise  HPI: Erika [name] is a 72 y.o. female with medical history significant for severe GERD, recurrent UTI, hypertension, osteoarthritis and recurrent hyponatremia.  Reports that last Thursday into Friday she developed generalized malaise and by Friday night she was having chills.  She noticed increasing chest discomfort with eating and attributed this to her GERD.  She also developed nausea worse than baseline.  She was not having any fevers at home.  During this timeframe she had poor oral intake.  Last Wednesday she did eat out at a local restaurant and on Monday someone brought her to take out rotisserie chicken.  She also developed urinary discoloration, dysuria and frequency and was diagnosed with a UTI by her PCP and started on Cipro on 6/17.  In the a.m. she was able to tolerate 1 tablet by p.m. she developed vomiting and was unable to tolerate.  Because of her symptoms she presented to the ER.  Vital signs revealed low-grade tachycardia, low-grade fever 99.1, she was normotensive initially but repeat blood pressure briefly dropped to 86 so she was given 2 L of fluid.  Had been obtained and sodium was low at 120 with a baseline in October 2018 between 134 and 136.  Alkaline phosphatase was 211, AST 178, ALT 123, total bilirubin was normal.  Troponin and EKG were normal.  White count was low at 1800 but neutrophils were appropriate at 88%, absolute neutrophils were slightly decreased at 1.5% and lymphocytes were low at 0.2%.  Lites were also low at 81,000 and hemoglobin was normal at 12.3.  Because of  these findings patient was initially accepted for admission regarding her acute hyponatremia.  Since patient was initially accepted she has subsequently developed fever up to 103 F.  Urinalysis was pending.  Admitting physician SIADH labs.  Serum osmolality returned slightly low at 254 but this was obtained after patient had been given fluid bolus.  Also CRP was ordered which later returned positive at 19.2, lactic acid ordered and was mildly elevated at 2.1.  D-dimer was elevated at 11.11, coags were normal except for slightly elevated PTT at 41.  ESR was normal at 7.  At this juncture patient appears to be symptomatic with sepsis physiology likely related to urinary tract infection.  Additional history has been obtained from the patient noting she confirms a history of recurrent UTI and is followed by urologist.  She has also been exposed to multiple tick bites over the past 2 to 3 weeks without any rashes noted post tick bite.  Also the tick bite areas themselves only involve very tiny red areas without any significant induration, pain or drainage.    ED Course:  Vital Signs: BP 131/88 (BP Location: Left Arm)   Pulse (!) 104   Temp (!) 103 F (39.4 C) (Oral)   Resp (!) 22   Ht _0  (1.651 m)   Wt 67.9 kg (149 lb 12.8 oz)   SpO2 96%   BMI 24.93 kg/m  CXR: Changes possibly acute or chronic no focal consolidation, probable small bilateral pleural effusions. Lab data: 120, chloride 88, potassium 3.9, glucose 125, BUN 8, creatinine  0.71, anion gap 10, alkaline phosphatase 211, AST 178, ALT 123, total bilirubin 0.2,, troponin normal, iron 17, sat 7, ferritin 1530, folate 33.6, CRP 19.2, lactic acid 2.1, osmolality 254, B12 5813, white count 1800 with neutrophils 88%, absolute neutrophils 1.5%, hemoglobin 12.3, platelets 81,000, absolute lymphocyte 0.2% with lymphocytes 9%, 7, d-dimer 11.11, fibrinogen 310, coags normal except for mildly elevated PTT at 41, blood cultures have been obtained after  arrival to unit.  Urinalysis and culture pending collection Medications and treatments: NS bolus x1 L, Reglan 10 mg IV x1, Benadryl 25 mg IV x1, Zofran 4 mg IV x1  Review of Systems:  In addition to the HPI above,  No Headache, changes with Vision or hearing, new weakness, tingling, numbness in any extremity, dizziness, dysarthria or word finding difficulty, gait disturbance or imbalance, tremors or seizure activity No problems swallowing food or Liquids, indigestion/reflux, choking or coughing while eating, abdominal pain with or after eating No Cough or Shortness of Breath, palpitations, orthopnea or DOE No Abdominal pain, melena,hematochezia, dark tarry stools, constipation No dysuria, malodorous urine, hematuria or flank pain No new skin rashes, lesions, masses or bruises, No new joint pains, aches, swelling or redness No recent unintentional weight gain or loss No polyuria, polydypsia or polyphagia   Past Medical History:  Diagnosis Date  . Arthritis   . Chronic hyponatremia   . GERD (gastroesophageal reflux disease)   . Hypertension   . Recurrent UTI     Past Surgical History:  Procedure Laterality Date  . HERNIA REPAIR     x2  groin  . REPLACEMENT TOTAL KNEE     right knee  . TUBAL LIGATION      Social History   Socioeconomic History  . Marital status: Divorced    Spouse name: Not on file  . Number of children: 3  . Years of education: Not on file  . Highest education level: Not on file  Occupational History  . Occupation: Art gallery manager Rep    Employer: [employer]  Social Needs  . Financial resource strain: Not on file  . Food insecurity:    Worry: Not on file    Inability: Not on file  . Transportation needs:    Medical: Not on file    Non-medical: Not on file  Tobacco Use  . Smoking status: Never Smoker  . Smokeless tobacco: Never Used  Substance and Sexual Activity  . Alcohol use: No  . Drug use: No  . Sexual activity: Not on  file  Lifestyle  . Physical activity:    Days per week: Not on file    Minutes per session: Not on file  . Stress: Not on file  Relationships  . Social connections:    Talks on phone: Not on file    Gets together: Not on file    Attends religious service: Not on file    Active member of club or organization: Not on file    Attends meetings of clubs or organizations: Not on file    Relationship status: Not on file  . Intimate partner violence:    Fear of current or ex partner: Not on file    Emotionally abused: Not on file    Physically abused: Not on file    Forced sexual activity: Not on file  Other Topics Concern  . Not on file  Social History Narrative  . Not on file    Mobility: Independent Work history: Not obtained   Allergies  Allergen Reactions  . Codeine Other (See Comments)    Unknown   . Hydrochlorothiazide Other (See Comments)    Unknown     Family History  Problem Relation Age of Onset  . Emphysema Sister   . Emphysema Brother   . Hypertension Mother   . Hypertension Father      Prior to Admission medications   Medication Sig Start Date End Date Taking? Authorizing Provider  amLODipine (NORVASC) 5 MG tablet Take 5 mg by mouth daily.   Yes [provider]  cetirizine (ZYRTEC) 10 MG tablet Take 10 mg by mouth daily.   Yes [provider]  ciprofloxacin (CIPRO) 500 MG tablet Take 500 mg by mouth 2 (two) times daily. 10/11/17  Yes [provider]  famotidine (PEPCID) 20 MG tablet Take 1 tablet (20 mg total) by mouth 2 (two) times daily. 01/26/17  Yes Mesner, Corene Cornea, MD  fluticasone (FLONASE) 50 MCG/ACT nasal spray INSTILL 2 SPRAYS IN EACH NOSTRIL EVERY DAY. 09/05/13  Yes Clance, Armando Reichert, MD  LORazepam (ATIVAN) 0.5 MG tablet Take 0.5 mg by mouth daily as needed for anxiety.   Yes [provider]  losartan (COZAAR) 50 MG tablet Take 50 mg by mouth daily.  11/16/12  Yes [provider]  omeprazole (PRILOSEC) 20 MG  capsule Take 2 capsules (40 mg total) by mouth 2 (two) times daily. 12/06/12  Yes Clance, Armando Reichert, MD  ondansetron (ZOFRAN) 4 MG tablet Take 1 tablet (4 mg total) by mouth every 8 (eight) hours as needed for nausea or vomiting. 01/26/17  Yes Mesner, Corene Cornea, MD  simvastatin (ZOCOR) 20 MG tablet Take 20 mg by mouth daily.   Yes [provider]    Physical Exam: Vitals:   10/12/17 0500 10/12/17 0530 10/12/17 0623 10/12/17 0633  BP: 115/66 (!) 104/59  131/88  Pulse: 100 100  (!) 104  Resp: (!) 25 20  (!) 22  Temp:    (!) 103 F (39.4 C)  TempSrc:    Oral  SpO2: 100% 98%  96%  Weight:   67.9 kg (149 lb 12.8 oz)   Height:   _0  (1.651 m)       Constitutional: NAD, calm, uncomfortable 2/2 mid back pain Eyes: PERRL, lids and conjunctivae normal ENMT: Mucous membranes are dry. Posterior pharynx clear of any exudate or lesions.Normal dentition.  Neck: normal, supple, no masses, no thyromegaly Respiratory: clear to auscultation bilaterally, no wheezing, no crackles. Normal respiratory effort. No accessory muscle use.  Cardiovascular: Regular rate and rhythm, no murmurs / rubs / gallops. No extremity edema. 2+ pedal pulses. No carotid bruits.  Abdomen: no tenderness, no masses palpated. No hepatosplenomegaly. Bowel sounds positive.  Musculoskeletal: no clubbing / cyanosis. No joint deformity upper and lower extremities. Good ROM, no contractures. Normal muscle tone.  Skin: no rashes, lesions, No induration Neurologic: CN 2-12 grossly intact. Sensation intact, DTR normal. Strength 5/5 x all 4 extremities.  Psychiatric: Normal judgment and insight. Alert and oriented x 3. Normal mood.    Labs on Admission: I have personally reviewed following labs and imaging studies  CBC: Recent Labs  Lab 10/12/17 0154 10/12/17 0603  WBC 1.8*  --   NEUTROABS 1.5*  --   HGB 12.3  --   HCT 35.0*  --   MCV 84.1  --   PLT 81* 83*   Basic Metabolic Panel: Recent Labs  Lab 10/12/17 0154  NA  120*  K 3.9  CL 88*  CO2 22  GLUCOSE 125*  BUN 8  CREATININE 0.71  CALCIUM 8.6*   GFR: Estimated Creatinine Clearance: 55.5 mL/min (by C-G formula based on SCr of 0.71 mg/dL). Liver Function Tests: Recent Labs  Lab 10/12/17 0154  AST 178*  ALT 123*  ALKPHOS 211*  BILITOT 1.2  PROT 6.0*  ALBUMIN 3.4*   No results for input(s): LIPASE, AMYLASE in the last 168 hours. No results for input(s): AMMONIA in the last 168 hours. Coagulation Profile: Recent Labs  Lab 10/12/17 0603  INR 1.02   Cardiac Enzymes: No results for input(s): CKTOTAL, CKMB, CKMBINDEX, TROPONINI in the last 168 hours. BNP (last 3 results) No results for input(s): PROBNP in the last 8760 hours. HbA1C: No results for input(s): HGBA1C in the last 72 hours. CBG: No results for input(s): GLUCAP in the last 168 hours. Lipid Profile: No results for input(s): CHOL, HDL, LDLCALC, TRIG, CHOLHDL, LDLDIRECT in the last 72 hours. Thyroid Function Tests: No results for input(s): TSH, T4TOTAL, FREET4, T3FREE, THYROIDAB in the last 72 hours. Anemia Panel: Recent Labs    10/12/17 0603 10/12/17 0604  VITAMINB12 5,813*  --   FOLATE  --  33.6  FERRITIN 1,530*  --   TIBC 256  --   IRON 17*  --   RETICCTPCT 1.5  --    Urine analysis:    Component Value Date/Time   COLORURINE YELLOW 01/26/2017 0540   APPEARANCEUR HAZY (A) 01/26/2017 0540   LABSPEC 1.005 01/26/2017 0540   PHURINE 7.0 01/26/2017 0540   GLUCOSEU NEGATIVE 01/26/2017 0540   HGBUR MODERATE (A) 01/26/2017 0540   BILIRUBINUR NEGATIVE 01/26/2017 0540   KETONESUR NEGATIVE 01/26/2017 0540   PROTEINUR NEGATIVE 01/26/2017 0540   UROBILINOGEN 0.2 09/08/2008 1310   NITRITE NEGATIVE 01/26/2017 0540   LEUKOCYTESUR LARGE (A) 01/26/2017 0540   Sepsis Labs: _0 (procalcitonin:4,lacticidven:4) )No results found for this or any previous visit (from the past 240 hour(s)).   Radiological Exams on Admission: Dg Chest 2 View  Result Date:  10/12/2017 CLINICAL DATA:  Chest pain off and on since yesterday. Vomiting. Diagnosed with urinary tract infection at primary care physician today. EXAM: CHEST - 2 VIEW COMPARISON:  09/10/2012 FINDINGS: Shallow inspiration. Heart size and pulmonary vascularity are normal. Central interstitial changes with peribronchial thickening suggesting acute or chronic bronchitis. Blunting of the costophrenic angles suggests small pleural effusions. No focal consolidation or airspace disease. No pneumothorax. Mediastinal contours appear intact. Degenerative changes in the spine and shoulders. IMPRESSION: 1. Bronchitic changes, possibly acute or chronic. No focal consolidation. 2. Probable small bilateral pleural effusions. Electronically Signed   By: Lucienne Capers M.D.   On: 10/12/2017 02:11    EKG: (Independently reviewed) sinus arrhythmia with ventricular rate 100 bpm, QTC 405 ms, normal R wave rotation, low voltage QRS, nonischemic EKG  Assessment/Plan Principal Problem:   Sepsis secondary to UTI /history of Recurrent UTI -Patient presents with generalized malaise, chills, nausea and vomiting and atypical chest discomfort that seems consistent with reflux.  After arrival developed fever 103 F and was found with abnormal transaminases, elevated d-dimer, elevated CRP, low iron/elevated ferritin and tachycardia consistent with sepsis physiology likely due to UTI -In 2018 patient had E. coli UTI resistant to Cipro -Begin Rocephin 1 g IV every 24 hours after urinalysis and culture obtained ** UA is abnormal and concerning for UTI -Obtain blood cultures -Repeat serum lactate at 9 AM and again in 3 hrs -Obtain procalcitonin **elevated at 0.90 -IV fluids at 125/hr -Patient follows with urology as an outpatient due  to recurrent UTI and is on cranberry pills for prophylaxis -Currently hemodynamically stable -Given N/V and high fever will check influenza PCR **influenza is negative  Active Problems:    Leukopenia/Thrombocytopenia  -Likely related to acute sepsis physiology -Follow labs -Absolute neutrophils slightly low but neutrophils are appropriately elevated in setting of suspected infection    Tick bites -Patient reports several tick bites over the past few weeks without development of rash or significant erythematous change or swelling around prior tick bite area -Tickborne illness can cause transaminitis as well as pancytopenia so as precaution will begin IV doxycycline -RMSF and other tickborne illness titers typically do not have positive titers during initial phase of illness but for completeness of evaluation will obtain RMSF titer    Elevated transaminase level -Likely secondary to sepsis physiology -Patient reports chest discomfort especially after eating- need to rule out biliary etiology therefore will obtain CT abdomen and pelvis; with sepsis physiology and high fever need to also rule out atypical presentation of hepatic abscess -Obtain acute hepatitis panel    Acute hyponatremia -Had recurrent hyponatremia over the years typically related to GI illness and/or thiazide diuretics (currently not taking) -Na+  October 2018 was 136 with current Na+ 120 -Serum osmolality unfortunately was obtained after patient had been given a liter of IV fluids in the ER so this is an accurate -Urine sodium and urine osmolality obtained and results are pending -At this juncture patient needs to recover from acute illness and if still experiencing hyponatremia 2 weeks after discharge SIADH evaluation can be undertaken by PCP -Suspect acute hyponatremia related to dehydration -Continue to follow labs    Hypertension -Blood pressure readings have been suboptimal in the context of sepsis physiology so we will hold preadmission Norvasc and Cozaar    Positive D dimer -D-dimer greater than 11 and likely related to positive sepsis physiology but given presentation with atypical chest discomfort  will obtain CT angiogram of the chest to rule out PE **No PE on CTA -No lower extremity swelling and no recent bed rest or surgical procedures     Iron deficiency?? -Patient presents with normal hemoglobin with low iron of 17 and elevated ferritin greater than 5000 and likely acute phase reactant related to acute febrile illness, suspected gram-negative sepsis -Recommend repeating anemia panel 4 weeks after discharge    Abnormal findings on CT chest, abdomen and pelvis -Dominant mass noted right thyroid measuring 1.2 x 0.9 cm with thyroid ultrasound recommended (ordered); will also obtain TSH -Leiomyomatous uterus with mass arising either from right side of uterus or area immediate adjacent to the ovary measuring 3.2 x 2.7 x 2.7 cm with transvaginal pelvic ultrasound recommended to better characterize (ordered) -Incidental finding of cyst lateral mid left kidney     GERD (gastroesophageal reflux disease) -Continue IV Pepcid while NPO and/or while experiencing continued nausea and vomiting -Unable to utilize IV PPI in the absence of acute GI bleeding due to nationwide shortage therefore once able to tolerate POs will resume oral PPI    Arthritis -Early mobilization to prevent ambulatory pain    **Additional lab, imaging and/or diagnostic evaluation at discretion of supervising physician  DVT prophylaxis: SCDs Code Status: DO NOT RESUSCITATE Family Communication: None at bedside Disposition Plan: Home Consults called: None    Elizeo Rodriques L. ANP-BC [facility] Pager [phone]   If 7PM-7AM, please contact night-coverage [email or web address] Password [facility]  10/12/2017, 8:33 AM

## 2017-10-12 NOTE — Progress Notes (Signed)
   10/12/17 0633  Vitals  Temp (!) 103 F (39.4 C)  Temp Source Oral  BP 131/88  MAP (mmHg) 101  BP Location Left Arm  BP Method Automatic  Patient Position (if appropriate) Lying  Pulse Rate (!) 104  Pulse Rate Source Monitor  Resp (!) 22  Oxygen Therapy  SpO2 96 %  O2 Device Room Air  Admitted pt to rm 5H29 from ED, pt alert and oriented, denied any chest pain at this time, oriented to room, call bell placed within reach, placed on cardiac monitor, CCMD made aware.

## 2017-10-13 DIAGNOSIS — N390 Urinary tract infection, site not specified: Secondary | ICD-10-CM

## 2017-10-13 DIAGNOSIS — A419 Sepsis, unspecified organism: Secondary | ICD-10-CM

## 2017-10-13 LAB — CBC
HCT: 28.3 % — ABNORMAL LOW (ref 36.0–46.0)
Hemoglobin: 10 g/dL — ABNORMAL LOW (ref 12.0–15.0)
MCH: 29.7 pg (ref 26.0–34.0)
MCHC: 35.3 g/dL (ref 30.0–36.0)
MCV: 84 fL (ref 78.0–100.0)
PLATELETS: 73 10*3/uL — AB (ref 150–400)
RBC: 3.37 MIL/uL — AB (ref 3.87–5.11)
RDW: 12 % (ref 11.5–15.5)
WBC: 2 10*3/uL — ABNORMAL LOW (ref 4.0–10.5)

## 2017-10-13 LAB — MISC LABCORP TEST (SEND OUT): Labcorp test code: 164722

## 2017-10-13 LAB — COMPREHENSIVE METABOLIC PANEL
ALK PHOS: 266 U/L — AB (ref 38–126)
ALT: 125 U/L — AB (ref 14–54)
ALT: 133 U/L — ABNORMAL HIGH (ref 14–54)
AST: 208 U/L — AB (ref 15–41)
AST: 230 U/L — ABNORMAL HIGH (ref 15–41)
Albumin: 2.5 g/dL — ABNORMAL LOW (ref 3.5–5.0)
Albumin: 2.4 g/dL — ABNORMAL LOW (ref 3.5–5.0)
Alkaline Phosphatase: 242 U/L — ABNORMAL HIGH (ref 38–126)
Anion gap: 8 (ref 5–15)
Anion gap: 8 (ref 5–15)
BUN: 7 mg/dL (ref 6–20)
BUN: 8 mg/dL (ref 6–20)
CALCIUM: 7.7 mg/dL — AB (ref 8.9–10.3)
CHLORIDE: 95 mmol/L — AB (ref 101–111)
CHLORIDE: 97 mmol/L — AB (ref 101–111)
CO2: 19 mmol/L — AB (ref 22–32)
CO2: 19 mmol/L — ABNORMAL LOW (ref 22–32)
CREATININE: 0.7 mg/dL (ref 0.44–1.00)
CREATININE: 0.61 mg/dL (ref 0.44–1.00)
Calcium: 7.6 mg/dL — ABNORMAL LOW (ref 8.9–10.3)
GFR calc non Af Amer: >60 mL/min (ref >60)
Glucose, Bld: 101 mg/dL — ABNORMAL HIGH (ref 65–99)
Glucose, Bld: 94 mg/dL (ref 65–99)
Potassium: 3.6 mmol/L (ref 3.5–5.1)
Potassium: 3.6 mmol/L (ref 3.5–5.1)
SODIUM: 122 mmol/L — AB (ref 135–145)
Sodium: 124 mmol/L — ABNORMAL LOW (ref 135–145)
TOTAL PROTEIN: 4.5 g/dL — AB (ref 6.5–8.1)
Total Bilirubin: 1.2 mg/dL (ref 0.3–1.2)
Total Bilirubin: 1.1 mg/dL (ref 0.3–1.2)
Total Protein: 4.5 g/dL — ABNORMAL LOW (ref 6.5–8.1)

## 2017-10-13 LAB — HEPATITIS PANEL, ACUTE
Hep A IgM: NEGATIVE
Hep B C IgM: NEGATIVE
Hepatitis B Surface Ag: NEGATIVE

## 2017-10-13 LAB — PATHOLOGIST SMEAR REVIEW

## 2017-10-13 MED ORDER — SODIUM CHLORIDE 0.9 % IV SOLN
1.0000 g | INTRAVENOUS | Status: DC
  Administered 2017-10-13: 1 g via INTRAVENOUS
  Filled 2017-10-13 (×2): qty 10

## 2017-10-13 MED ORDER — ONDANSETRON HCL 4 MG/2ML IJ SOLN
4.0000 mg | Freq: Four times a day (QID) | INTRAMUSCULAR | Status: DC | PRN
  Administered 2017-10-13 – 2017-10-14 (×2): 4 mg via INTRAVENOUS
  Filled 2017-10-13 (×2): qty 2

## 2017-10-13 NOTE — Consult Note (Addendum)
[place] Nurse wound consult note Reason for Consult: Pt states she was followed by a dermatologist prior to admission and had a biopsy recently to RLE. Wound type: Full thickness healing excisional site to right calf. Measurement: 1X.8X.1cm Wound bed: pink and moist Drainage (amount, consistency, odor) no odor or drainage Periwound: intact skin surrounding Dressing procedure/placement/frequency: Continue present plan of care with Aquaphor, gauze, and paper tape.  Pt has supplies at the bedside and plans to followup with the dermatologist after discharge. Please re-consult if further assistance is needed.  Thank-you,  Julien Girt MSN, [place], [place], [place], [place]

## 2017-10-13 NOTE — Progress Notes (Signed)
Had to stop NS patient's IV infiltrated.

## 2017-10-13 NOTE — Progress Notes (Signed)
Visited with patient via [place] for Advanced Directive.  Patient states she already has this as confirmed by son who is at bedside.  I asked the patient to please share this information with her doctor and provide a copy also if possible.  Patient stated that she would.  Patient states that they are [facility] and welcomed prayer.  We prayed together before I left the room.  Pleasant visit.  Thankful for the medical care this patient is receiving.    10/13/17 1509  Clinical Encounter Type  Visited With Patient and family together  Visit Type Initial;Spiritual support  Spiritual Encounters  Spiritual Needs Prayer

## 2017-10-13 NOTE — Progress Notes (Signed)
Patient Demographics:    Erika [name], is a 72 y.o. female, DOB - 1946-04-26, MVE:[id number]  Admit date - 10/12/2017   Admitting Physician Karmen Bongo, MD  Outpatient Primary MD for the patient is Koirala, Dibas, MD  LOS - 1   Chief Complaint  Patient presents with  . Chest Pain        Subjective:    Erika [name] today has no fevers, no emesis,  No chest pain, son at bedside, patient is coherent, complains of fatigue and malaise,  Assessment  & Plan :    Principal Problem:   Sepsis secondary to UTI [facility]) Active Problems:   Leukopenia   Thrombocytopenia (HCC)   Elevated transaminase level   Acute hyponatremia   Hypertension   Positive D dimer   Recurrent UTI   GERD (gastroesophageal reflux disease)   Arthritis   Tick bites  72 y.o. female with a Past Medical History of recurrent UTI; HTN; GERD; and chronic hyponatremia admitted on 10/12/2017 with sodium of 120, malaise and fatigue as well as poor appetite and nausea, also reported tick bite found to be thrombocytopenic   plan 1)E coli sepsis and UTI-procalcitonin is 1.3, start iv Rocephin pending sensitivities, patient had fever and tachycardia on admission, patient also has leukopenia on admission, patient has a history of recurrent UTIs, she does follow regularly with urology, she drinks over 6 L of water daily and attempt to "flush her kidneys"  2) worsening hyponatremia--she does admit to drinking up to 6 L of water per day, she also has severe hyponatremia previously when she was taking HCTZ.  Free water restriction advised, continue normal saline at 100 mL hour, follow serial sodium levels, no significant metastatic changes at this time  3)Elevated LFTs with pancytopenia----CT abdomen and pelvis without acute findings, check for hepatitis panel, get dedicated liver ultrasound  4) 2.1 cm right-sided thyroid nodule--TSH and free T4  normal, follow-up as outpatient for possible thyroid biopsy  5) uterine fibroid-asymptomatic follow-up as outpatient  6)Tick Bites -continue IV Doxycycline for empiric coverage of tick-borne illness, while awaiting RMSF and ehrlichiosis test results.  7)Pancytopenia--- patient will need outpatient hematology follow-up including possible bone marrow biopsy if pancytopenia persists despite treatment of underlying infection  Code Status : full  Disposition Plan  : home    DVT Prophylaxis  :   SCDs   Lab Results  Component Value Date   PLT 73 (L) 10/13/2017    Inpatient Medications  Scheduled Meds: . fluticasone  1 spray Each Nare Daily  . pantoprazole  40 mg Oral Daily  . sodium chloride flush  3 mL Intravenous Q12H   Continuous Infusions: . sodium chloride 100 mL/hr at 10/13/17 1200  . cefTRIAXone (ROCEPHIN)  IV 1 g (10/13/17 1216)  . doxycycline (VIBRAMYCIN) IV Stopped (10/13/17 1130)  . famotidine (PEPCID) IV 20 mg (10/13/17 0902)   PRN Meds:.acetaminophen **OR** acetaminophen, gi cocktail, LORazepam    Anti-infectives (From admission, onward)   Start     Dose/Rate Route Frequency Ordered Stop   10/13/17 1100  cefTRIAXone (ROCEPHIN) 1 g in sodium chloride 0.9 % 100 mL IVPB     1 g 200 mL/hr over 30 Minutes Intravenous Every 24 hours 10/13/17 1050     10/12/17  0830  doxycycline (VIBRAMYCIN) 100 mg in sodium chloride 0.9 % 250 mL IVPB     100 mg 125 mL/hr over 120 Minutes Intravenous Every 12 hours 10/12/17 0819     10/12/17 0800  cefTRIAXone (ROCEPHIN) 1 g in sodium chloride 0.9 % 100 mL IVPB  Status:  Discontinued    Note to Pharmacy:  START AFTER UA/urine cx/blood cx OBTAINED   1 g 200 mL/hr over 30 Minutes Intravenous Every 24 hours 10/12/17 0720 10/12/17 1052        Objective:   Vitals:   10/12/17 1959 10/13/17 0543 10/13/17 0911 10/13/17 1147  BP: (!) 106/51 104/85 (!) 122/46 (!) 114/54  Pulse: 80 76 82 71  Resp:    18  Temp: 98 F (36.7 C) 98.1 F  (36.7 C) (!) 100.6 F (38.1 C) 98.7 F (37.1 C)  TempSrc:  Oral Oral Oral  SpO2: 94% 96% 94% 95%  Weight:  70 kg (154 lb 6.4 oz)    Height:        Wt Readings from Last 3 Encounters:  10/13/17 70 kg (154 lb 6.4 oz)  01/24/13 66 kg (145 lb 9.6 oz)  11/18/12 65.6 kg (144 lb 9.6 oz)     Intake/Output Summary (Last 24 hours) at 10/13/2017 1655 Last data filed at 10/13/2017 1400 Gross per 24 hour  Intake 3085.03 ml  Output 1800 ml  Net 1285.03 ml     Physical Exam  Gen:- Awake Alert,  In no apparent distress  HEENT:- [place].AT, No sclera icterus Neck-Supple Neck,No JVD,.  Lungs-  CTAB , good movement CV- S1, S2 normal Abd-  +ve B.Sounds, Abd Soft, No tenderness,    Extremity/Skin:- No  edema,   Rt leg previous biopsy site healing Psych-affect is appropriate, oriented x3 Neuro-no new focal deficits, no tremors   Data Review:   Micro Results Recent Results (from the past 240 hour(s))  Culture, blood (Routine X 2) w Reflex to ID Panel     Status: None (Preliminary result)   Collection Time: 10/12/17  7:27 AM  Result Value Ref Range Status   Specimen Description BLOOD BLOOD RIGHT HAND  Final   Special Requests   Final    BOTTLES DRAWN AEROBIC ONLY Blood Culture results may not be optimal due to an inadequate volume of blood received in culture bottles   Culture NO GROWTH 1 DAY  Final   Report Status PENDING  Incomplete  Culture, blood (Routine X 2) w Reflex to ID Panel     Status: None (Preliminary result)   Collection Time: 10/12/17  7:29 AM  Result Value Ref Range Status   Specimen Description BLOOD LEFT ANTECUBITAL  Final   Special Requests   Final    BOTTLES DRAWN AEROBIC AND ANAEROBIC Blood Culture results may not be optimal due to an excessive volume of blood received in culture bottles   Culture NO GROWTH 1 DAY  Final   Report Status PENDING  Incomplete  Culture, Urine     Status: Abnormal (Preliminary result)   Collection Time: 10/12/17  8:06 AM  Result Value Ref  Range Status   Specimen Description URINE, CLEAN CATCH  Final   Special Requests NONE  Final   Culture (A)  Final    >=100,000 COLONIES/mL ESCHERICHIA COLI SUSCEPTIBILITIES TO FOLLOW Performed at [facility], [address] [place], [place] [address]    Report Status PENDING  Incomplete    Radiology Reports Dg Chest 2 View  Result Date: 10/12/2017  CLINICAL DATA:  Chest pain off and on since yesterday. Vomiting. Diagnosed with urinary tract infection at primary care physician today. EXAM: CHEST - 2 VIEW COMPARISON:  09/10/2012 FINDINGS: Shallow inspiration. Heart size and pulmonary vascularity are normal. Central interstitial changes with peribronchial thickening suggesting acute or chronic bronchitis. Blunting of the costophrenic angles suggests small pleural effusions. No focal consolidation or airspace disease. No pneumothorax. Mediastinal contours appear intact. Degenerative changes in the spine and shoulders. IMPRESSION: 1. Bronchitic changes, possibly acute or chronic. No focal consolidation. 2. Probable small bilateral pleural effusions. Electronically Signed   By: Lucienne Capers M.D.   On: 10/12/2017 02:11   Ct Angio Chest Pe W Or Wo Contrast  Addendum Date: 10/12/2017   ADDENDUM REPORT: 10/12/2017 09:06 ADDENDUM: Add to IMPRESSION: Small indeterminate left adrenal mass. This finding may warrant a follow-up study in 1 year to assess stability. If more aggressive surveillance is felt to be warranted, MR pre and post-contrast of the adrenals could be helpful for further assessment. Electronically Signed   By: Lowella Grip III M.D.   On: 10/12/2017 09:06   Result Date: 10/12/2017 CLINICAL DATA:  Chest pain.  Abdominal pain.  Nausea and vomiting. EXAM: CT ANGIOGRAPHY CHEST CT ABDOMEN AND PELVIS WITH CONTRAST TECHNIQUE: Multidetector CT imaging of the chest was performed using the standard protocol during bolus administration of intravenous contrast. Multiplanar CT image  reconstructions and MIPs were obtained to evaluate the vascular anatomy. Multidetector CT imaging of the abdomen and pelvis was performed using the standard protocol during bolus administration of intravenous contrast. CONTRAST:  1107m ISOVUE-370 IOPAMIDOL (ISOVUE-370) INJECTION 76% COMPARISON:  Chest radiograph October 12, 2017; chest CT Sep 10, 2012; CT abdomen and pelvis Sep 23, 2006 FINDINGS: CTA CHEST FINDINGS Cardiovascular: There is no demonstrable pulmonary embolus. There is no thoracic aortic aneurysm or dissection. Visualized great vessels appear unremarkable. There are foci of calcification in the thoracic aorta. There is no appreciable pericardial thickening. Minimal pericardial fluid may be physiologic. Mediastinum/Nodes: There is a mass in the right lobe of the thyroid measuring 1.6 x 1.3 cm. There is a smaller mass in the left lobe of the thyroid measuring 1.1 x 0.7 cm. There are subcentimeter mediastinal lymph nodes. No adenopathy by size criteria evident in the thoracic region. No esophageal lesions are evident. Lungs/Pleura: There is bibasilar atelectatic change. There is a small right pleural effusion. There is no parenchymal lung edema or consolidation. Musculoskeletal: There is degenerative change in the thoracic spine. There are no blastic or lytic bone lesions. No chest wall lesions are evident. Review of the MIP images confirms the above findings. CT ABDOMEN and PELVIS FINDINGS Hepatobiliary: No focal liver lesions are appreciable. Gallbladder wall is not appreciably thickened. There is no biliary duct dilatation. Pancreas: No pancreatic mass or inflammatory focus evident. Spleen: No splenic lesions are appreciable. Adrenals/Urinary Tract: Right adrenal appears normal. There is a 1.2 x 0.9 cm nodular lesion in the left adrenal which must be regarded as indeterminate based on attenuation criteria. There is a cyst arising in the lateral mid left kidney measuring 1.6 x 1.4 cm. There is a 7 x 5 mm  cyst in the anterior inferior left kidney. There is no hydronephrosis on either side. There is no renal or ureteral calculus on either side. Urinary bladder is midline with wall thickness within normal limits. Stomach/Bowel: There is no appreciable bowel wall or mesenteric thickening. There are scattered colonic diverticula without diverticulitis. No evident bowel obstruction. No free air or portal venous  air. Vascular/Lymphatic: There is atherosclerotic calcification in aorta. No aneurysm. Major mesenteric vessels appear patent. No adenopathy is evident in the abdomen or pelvis. Reproductive: Uterus is anteverted. There is a mass either arising from the uterus on the right somewhat inferiorly versus an immediately adjacent right ovarian/adnexal mass. This lesion appears solid and measures 3.2 x 2.7 x 2.7 cm. There is a presumed eccentric leiomyoma arising from the uterus superiorly and anteriorly measuring 1.6 x 1.5 x 1.5 cm. There is minimal fluid in the cul-de-sac region. Other: There is no periappendiceal region abnormality. Appendix not convincingly seen. No abscess or ascites evident in the abdomen or pelvis. There is a small ventral hernia containing only fat. Musculoskeletal: There is degenerative change in the lumbar spine. There is mild lower lumbar levoscoliosis. There are no blastic or lytic bone lesions. No intramuscular or abdominal wall lesion evident. Review of the MIP images confirms the above findings. IMPRESSION: CT angiogram chest: 1. No demonstrable pulmonary embolus. No thoracic aortic aneurysm or dissection. There is aortic atherosclerosis. 2. Bibasilar atelectasis. No edema or consolidation. Small right pleural effusion. 3.  No appreciable thoracic adenopathy. 4. **An incidental finding of potential clinical significance has been found. Dominant mass right lobe thyroid. Consider further evaluation with thyroid ultrasound. If patient is clinically hyperthyroid, consider nuclear medicine  thyroid uptake and scan.** CT abdomen and pelvis: 1. Leiomyomatous uterus. Mass arising either from the right side of the uterus or from the immediately adjacent to the ovary. Advise pelvic ultrasound to further evaluate this solid mass measuring 3.2 x 2.7 x 2.7 cm, in particular to assess uterine versus right adnexal origin. 2. Scattered colonic diverticula without diverticulitis. No bowel obstruction. No abscess. No periappendiceal region inflammation. 3. No evident renal or ureteral calculus. No hydronephrosis appreciable. 4.  Aortic atherosclerosis. 5.  Small ventral hernia containing only fat. Aortic Atherosclerosis (ICD10-I70.0). Electronically Signed: By: Lowella Grip III M.D. On: 10/12/2017 09:02   Ct Abdomen Pelvis W Contrast  Addendum Date: 10/12/2017   ADDENDUM REPORT: 10/12/2017 09:06 ADDENDUM: Add to IMPRESSION: Small indeterminate left adrenal mass. This finding may warrant a follow-up study in 1 year to assess stability. If more aggressive surveillance is felt to be warranted, MR pre and post-contrast of the adrenals could be helpful for further assessment. Electronically Signed   By: Lowella Grip III M.D.   On: 10/12/2017 09:06   Result Date: 10/12/2017 CLINICAL DATA:  Chest pain.  Abdominal pain.  Nausea and vomiting. EXAM: CT ANGIOGRAPHY CHEST CT ABDOMEN AND PELVIS WITH CONTRAST TECHNIQUE: Multidetector CT imaging of the chest was performed using the standard protocol during bolus administration of intravenous contrast. Multiplanar CT image reconstructions and MIPs were obtained to evaluate the vascular anatomy. Multidetector CT imaging of the abdomen and pelvis was performed using the standard protocol during bolus administration of intravenous contrast. CONTRAST:  142m ISOVUE-370 IOPAMIDOL (ISOVUE-370) INJECTION 76% COMPARISON:  Chest radiograph October 12, 2017; chest CT Sep 10, 2012; CT abdomen and pelvis Sep 23, 2006 FINDINGS: CTA CHEST FINDINGS Cardiovascular: There is no  demonstrable pulmonary embolus. There is no thoracic aortic aneurysm or dissection. Visualized great vessels appear unremarkable. There are foci of calcification in the thoracic aorta. There is no appreciable pericardial thickening. Minimal pericardial fluid may be physiologic. Mediastinum/Nodes: There is a mass in the right lobe of the thyroid measuring 1.6 x 1.3 cm. There is a smaller mass in the left lobe of the thyroid measuring 1.1 x 0.7 cm. There are subcentimeter mediastinal lymph nodes. No adenopathy  by size criteria evident in the thoracic region. No esophageal lesions are evident. Lungs/Pleura: There is bibasilar atelectatic change. There is a small right pleural effusion. There is no parenchymal lung edema or consolidation. Musculoskeletal: There is degenerative change in the thoracic spine. There are no blastic or lytic bone lesions. No chest wall lesions are evident. Review of the MIP images confirms the above findings. CT ABDOMEN and PELVIS FINDINGS Hepatobiliary: No focal liver lesions are appreciable. Gallbladder wall is not appreciably thickened. There is no biliary duct dilatation. Pancreas: No pancreatic mass or inflammatory focus evident. Spleen: No splenic lesions are appreciable. Adrenals/Urinary Tract: Right adrenal appears normal. There is a 1.2 x 0.9 cm nodular lesion in the left adrenal which must be regarded as indeterminate based on attenuation criteria. There is a cyst arising in the lateral mid left kidney measuring 1.6 x 1.4 cm. There is a 7 x 5 mm cyst in the anterior inferior left kidney. There is no hydronephrosis on either side. There is no renal or ureteral calculus on either side. Urinary bladder is midline with wall thickness within normal limits. Stomach/Bowel: There is no appreciable bowel wall or mesenteric thickening. There are scattered colonic diverticula without diverticulitis. No evident bowel obstruction. No free air or portal venous air. Vascular/Lymphatic: There is  atherosclerotic calcification in aorta. No aneurysm. Major mesenteric vessels appear patent. No adenopathy is evident in the abdomen or pelvis. Reproductive: Uterus is anteverted. There is a mass either arising from the uterus on the right somewhat inferiorly versus an immediately adjacent right ovarian/adnexal mass. This lesion appears solid and measures 3.2 x 2.7 x 2.7 cm. There is a presumed eccentric leiomyoma arising from the uterus superiorly and anteriorly measuring 1.6 x 1.5 x 1.5 cm. There is minimal fluid in the cul-de-sac region. Other: There is no periappendiceal region abnormality. Appendix not convincingly seen. No abscess or ascites evident in the abdomen or pelvis. There is a small ventral hernia containing only fat. Musculoskeletal: There is degenerative change in the lumbar spine. There is mild lower lumbar levoscoliosis. There are no blastic or lytic bone lesions. No intramuscular or abdominal wall lesion evident. Review of the MIP images confirms the above findings. IMPRESSION: CT angiogram chest: 1. No demonstrable pulmonary embolus. No thoracic aortic aneurysm or dissection. There is aortic atherosclerosis. 2. Bibasilar atelectasis. No edema or consolidation. Small right pleural effusion. 3.  No appreciable thoracic adenopathy. 4. **An incidental finding of potential clinical significance has been found. Dominant mass right lobe thyroid. Consider further evaluation with thyroid ultrasound. If patient is clinically hyperthyroid, consider nuclear medicine thyroid uptake and scan.** CT abdomen and pelvis: 1. Leiomyomatous uterus. Mass arising either from the right side of the uterus or from the immediately adjacent to the ovary. Advise pelvic ultrasound to further evaluate this solid mass measuring 3.2 x 2.7 x 2.7 cm, in particular to assess uterine versus right adnexal origin. 2. Scattered colonic diverticula without diverticulitis. No bowel obstruction. No abscess. No periappendiceal region  inflammation. 3. No evident renal or ureteral calculus. No hydronephrosis appreciable. 4.  Aortic atherosclerosis. 5.  Small ventral hernia containing only fat. Aortic Atherosclerosis (ICD10-I70.0). Electronically Signed: By: Lowella Grip III M.D. On: 10/12/2017 09:02   US Thyroid  Result Date: 10/12/2017 CLINICAL DATA:  Thyroid nodule on chest CT. EXAM: THYROID ULTRASOUND TECHNIQUE: Ultrasound examination of the thyroid gland and adjacent soft tissues was performed. COMPARISON:  Chest CT 10/12/2017 FINDINGS: Parenchymal Echotexture: Mildly heterogenous Isthmus: 0.2 cm Right lobe: 4.0 x 2.4 x 1.4  cm Left lobe: 4.5 x 2.1 x 1.2 cm _________________________________________________________ Estimated total number of nodules >/= 1 cm: 1 Number of spongiform nodules >/=  2 cm not described below (TR1): 0 Number of mixed cystic and solid nodules >/= 1.5 cm not described below ([place]): 0 _________________________________________________________ Nodule # 1: Location: Right; Inferior Maximum size: 2.1 cm; Other 2 dimensions: 2.0 x 1.4 cm Composition: solid/almost completely solid (2) Echogenicity: hypoechoic (2) Shape: not taller-than-wide (0) Margins: smooth (0) Echogenic foci: none (0) ACR TI-RADS total points: 4. ACR TI-RADS risk category: TR4 (4-6 points). ACR TI-RADS recommendations: **Given size (>/= 1.5 cm) and appearance, fine needle aspiration of this moderately suspicious nodule should be considered based on TI-RADS criteria. _________________________________________________________ Nodule # 3: Location: Left; Mid Maximum size: 0.8 cm; Other 2 dimensions: 0.7 x 0.7 cm Composition: solid/almost completely solid (2) Echogenicity: hypoechoic (2) Shape: not taller-than-wide (0) Margins: ill-defined (0) Echogenic foci: none (0) ACR TI-RADS total points: 4. ACR TI-RADS risk category: TR4 (4-6 points). ACR TI-RADS recommendations: Given size (<0.9 cm) and appearance, this nodule does NOT meet TI-RADS criteria for  biopsy or dedicated follow-up. _________________________________________________________ Additional small thyroid nodules that do not meet criteria for biopsy or dedicated follow-up. IMPRESSION: Thyroid nodules. Dominant right thyroid nodule measures up to 2.1 cm. This is a TR 4 nodule and does meet criteria for ultrasound-guided biopsy. The above is in keeping with the ACR TI-RADS recommendations - J Am Coll Radiol 2017;14:587-595. Electronically Signed   By: Markus Daft M.D.   On: 10/12/2017 12:15   US Pelvic Complete With Transvaginal  Result Date: 10/12/2017 CLINICAL DATA:  Follow-up possible uterine mass on the right EXAM: TRANSABDOMINAL AND TRANSVAGINAL ULTRASOUND OF PELVIS TECHNIQUE: Both transabdominal and transvaginal ultrasound examinations of the pelvis were performed. Transabdominal technique was performed for global imaging of the pelvis including uterus, ovaries, adnexal regions, and pelvic cul-de-sac. It was necessary to proceed with endovaginal exam following the transabdominal exam to visualize the ovaries. COMPARISON:  CT from earlier in the same day. FINDINGS: Uterus Measurements: 7.4 x 3.6 x 4.9 cm. Multiple uterine lesions are identified. The largest of these measures 2.8 cm and corresponds to that seen on recent CT examination. Endometrium Thickness: 4 mm.  No focal abnormality visualized. Right ovary Not well visualized. Left ovary Not well visualized Other findings No abnormal free fluid. IMPRESSION: Uterine fibroids consistent with that seen on prior CT examination. No other focal abnormality is noted. Electronically Signed   By: Inez Catalina M.D.   On: 10/12/2017 10:52     CBC Recent Labs  Lab 10/12/17 0154 10/12/17 0603 10/12/17 0729 10/13/17 0248  WBC 1.8*  --  1.2* 2.0*  HGB 12.3  --  10.8* 10.0*  HCT 35.0*  --  30.7* 28.3*  PLT 81* 83* 73* 73*  MCV 84.1  --  84.3 84.0  MCH 29.6  --  29.7 29.7  MCHC 35.1  --  35.2 35.3  RDW 12.0  --  11.9 12.0  LYMPHSABS 0.2*  --   0.2*  --   MONOABS 0.1  --  0.1  --   EOSABS 0.0  --  0.0  --   BASOSABS 0.0  --  0.0  --     Chemistries  Recent Labs  Lab 10/12/17 0154 10/12/17 0729 10/12/17 1435 10/13/17 0248  NA 120* 123* 120* 122*  K 3.9 3.5 3.5 3.6  CL 88* 93* 93* 95*  CO2 22 21* 19* 19*  GLUCOSE 125* 104* 111* 101*  BUN  '8 8 7 7  ' CREATININE 0.71 0.76 0.71 0.70  CALCIUM 8.6* 7.9* 7.6* 7.6*  AST 178*  --  190* 208*  ALT 123*  --  119* 125*  ALKPHOS 211*  --  218* 242*  BILITOT 1.2  --  1.2 1.2   ------------------------------------------------------------------------------------------------------------------ No results for input(s): CHOL, HDL, LDLCALC, TRIG, CHOLHDL, LDLDIRECT in the last 72 hours.  No results found for: HGBA1C ------------------------------------------------------------------------------------------------------------------ Recent Labs    10/12/17 1131  TSH 0.369   ------------------------------------------------------------------------------------------------------------------ Recent Labs    10/12/17 0603 10/12/17 0604  VITAMINB12 5,813*  --   FOLATE  --  33.6  FERRITIN 1,530*  --   TIBC 256  --   IRON 17*  --   RETICCTPCT 1.5  --     Coagulation profile Recent Labs  Lab 10/12/17 0603 10/12/17 1131  INR 1.02 1.22    Recent Labs    10/12/17 0603  DDIMER 11.11*    Cardiac Enzymes No results for input(s): CKMB, TROPONINI, MYOGLOBIN in the last 168 hours.  Invalid input(s): CK ------------------------------------------------------------------------------------------------------------------ No results found for: BNP   Roxan Hockey M.D on 10/13/2017 at 4:55 PM  Between 7am to 7pm - Pager - [phone]  After 7pm go to [email or web address] - password [email or web address]  [facility] -  Office  [phone]   Voice Recognition Viviann Spare dictation system was used to create this note, attempts have been made to correct errors. Please contact the author with questions  and/or clarifications.

## 2017-10-13 NOTE — Care Management Note (Signed)
Case Management Note  Patient Details  Name: Erika [name] MRN: [id number] Date of Birth: June 17, 1945  Subjective/Objective:              Patient from home w family, admitted for Sepsis, possible RMSF. CM will continue to follow for DC planning needs.       Action/Plan:   Expected Discharge Date:                  Expected Discharge Plan:  Home/Self Care  In-House Referral:     Discharge planning Services  CM Consult  Post Acute Care Choice:    Choice offered to:     DME Arranged:    DME Agency:     HH Arranged:    HH Agency:     Status of Service:  In process, will continue to follow  If discussed at Long Length of Stay Meetings, dates discussed:    Additional Comments:  Carles Collet, RN 10/13/2017, 12:34 PM

## 2017-10-13 NOTE — Progress Notes (Signed)
Pt's dressing changed on right lower leg. Aquaphor/guaze/paper tape.

## 2017-10-14 ENCOUNTER — Inpatient Hospital Stay (HOSPITAL_COMMUNITY): Payer: Medicare Other

## 2017-10-14 LAB — COMPREHENSIVE METABOLIC PANEL
ALBUMIN: 2.4 g/dL — AB (ref 3.5–5.0)
ALK PHOS: 275 U/L — AB (ref 38–126)
ALK PHOS: 285 U/L — AB (ref 38–126)
ALT: 126 U/L — AB (ref 14–54)
ALT: 111 U/L — AB (ref 14–54)
AST: 202 U/L — AB (ref 15–41)
AST: 164 U/L — AB (ref 15–41)
Albumin: 2.4 g/dL — ABNORMAL LOW (ref 3.5–5.0)
Anion gap: 11 (ref 5–15)
Anion gap: 10 (ref 5–15)
BILIRUBIN TOTAL: 0.9 mg/dL (ref 0.3–1.2)
BUN: 8 mg/dL (ref 6–20)
BUN: 8 mg/dL (ref 6–20)
CALCIUM: 7.8 mg/dL — AB (ref 8.9–10.3)
CO2: 17 mmol/L — ABNORMAL LOW (ref 22–32)
CO2: 16 mmol/L — ABNORMAL LOW (ref 22–32)
CREATININE: 0.53 mg/dL (ref 0.44–1.00)
CREATININE: 0.56 mg/dL (ref 0.44–1.00)
Calcium: 7.7 mg/dL — ABNORMAL LOW (ref 8.9–10.3)
Chloride: 99 mmol/L — ABNORMAL LOW (ref 101–111)
Chloride: 100 mmol/L — ABNORMAL LOW (ref 101–111)
GFR calc Af Amer: >60 mL/min (ref >60)
GFR calc Af Amer: >60 mL/min (ref >60)
GFR calc non Af Amer: >60 mL/min (ref >60)
GLUCOSE: 88 mg/dL (ref 65–99)
GLUCOSE: 94 mg/dL (ref 65–99)
Potassium: 3.4 mmol/L — ABNORMAL LOW (ref 3.5–5.1)
Potassium: 3.3 mmol/L — ABNORMAL LOW (ref 3.5–5.1)
Sodium: 127 mmol/L — ABNORMAL LOW (ref 135–145)
Sodium: 126 mmol/L — ABNORMAL LOW (ref 135–145)
TOTAL PROTEIN: 4.6 g/dL — AB (ref 6.5–8.1)
TOTAL PROTEIN: 4.4 g/dL — AB (ref 6.5–8.1)
Total Bilirubin: 1 mg/dL (ref 0.3–1.2)

## 2017-10-14 LAB — URINE CULTURE

## 2017-10-14 LAB — HEPATITIS PANEL, ACUTE
HCV Ab: <0.1 s/co ratio (ref 0.0–0.9)
Hep A IgM: NEGATIVE
Hep B C IgM: NEGATIVE
Hepatitis B Surface Ag: NEGATIVE

## 2017-10-14 LAB — ROCKY MTN SPOTTED FVR ABS PNL(IGG+IGM)
RMSF IGG: NEGATIVE
RMSF IGM: 0.16 {index} (ref 0.00–0.89)

## 2017-10-14 MED ORDER — CEPHALEXIN 500 MG PO CAPS
500.0000 mg | ORAL_CAPSULE | Freq: Three times a day (TID) | ORAL | Status: DC
  Administered 2017-10-14 – 2017-10-15 (×4): 500 mg via ORAL
  Filled 2017-10-14 (×5): qty 1

## 2017-10-14 NOTE — Consult Note (Signed)
[facility] CM Primary Care Navigator  10/14/2017  Erika [name] Sep 27, 1945 [id number]   Met withpatientand son at the bedsideto identify possible discharge needs. Son reports that patient had "nausea/ vomiting, fever and weakness"that had led to this admission.(sepsis secondary to urinary tract infection) . PatientendorsesDr. Dibas Koirala with [place] [facility] as herprimary care provider.   [facility] [address] [place] [facility] without any problem.   Patient reports managing her own medications at homeusing "pill box" systemfilled once a week.  Patient reportsthatshe was driving prior to admission but son and daughter in-law will be able to providetransportation toherdoctors'appointments if needed after discharge.  Son verbalized that patient lives with her son Erika [name]) and daughter in-law Erika [name]) who will be the primary caregivers at home.  Anticipated discharge plan ishomeaccording to patient.  Patient and sonvoiced understanding to call primary care provider's office whenshereturns home for a post discharge follow-up appointment within1- 2 weeksor sooner if needs arise.Patient letter (with PCP's contact number) was provided asareminder.  Explained topatient and sonregardingTHN CM services available for health management/ resourcesat home butpatient denies any needs or concerns at this point.  She declinedTHNCMservices offered which includesEMMI calls to follow-up with herrecovery.  Patient and son expressed understanding to seekreferral to [facility] care managementfrom primary care providerifdeemed necessary and appropriate for anyservicesin thefuture.   [facility] care management information provided for future needs that may arise.  Primary care provider's office is listed as providing transition of care (TOC) follow-up.    For additional questions please  contact:  Edwena Felty A. Jorgia Manthei, BSN, RN-BC [facility] PRIMARY CARE Navigator Cell: [phone]

## 2017-10-14 NOTE — Progress Notes (Signed)
Patient Demographics:    Erika [name], is a 72 y.o. female, DOB - [birth year], PFX:[id number]  Admit date - 10/12/2017   Admitting Physician Karmen Bongo, MD  Outpatient Primary MD for the patient is Erika Amel, MD  LOS - 2   Chief Complaint  Patient presents with  . Chest Pain        Subjective:    Erika [name] today has no fevers, no emesis,  No chest pain, older son at bedside, became tachycardic after ambulating earlier, back in sinus rhythm now  Assessment  & Plan :    Principal Problem:   Sepsis secondary to UTI [facility]) Active Problems:   Leukopenia   Thrombocytopenia (HCC)   Elevated transaminase level   Acute hyponatremia   Hypertension   Positive D dimer   Recurrent UTI   GERD (gastroesophageal reflux disease)   Arthritis   Tick bites  Brief Summary:- 72 y.o. female with a Past Medical History of recurrent UTI; HTN; GERD; and chronic hyponatremia admitted on 10/12/2017 with sodium of 120, malaise and fatigue as well as poor appetite and nausea, also reported tick bite found to be thrombocytopenic/pancytopenic   Plan 1)E coli sepsis and UTI-  procalcitonin was 1.3, received  iv Rocephin, okay to switch to p.o. Keflex per sensitivity, no further fevers.  Pancytopenia noted, , patient has a history of recurrent UTIs, she does follow regularly with urology,   2)HypoNatremia--she does admit to drinking up to 6 L of water per day, she drinks over 6 L of water daily and attempt to "flush her kidneys", improving, sodium is up to 127 with water restriction and iv NS, patient also has a history of severe hyponatremia previously when she was taking HCTZ.  Free water restriction advised, decrease NS to 75 mL /hr, follow serial sodium levels, no significant mental status changes at this time  3)Elevated LFTs with pancytopenia----CT abdomen and pelvis without acute findings, acute viral hepatitis  panel negative,  dedicated liver ultrasound without acute findings  4) 2.1 cm right-sided thyroid nodule--TSH and free T4 normal, follow-up as outpatient for possible thyroid biopsy  5) uterine fibroid-asymptomatic follow-up as outpatient  6)Tick Bites -initially treated with IV Doxycycline for empiric coverage of tick-borne illness, however RMSF and ehrlichiosis test results are negative so we will discontinue doxycycline  7)Pancytopenia--- patient will need outpatient hematology follow-up including possible bone marrow biopsy if pancytopenia persists despite treatment of underlying infection, may be related to hepatitis, elevated LFTs noted however viral hepatitis profile is negative, and CT abdomen and pelvis as well as liver ultrasound without significant liver abnormalities  Code Status : full  Disposition Plan  : home    DVT Prophylaxis  :   SCDs   Lab Results  Component Value Date   PLT 73 (L) 10/13/2017    Inpatient Medications  Scheduled Meds: . cephALEXin  500 mg Oral Q8H  . fluticasone  1 spray Each Nare Daily  . pantoprazole  40 mg Oral Daily  . sodium chloride flush  3 mL Intravenous Q12H   Continuous Infusions: . sodium chloride 100 mL/hr at 10/14/17 0602  . famotidine (PEPCID) IV 20 mg (10/14/17 1013)   PRN Meds:.acetaminophen **OR** acetaminophen, gi cocktail, LORazepam, ondansetron (ZOFRAN) IV  Anti-infectives (From admission, onward)   Start     Dose/Rate Route Frequency Ordered Stop   10/14/17 1400  cephALEXin (KEFLEX) capsule 500 mg     500 mg Oral Every 8 hours 10/14/17 1049     10/13/17 1100  cefTRIAXone (ROCEPHIN) 1 g in sodium chloride 0.9 % 100 mL IVPB  Status:  Discontinued     1 g 200 mL/hr over 30 Minutes Intravenous Every 24 hours 10/13/17 1050 10/14/17 1049   10/12/17 0830  doxycycline (VIBRAMYCIN) 100 mg in sodium chloride 0.9 % 250 mL IVPB  Status:  Discontinued     100 mg 125 mL/hr over 120 Minutes Intravenous Every 12 hours 10/12/17  0819 10/14/17 1049   10/12/17 0800  cefTRIAXone (ROCEPHIN) 1 g in sodium chloride 0.9 % 100 mL IVPB  Status:  Discontinued    Note to Pharmacy:  START AFTER UA/urine cx/blood cx OBTAINED   1 g 200 mL/hr over 30 Minutes Intravenous Every 24 hours 10/12/17 0720 10/12/17 1052        Objective:   Vitals:   10/14/17 0508 10/14/17 0811 10/14/17 1035 10/14/17 1154  BP: (!) 147/78 137/81  131/76  Pulse: 76 82  82  Resp: '18 18  20  ' Temp: 99 F (37.2 C) 99 F (37.2 C) 98.2 F (36.8 C) 98.9 F (37.2 C)  TempSrc: Oral Oral Oral Oral  SpO2: 98% 97%  95%  Weight: 70.8 kg (156 lb 1.6 oz)     Height:        Wt Readings from Last 3 Encounters:  10/14/17 70.8 kg (156 lb 1.6 oz)  01/24/13 66 kg (145 lb 9.6 oz)  11/18/12 65.6 kg (144 lb 9.6 oz)     Intake/Output Summary (Last 24 hours) at 10/14/2017 1413 Last data filed at 10/14/2017 1156 Gross per 24 hour  Intake 2791.1 ml  Output 1200 ml  Net 1591.1 ml     Physical Exam  Gen:- Awake Alert,  In no apparent distress  HEENT:- [place].AT, No sclera icterus Neck-Supple Neck,No JVD,.  Lungs-  CTAB , good movement CV- S1, S2 normal Abd-  +ve B.Sounds, Abd Soft, No tenderness,    Extremity/Skin:- No  edema,   Rt leg previous biopsy site healing Psych-affect is appropriate, oriented x3 Neuro-no new focal deficits, no tremors   Data Review:   Micro Results Recent Results (from the past 240 hour(s))  Culture, blood (Routine X 2) w Reflex to ID Panel     Status: None (Preliminary result)   Collection Time: 10/12/17  7:27 AM  Result Value Ref Range Status   Specimen Description BLOOD BLOOD RIGHT HAND  Final   Special Requests   Final    BOTTLES DRAWN AEROBIC ONLY Blood Culture results may not be optimal due to an inadequate volume of blood received in culture bottles   Culture NO GROWTH 1 DAY  Final   Report Status PENDING  Incomplete  Culture, blood (Routine X 2) w Reflex to ID Panel     Status: None (Preliminary result)   Collection  Time: 10/12/17  7:29 AM  Result Value Ref Range Status   Specimen Description BLOOD LEFT ANTECUBITAL  Final   Special Requests   Final    BOTTLES DRAWN AEROBIC AND ANAEROBIC Blood Culture results may not be optimal due to an excessive volume of blood received in culture bottles   Culture NO GROWTH 1 DAY  Final   Report Status PENDING  Incomplete  Culture, Urine  Status: Abnormal   Collection Time: 10/12/17  8:06 AM  Result Value Ref Range Status   Specimen Description URINE, CLEAN CATCH  Final   Special Requests   Final    NONE Performed at [facility], [address], [place], [place] [address]    Culture >=100,000 COLONIES/mL ESCHERICHIA COLI (A)  Final   Report Status 10/14/2017 FINAL  Final   Organism ID, Bacteria ESCHERICHIA COLI (A)  Final      Susceptibility   Escherichia coli - MIC*    AMPICILLIN >=32 RESISTANT Resistant     CEFAZOLIN <=4 SENSITIVE Sensitive     CEFTRIAXONE <=1 SENSITIVE Sensitive     CIPROFLOXACIN >=4 RESISTANT Resistant     GENTAMICIN <=1 SENSITIVE Sensitive     IMIPENEM 0.5 SENSITIVE Sensitive     NITROFURANTOIN <=16 SENSITIVE Sensitive     TRIMETH/SULFA >=320 RESISTANT Resistant     AMPICILLIN/SULBACTAM 8 SENSITIVE Sensitive     PIP/TAZO <=4 SENSITIVE Sensitive     Extended ESBL NEGATIVE Sensitive     * >=100,000 COLONIES/mL ESCHERICHIA COLI    Radiology Reports Dg Chest 2 View  Result Date: 10/12/2017 CLINICAL DATA:  Chest pain off and on since yesterday. Vomiting. Diagnosed with urinary tract infection at primary care physician today. EXAM: CHEST - 2 VIEW COMPARISON:  09/10/2012 FINDINGS: Shallow inspiration. Heart size and pulmonary vascularity are normal. Central interstitial changes with peribronchial thickening suggesting acute or chronic bronchitis. Blunting of the costophrenic angles suggests small pleural effusions. No focal consolidation or airspace disease. No pneumothorax. Mediastinal contours appear intact. Degenerative changes  in the spine and shoulders. IMPRESSION: 1. Bronchitic changes, possibly acute or chronic. No focal consolidation. 2. Probable small bilateral pleural effusions. Electronically Signed   By: Lucienne Capers M.D.   On: 10/12/2017 02:11   Ct Angio Chest Pe W Or Wo Contrast  Addendum Date: 10/12/2017   ADDENDUM REPORT: 10/12/2017 09:06 ADDENDUM: Add to IMPRESSION: Small indeterminate left adrenal mass. This finding may warrant a follow-up study in 1 year to assess stability. If more aggressive surveillance is felt to be warranted, MR pre and post-contrast of the adrenals could be helpful for further assessment. Electronically Signed   By: Lowella Grip III M.D.   On: 10/12/2017 09:06   Result Date: 10/12/2017 CLINICAL DATA:  Chest pain.  Abdominal pain.  Nausea and vomiting. EXAM: CT ANGIOGRAPHY CHEST CT ABDOMEN AND PELVIS WITH CONTRAST TECHNIQUE: Multidetector CT imaging of the chest was performed using the standard protocol during bolus administration of intravenous contrast. Multiplanar CT image reconstructions and MIPs were obtained to evaluate the vascular anatomy. Multidetector CT imaging of the abdomen and pelvis was performed using the standard protocol during bolus administration of intravenous contrast. CONTRAST:  146m ISOVUE-370 IOPAMIDOL (ISOVUE-370) INJECTION 76% COMPARISON:  Chest radiograph October 12, 2017; chest CT Sep 10, 2012; CT abdomen and pelvis Sep 23, 2006 FINDINGS: CTA CHEST FINDINGS Cardiovascular: There is no demonstrable pulmonary embolus. There is no thoracic aortic aneurysm or dissection. Visualized great vessels appear unremarkable. There are foci of calcification in the thoracic aorta. There is no appreciable pericardial thickening. Minimal pericardial fluid may be physiologic. Mediastinum/Nodes: There is a mass in the right lobe of the thyroid measuring 1.6 x 1.3 cm. There is a smaller mass in the left lobe of the thyroid measuring 1.1 x 0.7 cm. There are subcentimeter  mediastinal lymph nodes. No adenopathy by size criteria evident in the thoracic region. No esophageal lesions are evident. Lungs/Pleura: There is bibasilar atelectatic  change. There is a small right pleural effusion. There is no parenchymal lung edema or consolidation. Musculoskeletal: There is degenerative change in the thoracic spine. There are no blastic or lytic bone lesions. No chest wall lesions are evident. Review of the MIP images confirms the above findings. CT ABDOMEN and PELVIS FINDINGS Hepatobiliary: No focal liver lesions are appreciable. Gallbladder wall is not appreciably thickened. There is no biliary duct dilatation. Pancreas: No pancreatic mass or inflammatory focus evident. Spleen: No splenic lesions are appreciable. Adrenals/Urinary Tract: Right adrenal appears normal. There is a 1.2 x 0.9 cm nodular lesion in the left adrenal which must be regarded as indeterminate based on attenuation criteria. There is a cyst arising in the lateral mid left kidney measuring 1.6 x 1.4 cm. There is a 7 x 5 mm cyst in the anterior inferior left kidney. There is no hydronephrosis on either side. There is no renal or ureteral calculus on either side. Urinary bladder is midline with wall thickness within normal limits. Stomach/Bowel: There is no appreciable bowel wall or mesenteric thickening. There are scattered colonic diverticula without diverticulitis. No evident bowel obstruction. No free air or portal venous air. Vascular/Lymphatic: There is atherosclerotic calcification in aorta. No aneurysm. Major mesenteric vessels appear patent. No adenopathy is evident in the abdomen or pelvis. Reproductive: Uterus is anteverted. There is a mass either arising from the uterus on the right somewhat inferiorly versus an immediately adjacent right ovarian/adnexal mass. This lesion appears solid and measures 3.2 x 2.7 x 2.7 cm. There is a presumed eccentric leiomyoma arising from the uterus superiorly and anteriorly measuring  1.6 x 1.5 x 1.5 cm. There is minimal fluid in the cul-de-sac region. Other: There is no periappendiceal region abnormality. Appendix not convincingly seen. No abscess or ascites evident in the abdomen or pelvis. There is a small ventral hernia containing only fat. Musculoskeletal: There is degenerative change in the lumbar spine. There is mild lower lumbar levoscoliosis. There are no blastic or lytic bone lesions. No intramuscular or abdominal wall lesion evident. Review of the MIP images confirms the above findings. IMPRESSION: CT angiogram chest: 1. No demonstrable pulmonary embolus. No thoracic aortic aneurysm or dissection. There is aortic atherosclerosis. 2. Bibasilar atelectasis. No edema or consolidation. Small right pleural effusion. 3.  No appreciable thoracic adenopathy. 4. **An incidental finding of potential clinical significance has been found. Dominant mass right lobe thyroid. Consider further evaluation with thyroid ultrasound. If patient is clinically hyperthyroid, consider nuclear medicine thyroid uptake and scan.** CT abdomen and pelvis: 1. Leiomyomatous uterus. Mass arising either from the right side of the uterus or from the immediately adjacent to the ovary. Advise pelvic ultrasound to further evaluate this solid mass measuring 3.2 x 2.7 x 2.7 cm, in particular to assess uterine versus right adnexal origin. 2. Scattered colonic diverticula without diverticulitis. No bowel obstruction. No abscess. No periappendiceal region inflammation. 3. No evident renal or ureteral calculus. No hydronephrosis appreciable. 4.  Aortic atherosclerosis. 5.  Small ventral hernia containing only fat. Aortic Atherosclerosis (ICD10-I70.0). Electronically Signed: By: Lowella Grip III M.D. On: 10/12/2017 09:02   Ct Abdomen Pelvis W Contrast  Addendum Date: 10/12/2017   ADDENDUM REPORT: 10/12/2017 09:06 ADDENDUM: Add to IMPRESSION: Small indeterminate left adrenal mass. This finding may warrant a follow-up study  in 1 year to assess stability. If more aggressive surveillance is felt to be warranted, MR pre and post-contrast of the adrenals could be helpful for further assessment. Electronically Signed   By: Lowella Grip III  M.D.   On: 10/12/2017 09:06   Result Date: 10/12/2017 CLINICAL DATA:  Chest pain.  Abdominal pain.  Nausea and vomiting. EXAM: CT ANGIOGRAPHY CHEST CT ABDOMEN AND PELVIS WITH CONTRAST TECHNIQUE: Multidetector CT imaging of the chest was performed using the standard protocol during bolus administration of intravenous contrast. Multiplanar CT image reconstructions and MIPs were obtained to evaluate the vascular anatomy. Multidetector CT imaging of the abdomen and pelvis was performed using the standard protocol during bolus administration of intravenous contrast. CONTRAST:  122m ISOVUE-370 IOPAMIDOL (ISOVUE-370) INJECTION 76% COMPARISON:  Chest radiograph October 12, 2017; chest CT Sep 10, 2012; CT abdomen and pelvis Sep 23, 2006 FINDINGS: CTA CHEST FINDINGS Cardiovascular: There is no demonstrable pulmonary embolus. There is no thoracic aortic aneurysm or dissection. Visualized great vessels appear unremarkable. There are foci of calcification in the thoracic aorta. There is no appreciable pericardial thickening. Minimal pericardial fluid may be physiologic. Mediastinum/Nodes: There is a mass in the right lobe of the thyroid measuring 1.6 x 1.3 cm. There is a smaller mass in the left lobe of the thyroid measuring 1.1 x 0.7 cm. There are subcentimeter mediastinal lymph nodes. No adenopathy by size criteria evident in the thoracic region. No esophageal lesions are evident. Lungs/Pleura: There is bibasilar atelectatic change. There is a small right pleural effusion. There is no parenchymal lung edema or consolidation. Musculoskeletal: There is degenerative change in the thoracic spine. There are no blastic or lytic bone lesions. No chest wall lesions are evident. Review of the MIP images confirms the  above findings. CT ABDOMEN and PELVIS FINDINGS Hepatobiliary: No focal liver lesions are appreciable. Gallbladder wall is not appreciably thickened. There is no biliary duct dilatation. Pancreas: No pancreatic mass or inflammatory focus evident. Spleen: No splenic lesions are appreciable. Adrenals/Urinary Tract: Right adrenal appears normal. There is a 1.2 x 0.9 cm nodular lesion in the left adrenal which must be regarded as indeterminate based on attenuation criteria. There is a cyst arising in the lateral mid left kidney measuring 1.6 x 1.4 cm. There is a 7 x 5 mm cyst in the anterior inferior left kidney. There is no hydronephrosis on either side. There is no renal or ureteral calculus on either side. Urinary bladder is midline with wall thickness within normal limits. Stomach/Bowel: There is no appreciable bowel wall or mesenteric thickening. There are scattered colonic diverticula without diverticulitis. No evident bowel obstruction. No free air or portal venous air. Vascular/Lymphatic: There is atherosclerotic calcification in aorta. No aneurysm. Major mesenteric vessels appear patent. No adenopathy is evident in the abdomen or pelvis. Reproductive: Uterus is anteverted. There is a mass either arising from the uterus on the right somewhat inferiorly versus an immediately adjacent right ovarian/adnexal mass. This lesion appears solid and measures 3.2 x 2.7 x 2.7 cm. There is a presumed eccentric leiomyoma arising from the uterus superiorly and anteriorly measuring 1.6 x 1.5 x 1.5 cm. There is minimal fluid in the cul-de-sac region. Other: There is no periappendiceal region abnormality. Appendix not convincingly seen. No abscess or ascites evident in the abdomen or pelvis. There is a small ventral hernia containing only fat. Musculoskeletal: There is degenerative change in the lumbar spine. There is mild lower lumbar levoscoliosis. There are no blastic or lytic bone lesions. No intramuscular or abdominal wall  lesion evident. Review of the MIP images confirms the above findings. IMPRESSION: CT angiogram chest: 1. No demonstrable pulmonary embolus. No thoracic aortic aneurysm or dissection. There is aortic atherosclerosis. 2. Bibasilar atelectasis. No edema  or consolidation. Small right pleural effusion. 3.  No appreciable thoracic adenopathy. 4. **An incidental finding of potential clinical significance has been found. Dominant mass right lobe thyroid. Consider further evaluation with thyroid ultrasound. If patient is clinically hyperthyroid, consider nuclear medicine thyroid uptake and scan.** CT abdomen and pelvis: 1. Leiomyomatous uterus. Mass arising either from the right side of the uterus or from the immediately adjacent to the ovary. Advise pelvic ultrasound to further evaluate this solid mass measuring 3.2 x 2.7 x 2.7 cm, in particular to assess uterine versus right adnexal origin. 2. Scattered colonic diverticula without diverticulitis. No bowel obstruction. No abscess. No periappendiceal region inflammation. 3. No evident renal or ureteral calculus. No hydronephrosis appreciable. 4.  Aortic atherosclerosis. 5.  Small ventral hernia containing only fat. Aortic Atherosclerosis (ICD10-I70.0). Electronically Signed: By: Lowella Grip III M.D. On: 10/12/2017 09:02   US Liver Doppler  Result Date: 10/14/2017 CLINICAL DATA:  Abnormal liver function test. EXAM: DUPLEX ULTRASOUND OF LIVER TECHNIQUE: Color and duplex Doppler ultrasound was performed to evaluate the hepatic in-flow and out-flow vessels. COMPARISON:  None. FINDINGS: Liver: Normal parenchymal echogenicity. Normal hepatic contour without nodularity. No focal lesion, mass or intrahepatic biliary ductal dilatation. Portal Vein Velocities Main:  46 cm/sec Right:  27.9 cm/sec Left:  26.9 cm/sec Normal hepatopetal flow is noted in the portal veins. Hepatic Vein Velocities Right:  35 cm/sec Middle:  22.4 cm/sec Left:  18.6 cm/sec Normal hepatopetal flow is  noted in the hepatic veins. IVC: Present and patent with normal respiratory phasicity. Hepatic Artery Velocity:  95.6 cm/sec Splenic Vein Velocity:  41.8 cm/sec Varices: Absent. Ascites: Absent. Spleen appears to be normal in size and appearance. No gallstones are noted, but mild gallbladder wall thickening is noted. 6 mm gallbladder polyp is noted as well. IMPRESSION: No evidence of hepatic, portal or splenic venous thrombosis or occlusion is noted. Mild gallbladder wall thickening is noted without cholelithiasis. If there is clinical concern for cholecystitis, HIDA scan may be performed for further evaluation. 6 mm gallbladder polyp is noted. Follow-up ultrasound in 1 year is recommended to ensure stability and rule out neoplasm. Electronically Signed   By: Marijo Conception, M.D.   On: 10/14/2017 09:48   US Thyroid  Result Date: 10/12/2017 CLINICAL DATA:  Thyroid nodule on chest CT. EXAM: THYROID ULTRASOUND TECHNIQUE: Ultrasound examination of the thyroid gland and adjacent soft tissues was performed. COMPARISON:  Chest CT 10/12/2017 FINDINGS: Parenchymal Echotexture: Mildly heterogenous Isthmus: 0.2 cm Right lobe: 4.0 x 2.4 x 1.4 cm Left lobe: 4.5 x 2.1 x 1.2 cm _________________________________________________________ Estimated total number of nodules >/= 1 cm: 1 Number of spongiform nodules >/=  2 cm not described below (TR1): 0 Number of mixed cystic and solid nodules >/= 1.5 cm not described below ([place]): 0 _________________________________________________________ Nodule # 1: Location: Right; Inferior Maximum size: 2.1 cm; Other 2 dimensions: 2.0 x 1.4 cm Composition: solid/almost completely solid (2) Echogenicity: hypoechoic (2) Shape: not taller-than-wide (0) Margins: smooth (0) Echogenic foci: none (0) ACR TI-RADS total points: 4. ACR TI-RADS risk category: TR4 (4-6 points). ACR TI-RADS recommendations: **Given size (>/= 1.5 cm) and appearance, fine needle aspiration of this moderately suspicious nodule  should be considered based on TI-RADS criteria. _________________________________________________________ Nodule # 3: Location: Left; Mid Maximum size: 0.8 cm; Other 2 dimensions: 0.7 x 0.7 cm Composition: solid/almost completely solid (2) Echogenicity: hypoechoic (2) Shape: not taller-than-wide (0) Margins: ill-defined (0) Echogenic foci: none (0) ACR TI-RADS total points: 4. ACR TI-RADS risk category: TR4 (  4-6 points). ACR TI-RADS recommendations: Given size (<0.9 cm) and appearance, this nodule does NOT meet TI-RADS criteria for biopsy or dedicated follow-up. _________________________________________________________ Additional small thyroid nodules that do not meet criteria for biopsy or dedicated follow-up. IMPRESSION: Thyroid nodules. Dominant right thyroid nodule measures up to 2.1 cm. This is a TR 4 nodule and does meet criteria for ultrasound-guided biopsy. The above is in keeping with the ACR TI-RADS recommendations - J Am Coll Radiol 2017;14:587-595. Electronically Signed   By: Markus Daft M.D.   On: 10/12/2017 12:15   US Pelvic Complete With Transvaginal  Result Date: 10/12/2017 CLINICAL DATA:  Follow-up possible uterine mass on the right EXAM: TRANSABDOMINAL AND TRANSVAGINAL ULTRASOUND OF PELVIS TECHNIQUE: Both transabdominal and transvaginal ultrasound examinations of the pelvis were performed. Transabdominal technique was performed for global imaging of the pelvis including uterus, ovaries, adnexal regions, and pelvic cul-de-sac. It was necessary to proceed with endovaginal exam following the transabdominal exam to visualize the ovaries. COMPARISON:  CT from earlier in the same day. FINDINGS: Uterus Measurements: 7.4 x 3.6 x 4.9 cm. Multiple uterine lesions are identified. The largest of these measures 2.8 cm and corresponds to that seen on recent CT examination. Endometrium Thickness: 4 mm.  No focal abnormality visualized. Right ovary Not well visualized. Left ovary Not well visualized Other  findings No abnormal free fluid. IMPRESSION: Uterine fibroids consistent with that seen on prior CT examination. No other focal abnormality is noted. Electronically Signed   By: Inez Catalina M.D.   On: 10/12/2017 10:52     CBC Recent Labs  Lab 10/12/17 0154 10/12/17 0603 10/12/17 0729 10/13/17 0248  WBC 1.8*  --  1.2* 2.0*  HGB 12.3  --  10.8* 10.0*  HCT 35.0*  --  30.7* 28.3*  PLT 81* 83* 73* 73*  MCV 84.1  --  84.3 84.0  MCH 29.6  --  29.7 29.7  MCHC 35.1  --  35.2 35.3  RDW 12.0  --  11.9 12.0  LYMPHSABS 0.2*  --  0.2*  --   MONOABS 0.1  --  0.1  --   EOSABS 0.0  --  0.0  --   BASOSABS 0.0  --  0.0  --     Chemistries  Recent Labs  Lab 10/12/17 0154 10/12/17 0729 10/12/17 1435 10/13/17 0248 10/13/17 1512 10/14/17 0346  NA 120* 123* 120* 122* 124* 127*  K 3.9 3.5 3.5 3.6 3.6 3.4*  CL 88* 93* 93* 95* 97* 99*  CO2 22 21* 19* 19* 19* 17*  GLUCOSE 125* 104* 111* 101* 94 88  BUN '8 8 7 7 8 8  ' CREATININE 0.71 0.76 0.71 0.70 0.61 0.53  CALCIUM 8.6* 7.9* 7.6* 7.6* 7.7* 7.8*  AST 178*  --  190* 208* 230* 202*  ALT 123*  --  119* 125* 133* 126*  ALKPHOS 211*  --  218* 242* 266* 275*  BILITOT 1.2  --  1.2 1.2 1.1 1.0   ------------------------------------------------------------------------------------------------------------------ No results for input(s): CHOL, HDL, LDLCALC, TRIG, CHOLHDL, LDLDIRECT in the last 72 hours.  No results found for: HGBA1C ------------------------------------------------------------------------------------------------------------------ Recent Labs    10/12/17 1131  TSH 0.369   ------------------------------------------------------------------------------------------------------------------ Recent Labs    10/12/17 0603 10/12/17 0604  VITAMINB12 5,813*  --   FOLATE  --  33.6  FERRITIN 1,530*  --   TIBC 256  --   IRON 17*  --   RETICCTPCT 1.5  --     Coagulation profile Recent Labs  Lab 10/12/17  4142 10/12/17 1131  INR 1.02  1.22    Recent Labs    10/12/17 0603  DDIMER 11.11*    Cardiac Enzymes No results for input(s): CKMB, TROPONINI, MYOGLOBIN in the last 168 hours.  Invalid input(s): CK ------------------------------------------------------------------------------------------------------------------ No results found for: BNP   Roxan Hockey M.D on 10/14/2017 at 2:13 PM  Between 7am to 7pm - Pager - [phone]  After 7pm go to [email or web address] - password [email or web address]  [facility] -  Office  [phone]   Voice Recognition Viviann Spare dictation system was used to create this note, attempts have been made to correct errors. Please contact the author with questions and/or clarifications.

## 2017-10-14 NOTE — Progress Notes (Signed)
Pt has converted to A fib with HR 110s-150s after ambulating to restroom x2 this AM. Pt had returned to recliner when RN came into room. Will page MD and continue to monitor.

## 2017-10-15 DIAGNOSIS — Z7901 Long term (current) use of anticoagulants: Secondary | ICD-10-CM | POA: Insufficient documentation

## 2017-10-15 DIAGNOSIS — I480 Paroxysmal atrial fibrillation: Secondary | ICD-10-CM | POA: Diagnosis present

## 2017-10-15 LAB — BASIC METABOLIC PANEL
Anion gap: 7 (ref 5–15)
BUN: 10 mg/dL (ref 6–20)
CHLORIDE: 102 mmol/L (ref 101–111)
CO2: 18 mmol/L — ABNORMAL LOW (ref 22–32)
Calcium: 7.5 mg/dL — ABNORMAL LOW (ref 8.9–10.3)
Creatinine, Ser: 0.62 mg/dL (ref 0.44–1.00)
GFR calc Af Amer: >60 mL/min (ref >60)
GFR calc non Af Amer: >60 mL/min (ref >60)
GLUCOSE: 83 mg/dL (ref 65–99)
POTASSIUM: 3.3 mmol/L — AB (ref 3.5–5.1)
Sodium: 127 mmol/L — ABNORMAL LOW (ref 135–145)

## 2017-10-15 LAB — MAGNESIUM: Magnesium: 1.8 mg/dL (ref 1.7–2.4)

## 2017-10-15 MED ORDER — POTASSIUM CHLORIDE CRYS ER 20 MEQ PO TBCR
40.0000 meq | EXTENDED_RELEASE_TABLET | Freq: Once | ORAL | Status: AC
  Administered 2017-10-15: 40 meq via ORAL
  Filled 2017-10-15: qty 2

## 2017-10-15 MED ORDER — APIXABAN 5 MG PO TABS: 5.0000 mg | ORAL_TABLET | Freq: Two times a day (BID) | ORAL | 5 refills | Status: DC

## 2017-10-15 MED ORDER — FAMOTIDINE 20 MG PO TABS
20.0000 mg | ORAL_TABLET | Freq: Two times a day (BID) | ORAL | Status: DC
  Administered 2017-10-15: 20 mg via ORAL
  Filled 2017-10-15: qty 1

## 2017-10-15 MED ORDER — CEPHALEXIN 500 MG PO CAPS: 500.0000 mg | ORAL_CAPSULE | Freq: Three times a day (TID) | ORAL | 0 refills | Status: DC

## 2017-10-15 NOTE — Discharge Instructions (Addendum)
1)Hold off on starting Apixaban  for stroke prevention (blood Thinner)--- due to concerns about low Platelet count at this time. 2)Avoid ibuprofen/Advil/Aleve/Motrin/Goody Powders/Naproxen/BC powders/Meloxicam/Diclofenac/Indomethacin and other Nonsteroidal anti-inflammatory medications as these will make you more likely to bleed and can cause stomach ulcers, can also cause Kidney problems.  3)Take Cephalexin 500 mg for 5 days for E. coli urinary tract infection 4)Follow-up with your doctor for recheck of your sodium (BMP test) and CBC in 3 to 5 days  5) avoid drinking more than 1 L of water per day, ok to drink  other liquids (Limit Total  fluid intake to no more than 2 L/day, but limit free water intake to no more than 1 L/day) 6) please call if any concerns about bleeding 7) you have an irregular heartbeat also called atrial fibrillation 8) Repeat blood count/CBC in 3 to 5 days advised, if your blood counts remain low your primary care doctor will need to send you to a hematologist for further investigations including possibly a bone marrow biopsy to try to figure out why your blood counts including your platelets are low 9) ultrasound shows right sided thyroid nodule outpatient follow-up for possible thyroid biopsy advised 10) uterine fibroids--- follow-up with a primary care doctor for further monitoring and possible investigations if problems arise

## 2017-10-15 NOTE — Care Management Important Message (Signed)
Important Message  Patient Details  Name: Erika [name] MRN: [id number] Date of Birth: 1945-12-30   Medicare Important Message Given:  Yes    Barb Merino Meara Wiechman 10/15/2017, 12:40 PM

## 2017-10-15 NOTE — Progress Notes (Signed)
Patient provided with 30 day Eliquis card.

## 2017-10-15 NOTE — Discharge Summary (Signed)
Erika [name], is a 72 y.o. female  DOB 1945-10-21  MRN [id number].  Admission date:  10/12/2017  Admitting Physician  Karmen Bongo, MD  Discharge Date:  10/15/2017   Primary MD  [name], MD  Recommendations for primary care physician for things to follow:  1)Hold off on starting Apixaban  for stroke prevention (blood Thinner)--- due to concerns about low Platelet count at this time. 2)Avoid ibuprofen/Advil/Aleve/Motrin/Goody Powders/Naproxen/BC powders/Meloxicam/Diclofenac/Indomethacin and other Nonsteroidal anti-inflammatory medications as these will make you more likely to bleed and can cause stomach ulcers, can also cause Kidney problems.  3)Take Cephalexin 500 mg for 5 days for E. coli urinary tract infection 4)Follow-up with your doctor for recheck of your sodium (BMP test) and CBC in 3 to 5 days  5) avoid drinking more than 1 L of water per day, ok to drink  other liquids (Limit Total  fluid intake to no more than 2 L/day, but limit free water intake to no more than 1 L/day) 6) please call if any concerns about bleeding 7) you have an irregular heartbeat also called atrial fibrillation 8) Repeat blood count/CBC in 3 to 5 days advised, if your blood counts remain low your primary care doctor will need to send you to a hematologist for further investigations including possibly a bone marrow biopsy to try to figure out why your blood counts including your platelets are low 9) ultrasound shows right sided thyroid nodule outpatient follow-up for possible thyroid biopsy advised 10) uterine fibroids--- follow-up with a primary care doctor for further monitoring and possible investigations if problems arise  Admission Diagnosis  Hyponatremia [E87.1] Thrombocytopenia (HCC) [D69.6] Atypical chest pain [R07.89] Elevated liver enzymes [R74.8] Leukopenia, unspecified type [D72.819]   Discharge Diagnosis   Hyponatremia [E87.1] Thrombocytopenia ([place]) [D69.6] Atypical chest pain [R07.89] Elevated liver enzymes [R74.8] Leukopenia, unspecified type [D72.819]    Principal Problem:   Sepsis secondary to UTI [facility]) Active Problems:   Leukopenia   Thrombocytopenia (HCC)   Elevated transaminase level   Acute hyponatremia   Hypertension   Positive D dimer   Recurrent UTI   GERD (gastroesophageal reflux disease)   Arthritis   Tick bites   AF (paroxysmal atrial fibrillation) ([place])      Past Medical History:  Diagnosis Date  . Arthritis   . Chronic hyponatremia   . GERD (gastroesophageal reflux disease)   . Hypertension   . Recurrent UTI     Past Surgical History:  Procedure Laterality Date  . HERNIA REPAIR     x2  groin  . REPLACEMENT TOTAL KNEE     right knee  . SKIN CANCER EXCISION  05/2017   Excised from calf area  . TUBAL LIGATION         HPI  from the history and physical done on the day of admission:    a 72 y.o. female with a Past Medical History of recurrent UTI; HTN; GERD; and chronic hyponatremia who presents with weakness, nausea, and decreased PO intake since last Friday.  She  incidentally had a tick that was attached behind her right posterior knee that she removed on Thursday.  She was seen by her PCP on Monday.  Her only urinary complaint was a "strong odor" and she was given Cipro - she took 1 dose and vomited it up.  She has had only 1 episode of vomiting but has had persistent and worsening nausea.  On exam, she reports feeling better but still appears quite fatigued.  Her lungs are clear, her heart is in RRR, and her abdomen is not at all TTP.  The right popliteal fossa has a small macular area that is likely where the tick was removed but no erythema.  She has a chronic ulcer on her right anterior lower leg that appears to be healing well with a clean fairy superficial base and no surrounding erythema   72 y.o. female with medical history significant for severe  GERD, recurrent UTI, hypertension, osteoarthritis and recurrent hyponatremia.  Reports that last Thursday into Friday she developed generalized malaise and by Friday night she was having chills.  She noticed increasing chest discomfort with eating and attributed this to her GERD.  She also developed nausea worse than baseline.  She was not having any fevers at home.  During this timeframe she had poor oral intake.  Last Wednesday she did eat out at a local restaurant and on Monday someone brought her to take out rotisserie chicken.  She also developed urinary discoloration, dysuria and frequency and was diagnosed with a UTI by her PCP and started on Cipro on 6/17.  In the a.m. she was able to tolerate 1 tablet by p.m. she developed vomiting and was unable to tolerate.  Because of her symptoms she presented to the ER.  Vital signs revealed low-grade tachycardia, low-grade fever 99.1, she was normotensive initially but repeat blood pressure briefly dropped to 86 so she was given 2 L of fluid.  Had been obtained and sodium was low at 120 with a baseline in October 2018 between 134 and 136.  Alkaline phosphatase was 211, AST 178, ALT 123, total bilirubin was normal.  Troponin and EKG were normal.  White count was low at 1800 but neutrophils were appropriate at 88%, absolute neutrophils were slightly decreased at 1.5% and lymphocytes were low at 0.2%.  Lites were also low at 81,000 and hemoglobin was normal at 12.3.  Because of these findings patient was initially accepted for admission regarding her acute hyponatremia.  Since patient was initially accepted she has subsequently developed fever up to 103 F.  Urinalysis was pending.  Admitting physician SIADH labs.  Serum osmolality returned slightly low at 254 but this was obtained after patient had been given fluid bolus.  Also CRP was ordered which later returned positive at 19.2, lactic acid ordered and was mildly elevated at 2.1.  D-dimer was elevated at 11.11,  coags were normal except for slightly elevated PTT at 41.  ESR was normal at 7.  At this juncture patient appears to be symptomatic with sepsis physiology likely related to urinary tract infection.  Additional history has been obtained from the patient noting she confirms a history of recurrent UTI and is followed by urologist.  She has also been exposed to multiple tick bites over the past 2 to 3 weeks without any rashes noted post tick bite.  Also the tick bite areas themselves only involve very tiny red areas without any significant induration, pain or drainage.    ED Course:  Vital Signs:  BP 131/88 (BP Location: Left Arm)   Pulse (!) 104   Temp (!) 103 F (39.4 C) (Oral)   Resp (!) 22   Ht 5' 5" (1.651 m)   Wt 67.9 kg (149 lb 12.8 oz)   SpO2 96%   BMI 24.93 kg/m  CXR: Changes possibly acute or chronic no focal consolidation, probable small bilateral pleural effusions. Lab data: 120, chloride 88, potassium 3.9, glucose 125, BUN 8, creatinine 0.71, anion gap 10, alkaline phosphatase 211, AST 178, ALT 123, total bilirubin 0.2,, troponin normal, iron 17, sat 7, ferritin 1530, folate 33.6, CRP 19.2, lactic acid 2.1, osmolality 254, B12 5813, white count 1800 with neutrophils 88%, absolute neutrophils 1.5%, hemoglobin 12.3, platelets 81,000, absolute lymphocyte 0.2% with lymphocytes 9%, 7, d-dimer 11.11, fibrinogen 310, coags normal except for mildly elevated PTT at 41, blood cultures have been obtained after arrival to unit.  Urinalysis and culture pending collection Medications and treatments: NS bolus x1 L, Reglan 10 mg IV x1, Benadryl 25 mg IV x1, Zofran 4 mg IV x1     Hospital Course:    Brief Summary:- 72 y.o.femalewith a Past Medical History of recurrent UTI; HTN; GERD; and chronic hyponatremia admitted on 10/12/2017 with sodium of 120, malaise and fatigue as well as poor appetite and nausea, also reported tick bite found to be thrombocytopenic/pancytopenic   Plan 1)E coli  sepsis and UTI-  overall much improved, no fevers, sepsis syndrome is resolved, patient was treated with iv Rocephin,  then she was switched to  p.o. Keflex per sensitivity.  Pancytopenia noted, , patient has a history of recurrent UTIs, she does follow regularly with urology,   2)HypoNatremia--she does admit to drinking up to 6 L of water per day, she drinks over 6 L of water daily and attempt to "flush her kidneys", improving, sodium is up to 127 with water restriction and iv NS, patient also has a history of severe hyponatremia previously when she was taking HCTZ.  Free water restriction advised,  no significant mental status changes at this time, continue to avoid excessive free water intake, follow-up with PCP for repeat BMP in 3 to 5 days  3)Elevated LFTs with pancytopenia----CT abdomen and pelvis without acute findings, acute viral hepatitis panel negative,  dedicated liver ultrasound without acute findings, repeat CBC with PCP in 3 to 5 days, after resolution of acute infection if pancytopenia persist PCP will refer patient to hematology for further work-up including possible bone marrow biopsy  4) 2.1 cm right-sided thyroid nodule--TSH and free T4 normal, follow-up as outpatient for possible thyroid biopsy  5)Uterine fibroid-asymptomatic follow-up as outpatient  6)Tick Bites -initially treated with IV Doxycycline for empiric coverage of tick-borne illness, however RMSF and ehrlichiosis test results are negative so we will discontinue doxycycline  7)Pancytopenia--- may be related to acute E. coli infection, patient will need outpatient hematology follow-up including possible bone marrow biopsy if pancytopenia persists despite treatment of underlying infection, may be related to hepatitis, elevated LFTs noted however viral hepatitis profile is negative, and CT abdomen and pelvis as well as liver ultrasound without significant liver abnormalities  8)Paroxysmal Atrial Fib--- patient was  noted to be in and out of atrial fibrillation during his hospital stay, please see EKG on file, potassium was low and replaced, magnesium normal, rate is controlled without any chronotropic agents, CHA2DS2-VASC score is 3.... With annual risk of stroke being around 3%,, after discussing risk versus benefits of anticoagulation with patient and his son at bedside, we will hold  off on using apixaban at this time due to new onset thrombocytopenia which may be related to underlying infection/sepsis  Discharge Condition: stable  Follow UP  Follow-up Information    Koirala, Dibas, MD. Go on 10/25/2017.   Specialty:  Family Medicine Why:  _0 :15pm Contact information: [address] [place] [address] [phone]           Diet and Activity recommendation:  As advised  Discharge Instructions    Discharge Instructions    Call MD for:  difficulty breathing, headache or visual disturbances   Complete by:  As directed    Call MD for:  persistant dizziness or light-headedness   Complete by:  As directed    Call MD for:  persistant nausea and vomiting   Complete by:  As directed    Call MD for:  severe uncontrolled pain   Complete by:  As directed    Call MD for:  temperature >100.4   Complete by:  As directed    Diet general   Complete by:  As directed    Discharge instructions   Complete by:  As directed    1)Hold off on starting Apixaban  for stroke prevention (blood Thinner)--- due to concerns about low Platelet count at this time. 2)Avoid ibuprofen/Advil/Aleve/Motrin/Goody Powders/Naproxen/BC powders/Meloxicam/Diclofenac/Indomethacin and other Nonsteroidal anti-inflammatory medications as these will make you more likely to bleed and can cause stomach ulcers, can also cause Kidney problems.  3)Take Cephalexin 500 mg for 5 days for E. coli urinary tract infection 4)Follow-up with your doctor for recheck of your sodium (BMP test) and CBC in 3 to 5 days  5) avoid  drinking more than 1 L of water per day, ok to drink  other liquids (Limit Total  fluid intake to no more than 2 L/day, but limit free water intake to no more than 1 L/day) 6) please call if any concerns about bleeding 7) you have an irregular heartbeat also called atrial fibrillation 8) Repeat blood count/CBC in 3 to 5 days advised, if your blood counts remain low your primary care doctor will need to send you to a hematologist for further investigations including possibly a bone marrow biopsy to try to figure out why your blood counts including your platelets are low 9) ultrasound shows right sided thyroid nodule outpatient follow-up for possible thyroid biopsy advised 10) uterine fibroids--- follow-up with a primary care doctor for further monitoring and possible investigations if problems arise   Increase activity slowly   Complete by:  As directed         Discharge Medications     Allergies as of 10/15/2017      Reactions   Codeine Other (See Comments)   Unknown    Hydrochlorothiazide Other (See Comments)   Unknown    Strawberry Flavor Itching      Medication List    STOP taking these medications   amLODipine 5 MG tablet Commonly known as:  NORVASC   ciprofloxacin 500 MG tablet Commonly known as:  CIPRO   losartan 50 MG tablet Commonly known as:  COZAAR     TAKE these medications   cephALEXin 500 MG capsule Commonly known as:  KEFLEX Take 1 capsule (500 mg total) by mouth 3 (three) times daily. What changed:    how much to take  how to take this  when to take this  additional instructions   cetirizine 10 MG tablet Commonly known as:  ZYRTEC Take 10 mg by mouth daily.  famotidine 20 MG tablet Commonly known as:  PEPCID Take 1 tablet (20 mg total) by mouth 2 (two) times daily.   fluticasone 50 MCG/ACT nasal spray Commonly known as:  FLONASE INSTILL 2 SPRAYS IN EACH NOSTRIL EVERY DAY.   LORazepam 0.5 MG tablet Commonly known as:  ATIVAN Take 0.5 mg  by mouth daily as needed for anxiety.   omeprazole 20 MG capsule Commonly known as:  PRILOSEC Take 2 capsules (40 mg total) by mouth 2 (two) times daily.   ondansetron 4 MG tablet Commonly known as:  ZOFRAN Take 1 tablet (4 mg total) by mouth every 8 (eight) hours as needed for nausea or vomiting.   simvastatin 20 MG tablet Commonly known as:  ZOCOR Take 20 mg by mouth daily.       Major procedures and Radiology Reports - PLEASE review detailed and final reports for all details, in brief   Dg Chest 2 View  Result Date: 10/12/2017 CLINICAL DATA:  Chest pain off and on since yesterday. Vomiting. Diagnosed with urinary tract infection at primary care physician today. EXAM: CHEST - 2 VIEW COMPARISON:  09/10/2012 FINDINGS: Shallow inspiration. Heart size and pulmonary vascularity are normal. Central interstitial changes with peribronchial thickening suggesting acute or chronic bronchitis. Blunting of the costophrenic angles suggests small pleural effusions. No focal consolidation or airspace disease. No pneumothorax. Mediastinal contours appear intact. Degenerative changes in the spine and shoulders. IMPRESSION: 1. Bronchitic changes, possibly acute or chronic. No focal consolidation. 2. Probable small bilateral pleural effusions. Electronically Signed   By: Lucienne Capers M.D.   On: 10/12/2017 02:11   Ct Angio Chest Pe W Or Wo Contrast  Addendum Date: 10/12/2017   ADDENDUM REPORT: 10/12/2017 09:06 ADDENDUM: Add to IMPRESSION: Small indeterminate left adrenal mass. This finding may warrant a follow-up study in 1 year to assess stability. If more aggressive surveillance is felt to be warranted, MR pre and post-contrast of the adrenals could be helpful for further assessment. Electronically Signed   By: Lowella Grip III M.D.   On: 10/12/2017 09:06   Result Date: 10/12/2017 CLINICAL DATA:  Chest pain.  Abdominal pain.  Nausea and vomiting. EXAM: CT ANGIOGRAPHY CHEST CT ABDOMEN AND PELVIS  WITH CONTRAST TECHNIQUE: Multidetector CT imaging of the chest was performed using the standard protocol during bolus administration of intravenous contrast. Multiplanar CT image reconstructions and MIPs were obtained to evaluate the vascular anatomy. Multidetector CT imaging of the abdomen and pelvis was performed using the standard protocol during bolus administration of intravenous contrast. CONTRAST:  170m ISOVUE-370 IOPAMIDOL (ISOVUE-370) INJECTION 76% COMPARISON:  Chest radiograph October 12, 2017; chest CT Sep 10, 2012; CT abdomen and pelvis Sep 23, 2006 FINDINGS: CTA CHEST FINDINGS Cardiovascular: There is no demonstrable pulmonary embolus. There is no thoracic aortic aneurysm or dissection. Visualized great vessels appear unremarkable. There are foci of calcification in the thoracic aorta. There is no appreciable pericardial thickening. Minimal pericardial fluid may be physiologic. Mediastinum/Nodes: There is a mass in the right lobe of the thyroid measuring 1.6 x 1.3 cm. There is a smaller mass in the left lobe of the thyroid measuring 1.1 x 0.7 cm. There are subcentimeter mediastinal lymph nodes. No adenopathy by size criteria evident in the thoracic region. No esophageal lesions are evident. Lungs/Pleura: There is bibasilar atelectatic change. There is a small right pleural effusion. There is no parenchymal lung edema or consolidation. Musculoskeletal: There is degenerative change in the thoracic spine. There are no blastic or lytic bone lesions. No chest  wall lesions are evident. Review of the MIP images confirms the above findings. CT ABDOMEN and PELVIS FINDINGS Hepatobiliary: No focal liver lesions are appreciable. Gallbladder wall is not appreciably thickened. There is no biliary duct dilatation. Pancreas: No pancreatic mass or inflammatory focus evident. Spleen: No splenic lesions are appreciable. Adrenals/Urinary Tract: Right adrenal appears normal. There is a 1.2 x 0.9 cm nodular lesion in the left  adrenal which must be regarded as indeterminate based on attenuation criteria. There is a cyst arising in the lateral mid left kidney measuring 1.6 x 1.4 cm. There is a 7 x 5 mm cyst in the anterior inferior left kidney. There is no hydronephrosis on either side. There is no renal or ureteral calculus on either side. Urinary bladder is midline with wall thickness within normal limits. Stomach/Bowel: There is no appreciable bowel wall or mesenteric thickening. There are scattered colonic diverticula without diverticulitis. No evident bowel obstruction. No free air or portal venous air. Vascular/Lymphatic: There is atherosclerotic calcification in aorta. No aneurysm. Major mesenteric vessels appear patent. No adenopathy is evident in the abdomen or pelvis. Reproductive: Uterus is anteverted. There is a mass either arising from the uterus on the right somewhat inferiorly versus an immediately adjacent right ovarian/adnexal mass. This lesion appears solid and measures 3.2 x 2.7 x 2.7 cm. There is a presumed eccentric leiomyoma arising from the uterus superiorly and anteriorly measuring 1.6 x 1.5 x 1.5 cm. There is minimal fluid in the cul-de-sac region. Other: There is no periappendiceal region abnormality. Appendix not convincingly seen. No abscess or ascites evident in the abdomen or pelvis. There is a small ventral hernia containing only fat. Musculoskeletal: There is degenerative change in the lumbar spine. There is mild lower lumbar levoscoliosis. There are no blastic or lytic bone lesions. No intramuscular or abdominal wall lesion evident. Review of the MIP images confirms the above findings. IMPRESSION: CT angiogram chest: 1. No demonstrable pulmonary embolus. No thoracic aortic aneurysm or dissection. There is aortic atherosclerosis. 2. Bibasilar atelectasis. No edema or consolidation. Small right pleural effusion. 3.  No appreciable thoracic adenopathy. 4. **An incidental finding of potential clinical  significance has been found. Dominant mass right lobe thyroid. Consider further evaluation with thyroid ultrasound. If patient is clinically hyperthyroid, consider nuclear medicine thyroid uptake and scan.** CT abdomen and pelvis: 1. Leiomyomatous uterus. Mass arising either from the right side of the uterus or from the immediately adjacent to the ovary. Advise pelvic ultrasound to further evaluate this solid mass measuring 3.2 x 2.7 x 2.7 cm, in particular to assess uterine versus right adnexal origin. 2. Scattered colonic diverticula without diverticulitis. No bowel obstruction. No abscess. No periappendiceal region inflammation. 3. No evident renal or ureteral calculus. No hydronephrosis appreciable. 4.  Aortic atherosclerosis. 5.  Small ventral hernia containing only fat. Aortic Atherosclerosis (ICD10-I70.0). Electronically Signed: By: Lowella Grip III M.D. On: 10/12/2017 09:02   Ct Abdomen Pelvis W Contrast  Addendum Date: 10/12/2017   ADDENDUM REPORT: 10/12/2017 09:06 ADDENDUM: Add to IMPRESSION: Small indeterminate left adrenal mass. This finding may warrant a follow-up study in 1 year to assess stability. If more aggressive surveillance is felt to be warranted, MR pre and post-contrast of the adrenals could be helpful for further assessment. Electronically Signed   By: Lowella Grip III M.D.   On: 10/12/2017 09:06   Result Date: 10/12/2017 CLINICAL DATA:  Chest pain.  Abdominal pain.  Nausea and vomiting. EXAM: CT ANGIOGRAPHY CHEST CT ABDOMEN AND PELVIS WITH CONTRAST TECHNIQUE: Multidetector  CT imaging of the chest was performed using the standard protocol during bolus administration of intravenous contrast. Multiplanar CT image reconstructions and MIPs were obtained to evaluate the vascular anatomy. Multidetector CT imaging of the abdomen and pelvis was performed using the standard protocol during bolus administration of intravenous contrast. CONTRAST:  1105m ISOVUE-370 IOPAMIDOL (ISOVUE-370)  INJECTION 76% COMPARISON:  Chest radiograph October 12, 2017; chest CT Sep 10, 2012; CT abdomen and pelvis Sep 23, 2006 FINDINGS: CTA CHEST FINDINGS Cardiovascular: There is no demonstrable pulmonary embolus. There is no thoracic aortic aneurysm or dissection. Visualized great vessels appear unremarkable. There are foci of calcification in the thoracic aorta. There is no appreciable pericardial thickening. Minimal pericardial fluid may be physiologic. Mediastinum/Nodes: There is a mass in the right lobe of the thyroid measuring 1.6 x 1.3 cm. There is a smaller mass in the left lobe of the thyroid measuring 1.1 x 0.7 cm. There are subcentimeter mediastinal lymph nodes. No adenopathy by size criteria evident in the thoracic region. No esophageal lesions are evident. Lungs/Pleura: There is bibasilar atelectatic change. There is a small right pleural effusion. There is no parenchymal lung edema or consolidation. Musculoskeletal: There is degenerative change in the thoracic spine. There are no blastic or lytic bone lesions. No chest wall lesions are evident. Review of the MIP images confirms the above findings. CT ABDOMEN and PELVIS FINDINGS Hepatobiliary: No focal liver lesions are appreciable. Gallbladder wall is not appreciably thickened. There is no biliary duct dilatation. Pancreas: No pancreatic mass or inflammatory focus evident. Spleen: No splenic lesions are appreciable. Adrenals/Urinary Tract: Right adrenal appears normal. There is a 1.2 x 0.9 cm nodular lesion in the left adrenal which must be regarded as indeterminate based on attenuation criteria. There is a cyst arising in the lateral mid left kidney measuring 1.6 x 1.4 cm. There is a 7 x 5 mm cyst in the anterior inferior left kidney. There is no hydronephrosis on either side. There is no renal or ureteral calculus on either side. Urinary bladder is midline with wall thickness within normal limits. Stomach/Bowel: There is no appreciable bowel wall or  mesenteric thickening. There are scattered colonic diverticula without diverticulitis. No evident bowel obstruction. No free air or portal venous air. Vascular/Lymphatic: There is atherosclerotic calcification in aorta. No aneurysm. Major mesenteric vessels appear patent. No adenopathy is evident in the abdomen or pelvis. Reproductive: Uterus is anteverted. There is a mass either arising from the uterus on the right somewhat inferiorly versus an immediately adjacent right ovarian/adnexal mass. This lesion appears solid and measures 3.2 x 2.7 x 2.7 cm. There is a presumed eccentric leiomyoma arising from the uterus superiorly and anteriorly measuring 1.6 x 1.5 x 1.5 cm. There is minimal fluid in the cul-de-sac region. Other: There is no periappendiceal region abnormality. Appendix not convincingly seen. No abscess or ascites evident in the abdomen or pelvis. There is a small ventral hernia containing only fat. Musculoskeletal: There is degenerative change in the lumbar spine. There is mild lower lumbar levoscoliosis. There are no blastic or lytic bone lesions. No intramuscular or abdominal wall lesion evident. Review of the MIP images confirms the above findings. IMPRESSION: CT angiogram chest: 1. No demonstrable pulmonary embolus. No thoracic aortic aneurysm or dissection. There is aortic atherosclerosis. 2. Bibasilar atelectasis. No edema or consolidation. Small right pleural effusion. 3.  No appreciable thoracic adenopathy. 4. **An incidental finding of potential clinical significance has been found. Dominant mass right lobe thyroid. Consider further evaluation with thyroid ultrasound. If  patient is clinically hyperthyroid, consider nuclear medicine thyroid uptake and scan.** CT abdomen and pelvis: 1. Leiomyomatous uterus. Mass arising either from the right side of the uterus or from the immediately adjacent to the ovary. Advise pelvic ultrasound to further evaluate this solid mass measuring 3.2 x 2.7 x 2.7 cm, in  particular to assess uterine versus right adnexal origin. 2. Scattered colonic diverticula without diverticulitis. No bowel obstruction. No abscess. No periappendiceal region inflammation. 3. No evident renal or ureteral calculus. No hydronephrosis appreciable. 4.  Aortic atherosclerosis. 5.  Small ventral hernia containing only fat. Aortic Atherosclerosis (ICD10-I70.0). Electronically Signed: By: Lowella Grip III M.D. On: 10/12/2017 09:02   US Liver Doppler  Result Date: 10/14/2017 CLINICAL DATA:  Abnormal liver function test. EXAM: DUPLEX ULTRASOUND OF LIVER TECHNIQUE: Color and duplex Doppler ultrasound was performed to evaluate the hepatic in-flow and out-flow vessels. COMPARISON:  None. FINDINGS: Liver: Normal parenchymal echogenicity. Normal hepatic contour without nodularity. No focal lesion, mass or intrahepatic biliary ductal dilatation. Portal Vein Velocities Main:  46 cm/sec Right:  27.9 cm/sec Left:  26.9 cm/sec Normal hepatopetal flow is noted in the portal veins. Hepatic Vein Velocities Right:  35 cm/sec Middle:  22.4 cm/sec Left:  18.6 cm/sec Normal hepatopetal flow is noted in the hepatic veins. IVC: Present and patent with normal respiratory phasicity. Hepatic Artery Velocity:  95.6 cm/sec Splenic Vein Velocity:  41.8 cm/sec Varices: Absent. Ascites: Absent. Spleen appears to be normal in size and appearance. No gallstones are noted, but mild gallbladder wall thickening is noted. 6 mm gallbladder polyp is noted as well. IMPRESSION: No evidence of hepatic, portal or splenic venous thrombosis or occlusion is noted. Mild gallbladder wall thickening is noted without cholelithiasis. If there is clinical concern for cholecystitis, HIDA scan may be performed for further evaluation. 6 mm gallbladder polyp is noted. Follow-up ultrasound in 1 year is recommended to ensure stability and rule out neoplasm. Electronically Signed   By: Marijo Conception, M.D.   On: 10/14/2017 09:48   US Thyroid  Result  Date: 10/12/2017 CLINICAL DATA:  Thyroid nodule on chest CT. EXAM: THYROID ULTRASOUND TECHNIQUE: Ultrasound examination of the thyroid gland and adjacent soft tissues was performed. COMPARISON:  Chest CT 10/12/2017 FINDINGS: Parenchymal Echotexture: Mildly heterogenous Isthmus: 0.2 cm Right lobe: 4.0 x 2.4 x 1.4 cm Left lobe: 4.5 x 2.1 x 1.2 cm _________________________________________________________ Estimated total number of nodules >/= 1 cm: 1 Number of spongiform nodules >/=  2 cm not described below (TR1): 0 Number of mixed cystic and solid nodules >/= 1.5 cm not described below ([place]): 0 _________________________________________________________ Nodule # 1: Location: Right; Inferior Maximum size: 2.1 cm; Other 2 dimensions: 2.0 x 1.4 cm Composition: solid/almost completely solid (2) Echogenicity: hypoechoic (2) Shape: not taller-than-wide (0) Margins: smooth (0) Echogenic foci: none (0) ACR TI-RADS total points: 4. ACR TI-RADS risk category: TR4 (4-6 points). ACR TI-RADS recommendations: **Given size (>/= 1.5 cm) and appearance, fine needle aspiration of this moderately suspicious nodule should be considered based on TI-RADS criteria. _________________________________________________________ Nodule # 3: Location: Left; Mid Maximum size: 0.8 cm; Other 2 dimensions: 0.7 x 0.7 cm Composition: solid/almost completely solid (2) Echogenicity: hypoechoic (2) Shape: not taller-than-wide (0) Margins: ill-defined (0) Echogenic foci: none (0) ACR TI-RADS total points: 4. ACR TI-RADS risk category: TR4 (4-6 points). ACR TI-RADS recommendations: Given size (<0.9 cm) and appearance, this nodule does NOT meet TI-RADS criteria for biopsy or dedicated follow-up. _________________________________________________________ Additional small thyroid nodules that do not meet criteria for biopsy  or dedicated follow-up. IMPRESSION: Thyroid nodules. Dominant right thyroid nodule measures up to 2.1 cm. This is a TR 4 nodule and does meet  criteria for ultrasound-guided biopsy. The above is in keeping with the ACR TI-RADS recommendations - J Am Coll Radiol 2017;14:587-595. Electronically Signed   By: Markus Daft M.D.   On: 10/12/2017 12:15   US Pelvic Complete With Transvaginal  Result Date: 10/12/2017 CLINICAL DATA:  Follow-up possible uterine mass on the right EXAM: TRANSABDOMINAL AND TRANSVAGINAL ULTRASOUND OF PELVIS TECHNIQUE: Both transabdominal and transvaginal ultrasound examinations of the pelvis were performed. Transabdominal technique was performed for global imaging of the pelvis including uterus, ovaries, adnexal regions, and pelvic cul-de-sac. It was necessary to proceed with endovaginal exam following the transabdominal exam to visualize the ovaries. COMPARISON:  CT from earlier in the same day. FINDINGS: Uterus Measurements: 7.4 x 3.6 x 4.9 cm. Multiple uterine lesions are identified. The largest of these measures 2.8 cm and corresponds to that seen on recent CT examination. Endometrium Thickness: 4 mm.  No focal abnormality visualized. Right ovary Not well visualized. Left ovary Not well visualized Other findings No abnormal free fluid. IMPRESSION: Uterine fibroids consistent with that seen on prior CT examination. No other focal abnormality is noted. Electronically Signed   By: Inez Catalina M.D.   On: 10/12/2017 10:52    Micro Results   Recent Results (from the past 240 hour(s))  Culture, blood (Routine X 2) w Reflex to ID Panel     Status: None (Preliminary result)   Collection Time: 10/12/17  7:27 AM  Result Value Ref Range Status   Specimen Description BLOOD BLOOD RIGHT HAND  Final   Special Requests   Final    BOTTLES DRAWN AEROBIC ONLY Blood Culture results may not be optimal due to an inadequate volume of blood received in culture bottles   Culture   Final    NO GROWTH 3 DAYS Performed at [facility] [address], [place], [place] [address]    Report Status PENDING  Incomplete  Culture, blood  (Routine X 2) w Reflex to ID Panel     Status: None (Preliminary result)   Collection Time: 10/12/17  7:29 AM  Result Value Ref Range Status   Specimen Description BLOOD LEFT ANTECUBITAL  Final   Special Requests   Final    BOTTLES DRAWN AEROBIC AND ANAEROBIC Blood Culture results may not be optimal due to an excessive volume of blood received in culture bottles   Culture   Final    NO GROWTH 3 DAYS Performed at [facility], [place] [address] [place], [place] [address]    Report Status PENDING  Incomplete  Culture, Urine     Status: Abnormal   Collection Time: 10/12/17  8:06 AM  Result Value Ref Range Status   Specimen Description URINE, CLEAN CATCH  Final   Special Requests   Final    NONE Performed at [facility], [place] [address] [place], [place] [address]    Culture >=100,000 COLONIES/mL ESCHERICHIA COLI (A)  Final   Report Status 10/14/2017 FINAL  Final   Organism ID, Bacteria ESCHERICHIA COLI (A)  Final      Susceptibility   Escherichia coli - MIC*    AMPICILLIN >=32 RESISTANT Resistant     CEFAZOLIN <=4 SENSITIVE Sensitive     CEFTRIAXONE <=1 SENSITIVE Sensitive     CIPROFLOXACIN >=4 RESISTANT Resistant     GENTAMICIN <=1 SENSITIVE Sensitive  IMIPENEM 0.5 SENSITIVE Sensitive     NITROFURANTOIN <=16 SENSITIVE Sensitive     TRIMETH/SULFA >=320 RESISTANT Resistant     AMPICILLIN/SULBACTAM 8 SENSITIVE Sensitive     PIP/TAZO <=4 SENSITIVE Sensitive     Extended ESBL NEGATIVE Sensitive     * >=100,000 COLONIES/mL ESCHERICHIA COLI       Today   Subjective    Erika [name] today has no new complaints, No fever  Or chills , son at bedside, questions answered          Patient has been seen and examined prior to discharge   Objective   Blood pressure 121/83, pulse 65, temperature 98 F (36.7 C), temperature source Oral, resp. rate 18, height 5' 5" (1.651 m), weight 72.3 kg (159 lb 8 oz), SpO2 98 %.   Intake/Output Summary (Last 24 hours) at 10/15/2017  1711 Last data filed at 10/15/2017 1333 Gross per 24 hour  Intake 1949.76 ml  Output 700 ml  Net 1249.76 ml    Exam  Gen:- Awake Alert,  In no apparent distress  HEENT:- [place].AT, No sclera icterus Neck-Supple Neck,No JVD,.  Lungs-  CTAB , good movement CV- S1, S2 normal, irregularly irregular, no tachycardia Abd-  +ve B.Sounds, Abd Soft, No tenderness,    Extremity/Skin:- No  edema,    good pulses  psych-affect is appropriate, oriented x3 Neuro-no new focal deficits, no tremors     Data Review   CBC w Diff:  Lab Results  Component Value Date   WBC 2.0 (L) 10/13/2017   HGB 10.0 (L) 10/13/2017   HCT 28.3 (L) 10/13/2017   PLT 73 (L) 10/13/2017   LYMPHOPCT 17 10/12/2017   MONOPCT 6 10/12/2017   EOSPCT 1 10/12/2017   BASOPCT 1 10/12/2017    CMP:  Lab Results  Component Value Date   NA 127 (L) 10/15/2017   K 3.3 (L) 10/15/2017   CL 102 10/15/2017   CO2 18 (L) 10/15/2017   BUN 10 10/15/2017   CREATININE 0.62 10/15/2017   PROT 4.4 (L) 10/14/2017   ALBUMIN 2.4 (L) 10/14/2017   BILITOT 0.9 10/14/2017   ALKPHOS 285 (H) 10/14/2017   AST 164 (H) 10/14/2017   ALT 111 (H) 10/14/2017  .   Total Discharge time is about 33 minutes  Roxan Hockey M.D on 10/15/2017 at 5:11 PM  [facility]   Office  [phone]  Voice Recognition Viviann [employer] dictation system was used to create this note, attempts have been made to correct errors. Please contact the author with questions and/or clarifications.

## 2017-10-17 LAB — CULTURE, BLOOD (ROUTINE X 2)
CULTURE: NO GROWTH
Culture: NO GROWTH

## 2017-10-18 DIAGNOSIS — I4891 Unspecified atrial fibrillation: Secondary | ICD-10-CM | POA: Diagnosis not present

## 2017-10-18 DIAGNOSIS — E279 Disorder of adrenal gland, unspecified: Secondary | ICD-10-CM | POA: Diagnosis not present

## 2017-10-18 DIAGNOSIS — E041 Nontoxic single thyroid nodule: Secondary | ICD-10-CM | POA: Diagnosis not present

## 2017-10-18 DIAGNOSIS — R5381 Other malaise: Secondary | ICD-10-CM | POA: Diagnosis not present

## 2017-10-18 DIAGNOSIS — D72819 Decreased white blood cell count, unspecified: Secondary | ICD-10-CM | POA: Diagnosis not present

## 2017-10-18 DIAGNOSIS — D696 Thrombocytopenia, unspecified: Secondary | ICD-10-CM | POA: Diagnosis not present

## 2017-10-20 DIAGNOSIS — N390 Urinary tract infection, site not specified: Secondary | ICD-10-CM | POA: Diagnosis not present

## 2017-10-20 DIAGNOSIS — I4891 Unspecified atrial fibrillation: Secondary | ICD-10-CM | POA: Diagnosis not present

## 2017-10-20 DIAGNOSIS — R5381 Other malaise: Secondary | ICD-10-CM | POA: Diagnosis not present

## 2017-10-20 DIAGNOSIS — D696 Thrombocytopenia, unspecified: Secondary | ICD-10-CM | POA: Diagnosis not present

## 2017-10-22 DIAGNOSIS — D696 Thrombocytopenia, unspecified: Secondary | ICD-10-CM | POA: Diagnosis not present

## 2017-10-22 DIAGNOSIS — I4891 Unspecified atrial fibrillation: Secondary | ICD-10-CM | POA: Diagnosis not present

## 2017-10-22 DIAGNOSIS — R5381 Other malaise: Secondary | ICD-10-CM | POA: Diagnosis not present

## 2017-10-22 DIAGNOSIS — N390 Urinary tract infection, site not specified: Secondary | ICD-10-CM | POA: Diagnosis not present

## 2017-10-25 DIAGNOSIS — D696 Thrombocytopenia, unspecified: Secondary | ICD-10-CM | POA: Diagnosis not present

## 2017-10-25 DIAGNOSIS — K219 Gastro-esophageal reflux disease without esophagitis: Secondary | ICD-10-CM | POA: Diagnosis not present

## 2017-10-25 DIAGNOSIS — R5381 Other malaise: Secondary | ICD-10-CM | POA: Diagnosis not present

## 2017-10-25 DIAGNOSIS — N390 Urinary tract infection, site not specified: Secondary | ICD-10-CM | POA: Diagnosis not present

## 2017-10-25 DIAGNOSIS — I4891 Unspecified atrial fibrillation: Secondary | ICD-10-CM | POA: Diagnosis not present

## 2017-10-25 DIAGNOSIS — R740 Nonspecific elevation of levels of transaminase and lactic acid dehydrogenase [LDH]: Secondary | ICD-10-CM | POA: Diagnosis not present

## 2017-10-27 DIAGNOSIS — N390 Urinary tract infection, site not specified: Secondary | ICD-10-CM | POA: Diagnosis not present

## 2017-10-27 DIAGNOSIS — R5381 Other malaise: Secondary | ICD-10-CM | POA: Diagnosis not present

## 2017-10-27 DIAGNOSIS — I4891 Unspecified atrial fibrillation: Secondary | ICD-10-CM | POA: Diagnosis not present

## 2017-10-27 DIAGNOSIS — D696 Thrombocytopenia, unspecified: Secondary | ICD-10-CM | POA: Diagnosis not present

## 2017-10-29 DIAGNOSIS — E041 Nontoxic single thyroid nodule: Secondary | ICD-10-CM | POA: Diagnosis not present

## 2017-10-29 DIAGNOSIS — R399 Unspecified symptoms and signs involving the genitourinary system: Secondary | ICD-10-CM | POA: Diagnosis not present

## 2017-11-08 DIAGNOSIS — D696 Thrombocytopenia, unspecified: Secondary | ICD-10-CM | POA: Diagnosis not present

## 2017-11-08 DIAGNOSIS — I4891 Unspecified atrial fibrillation: Secondary | ICD-10-CM | POA: Diagnosis not present

## 2017-11-08 DIAGNOSIS — R5381 Other malaise: Secondary | ICD-10-CM | POA: Diagnosis not present

## 2017-11-08 DIAGNOSIS — N390 Urinary tract infection, site not specified: Secondary | ICD-10-CM | POA: Diagnosis not present

## 2017-11-10 DIAGNOSIS — N390 Urinary tract infection, site not specified: Secondary | ICD-10-CM | POA: Diagnosis not present

## 2017-11-10 DIAGNOSIS — I4891 Unspecified atrial fibrillation: Secondary | ICD-10-CM | POA: Diagnosis not present

## 2017-11-10 DIAGNOSIS — R5381 Other malaise: Secondary | ICD-10-CM | POA: Diagnosis not present

## 2017-11-10 DIAGNOSIS — D696 Thrombocytopenia, unspecified: Secondary | ICD-10-CM | POA: Diagnosis not present

## 2017-11-17 DIAGNOSIS — D696 Thrombocytopenia, unspecified: Secondary | ICD-10-CM | POA: Diagnosis not present

## 2017-11-17 DIAGNOSIS — I4891 Unspecified atrial fibrillation: Secondary | ICD-10-CM | POA: Diagnosis not present

## 2017-11-17 DIAGNOSIS — N390 Urinary tract infection, site not specified: Secondary | ICD-10-CM | POA: Diagnosis not present

## 2017-11-17 DIAGNOSIS — R5381 Other malaise: Secondary | ICD-10-CM | POA: Diagnosis not present

## 2017-11-19 DIAGNOSIS — I4891 Unspecified atrial fibrillation: Secondary | ICD-10-CM | POA: Insufficient documentation

## 2017-11-29 DIAGNOSIS — L905 Scar conditions and fibrosis of skin: Secondary | ICD-10-CM | POA: Diagnosis not present

## 2017-11-29 DIAGNOSIS — Z85828 Personal history of other malignant neoplasm of skin: Secondary | ICD-10-CM | POA: Diagnosis not present

## 2017-12-13 NOTE — Progress Notes (Signed)
Cardiology Office Note    Date:  12/14/2017   ID:  Erika [name], DOB Nov 30, 1945, MRN [id number]  PCP:  [name], MD  Cardiologist: Sinclair Grooms, MD  Chief Complaint  Patient presents with  . New Patient (Initial Visit)    History of Present Illness:  Erika [name] is a 72 y.o. female is referred to [place] as a new patient for atrial fibrillation in the setting of E. coli sepsis secondary to UTI during hospitalization 10/15/2017.  She was not anticoagulated because of thrombocytopenia.  CHA2DS2-VASc equals 3.  She also had hyponatremia, leukopenia and elevated liver enzymes.  She has a history of hypertension, GERD, chronic hyponatremia but was drinking 6 L of water a day.  Sodium improved to 127 with fluid restriction.  Was also found to have a 2.1 cm right-sided thyroid nodule with normal TSH and free T4.  Multiple tick bites initially treated with IV doxycycline.  Patient saw PCP back 10/18/2017 and was still in atrial fibrillation.  She was started on metoprolol and Eliquis.  She also needs a right thyroid nodule biopsy. Patient walks 30-40 min in a pool 4 days a week and does physical therapy. Has history of acid reflux and this worsened prior to admission. Describes constant burning and tightness that lasted all day. She has not had problem with this since she's gotten back on her diet. No chest tightness with activity. Father died of CVA, mother CHF.  Has HTN, HLD, no DM and never smoked.  Patient can feel when she was in atrial fib with her heart racing with any activity.  She has had none since she has been started on the metoprolol.  She has not been scheduled for the thyroid biopsy yet.      Past Medical History:  Diagnosis Date  . A-fib ([place])   . Adrenal nodule ([place])   . Anxiety   . Arthritis   . Atrophic vaginitis   . Chronic hyponatremia   . Female climacteric state   . GERD (gastroesophageal reflux disease)   . Hypercholesteremia   . Hyperlipidemia   .  Hypertension   . Leukopenia   . Recurrent UTI   . Right thyroid nodule   . Thrombocytopenia ([place])     Past Surgical History:  Procedure Laterality Date  . ESOPHAGOGASTRODUODENOSCOPY    . HERNIA REPAIR     x2  groin  . REPLACEMENT TOTAL KNEE     right knee  . SKIN CANCER EXCISION  05/2017   Excised from calf area  . TUBAL LIGATION      Current Medications: Current Meds  Medication Sig  . apixaban (ELIQUIS) 5 MG TABS tablet Take 5 mg by mouth daily.  . cephALEXin (KEFLEX) 500 MG capsule Take 1 capsule (500 mg total) by mouth 3 (three) times daily.  . cetirizine (ZYRTEC) 10 MG tablet Take 10 mg by mouth daily.  . famotidine (PEPCID) 20 MG tablet Take 1 tablet (20 mg total) by mouth 2 (two) times daily.  . fluticasone (FLONASE) 50 MCG/ACT nasal spray INSTILL 2 SPRAYS IN EACH NOSTRIL EVERY DAY.  . Lactobacillus-Inulin ([place] PO) Take 1 tablet by mouth daily.  Marland Kitchen LORazepam (ATIVAN) 0.5 MG tablet Take 0.5 mg by mouth daily as needed for anxiety.  Marland Kitchen losartan (COZAAR) 50 MG tablet Take 50 mg by mouth daily.  . metoprolol tartrate (LOPRESSOR) 25 MG tablet Take 25 mg by mouth daily.  . Multiple Vitamins-Minerals (MULTIVITAMIN WITH MINERALS)  tablet Take 1 tablet by mouth daily.  . nitrofurantoin (MACRODANTIN) 50 MG capsule Take 50 mg by mouth 4 (four) times daily.  . simvastatin (ZOCOR) 20 MG tablet Take 20 mg by mouth daily.     Allergies:   Codeine; Hydrochlorothiazide; and Strawberry flavor   Social History   Socioeconomic History  . Marital status: Divorced    Spouse name: Not on file  . Number of children: 3  . Years of education: Not on file  . Highest education level: Not on file  Occupational History  . Occupation: Art gallery manager Rep    Employer: [employer]  Social Needs  . Financial resource strain: Not on file  . Food insecurity:    Worry: Not on file    Inability: Not on file  . Transportation needs:    Medical: Not on  file    Non-medical: Not on file  Tobacco Use  . Smoking status: Never Smoker  . Smokeless tobacco: Never Used  Substance and Sexual Activity  . Alcohol use: No  . Drug use: No  . Sexual activity: Not on file  Lifestyle  . Physical activity:    Days per week: Not on file    Minutes per session: Not on file  . Stress: Not on file  Relationships  . Social connections:    Talks on phone: Not on file    Gets together: Not on file    Attends religious service: Not on file    Active member of club or organization: Not on file    Attends meetings of clubs or organizations: Not on file    Relationship status: Not on file  Other Topics Concern  . Not on file  Social History Narrative  . Not on file     Family History:  The patient's family history includes CVA in her father; Emphysema in her brother and sister; Heart failure in her mother; Hypertension in her father and mother.   ROS:   Please see the history of present illness.    Review of Systems  Constitution: Negative.  HENT: Negative.   Eyes: Negative.   Cardiovascular: Negative.   Respiratory: Negative.   Hematologic/Lymphatic: Negative.   Musculoskeletal: Negative.  Negative for joint pain.  Gastrointestinal: Negative.   Genitourinary: Negative.   Neurological: Positive for disturbances in coordination and weakness.   All other systems reviewed and are negative.   PHYSICAL EXAM:   VS:  BP (!) 148/80   Pulse 66   Ht 5\' 5"  (1.651 m)   Wt 143 lb 12.8 oz (65.2 kg)   SpO2 99%   BMI 23.93 kg/m   Physical Exam  GEN: Well nourished, well developed, in no acute distress  Neck: no JVD, carotid bruits, or masses Cardiac:RRR; no murmurs, rubs, or gallops  Respiratory:  clear to auscultation bilaterally, normal work of breathing GI: soft, nontender, nondistended, + BS Ext: without cyanosis, clubbing, or edema, Good distal pulses bilaterally Neuro:  Alert and Oriented x 3 Psych: euthymic mood, full affect  Wt Readings  from Last 3 Encounters:  12/14/17 143 lb 12.8 oz (65.2 kg)  10/15/17 159 lb 8 oz (72.3 kg)  01/24/13 145 lb 9.6 oz (66 kg)      Studies/Labs Reviewed:   EKG:  EKG is ordered today.  The ekg ordered today demonstrates sinus bradycardia at 59 bpm with T wave inversion anteriorly and poor R wave progression anteriorly.  These EKG changes date back to 2014  Recent Labs:  10/12/2017: TSH 0.369 10/13/2017: Hemoglobin 10.0; Platelets 73 10/14/2017: ALT 111 10/15/2017: BUN 10; Creatinine, Ser 0.62; Magnesium 1.8; Potassium 3.3; Sodium 127   Lipid Panel No results found for: CHOL, TRIG, HDL, CHOLHDL, VLDL, LDLCALC, LDLDIRECT  Additional studies/ records that were reviewed today include:  CT of chest and abdomenCT angiogram chest:   1. No demonstrable pulmonary embolus. No thoracic aortic aneurysm or dissection. There is aortic atherosclerosis.   2. Bibasilar atelectasis. No edema or consolidation. Small right pleural effusion.   3.  No appreciable thoracic adenopathy.   4. **An incidental finding of potential clinical significance has been found. Dominant mass right lobe thyroid. Consider further evaluation with thyroid ultrasound. If patient is clinically hyperthyroid, consider nuclear medicine thyroid uptake and scan.**   CT abdomen and pelvis:   1. Leiomyomatous uterus. Mass arising either from the right side of the uterus or from the immediately adjacent to the ovary. Advise pelvic ultrasound to further evaluate this solid mass measuring 3.2 x 2.7 x 2.7 cm, in particular to assess uterine versus right adnexal origin.   2. Scattered colonic diverticula without diverticulitis. No bowel obstruction. No abscess. No periappendiceal region inflammation.   3. No evident renal or ureteral calculus. No hydronephrosis appreciable.   4.  Aortic atherosclerosis.   5.  Small ventral hernia containing only fat.   Aortic Atherosclerosis (ICD10-I70.0).   Electronically Signed: By:  Lowella Grip III M.D. On: 10/12/2017 09:02 IMPRESSION: Small indeterminate left adrenal mass. This finding may warrant a follow-up study in 1 year to assess stability. If more aggressive surveillance is felt to be warranted, MR pre and post-contrast of the adrenals could be helpful for further assessment.     Electronically Signed   By: Lowella Grip III M.D.   On: 10/12/2017 09:06       ASSESSMENT:    1. AF (paroxysmal atrial fibrillation) ([place])   2. Essential hypertension   3. Coronary artery calcification   4. Abnormal EKG   5. Mixed hyperlipidemia   6. Thyroid nodule      PLAN:  In order of problems listed above:  Atrial fibrillation in the setting of UTI sepsis.  CHA2DS2-VASc equals 3.  No anticoagulation secondary to thrombocytopenia in the hospital but was still in atrial fibrillation 4 days later and started on Eliquis and metoprolol by PCP.  Patient has had no further symptoms of atrial fibrillation since then and is in normal sinus rhythm today.  Discussed with Dr. Tamala Julian who agrees she needs a 2D echo to assess LA size and for structural heart disease.  If her echo is normal he does not feel we need to keep her to lifelong Eliquis and could change her to aspirin.  If her echo is abnormal we will place a 30-day monitor.  Essential hypertension blood pressure controlled with losartan and metoprolol  Abnormal EKG with anterior T wave inversion and poor R wave progression.  This dates back to 2014.  She did have some calcification on CT scan in the hospital.  She also had some chest tightness in the setting of reflux that improved with changing her diet.  Will order CTA with FFR to rule out underlying coronary disease.  Follow-up with myself or Dr. Tamala Julian after stress testing done.  Hyperlipidemia on Zocor managed by PCP  Thyroid nodule needs biopsy  Medication Adjustments/Labs and Tests Ordered: Current medicines are reviewed at length with the patient today.   Concerns regarding medicines are outlined above.  Medication changes, Labs and  Tests ordered today are listed in the Patient Instructions below. Patient Instructions  Medication Instructions:  Your physician recommends that you continue on your current medications as directed. Please refer to the Current Medication list given to you today.   Labwork: TODAY: BMET, CBC  Testing/Procedures: Your physician has requested that you have an echocardiogram. Echocardiography is a painless test that uses sound waves to create images of your heart. It provides your doctor with information about the size and shape of your heart and how well your heart's chambers and valves are working. This procedure takes approximately one hour. There are no restrictions for this procedure.  CORONARY CT-A TO BE DONE AT [facility]   Follow-Up: Your physician wants you to follow-up in: with Dr. Tamala Julian or Reece Leader, NP after testing is complete.  Any Other Special Instructions Will Be Listed Below (If Applicable).  Please arrive at the [facility] main entrance of [facility] at _______ AM (30-45 minutes prior to test start time)  [facility] [address] [place], [place] [address] [phone]  Proceed to the [facility] Radiology Department (First Floor).  Please follow these instructions carefully (unless otherwise directed):  Hold all erectile dysfunction medications at least 48 hours prior to test.  On the Night Before the Test: . Drink plenty of water. . Do not consume any caffeinated/decaffeinated beverages or chocolate 12 hours prior to your test. . Do not take any antihistamines 12 hours prior to your test. . If the patient has contrast allergy: ? Patient will need a prescription for Prednisone and very clear instructions (as follows): 1. Prednisone 50 mg - take 13 hours prior to test 2. Take another Prednisone 50 mg 7 hours prior to test 3. Take another  Prednisone 50 mg 1 hour prior to test 4. Take Benadryl 50 mg 1 hour prior to test . Patient must complete all four doses of above prophylactic medications. . Patient will need a ride after test due to Benadryl.  On the Day of the Test: . Drink plenty of water. Do not drink any water within one hour of the test. . Do not eat any food 4 hours prior to the test. . You may take your regular medications prior to the test. . IF NOT ON A BETA BLOCKER - Take 25 mg of lopressor (metoprolol) one hour before the test.   After the Test: . Drink plenty of water. . After receiving IV contrast, you may experience a mild flushed feeling. This is normal. . On occasion, you may experience a mild rash up to 24 hours after the test. This is not dangerous. If this occurs, you can take Benadryl 25 mg and increase your fluid intake. . If you experience trouble breathing, this can be serious. If it is severe call 911 IMMEDIATELY. If it is mild, please call our office. . If you take any of these medications: Glipizide/Metformin, Avandament, Glucavance, please do not take 48 hours after completing test.    If you need a refill on your cardiac medications before your next appointment, please call your pharmacy.   Echocardiogram An echocardiogram, or echocardiography, uses sound waves (ultrasound) to produce an image of your heart. The echocardiogram is simple, painless, obtained within a short period of time, and offers valuable information to your health care provider. The images from an echocardiogram can provide information such as:  Evidence of coronary artery disease (CAD).  Heart size.  Heart muscle function.  Heart valve function.  Aneurysm detection.  Evidence of a past heart attack.  Fluid buildup around the heart.  Heart muscle thickening.  Assess heart valve function.  Tell a health care provider about:  Any allergies you have.  All medicines you are taking, including vitamins,  herbs, eye drops, creams, and over-the-counter medicines.  Any problems you or family members have had with anesthetic medicines.  Any blood disorders you have.  Any surgeries you have had.  Any medical conditions you have.  Whether you are pregnant or may be pregnant. What happens before the procedure? No special preparation is needed. Eat and drink normally. What happens during the procedure?  In order to produce an image of your heart, gel will be applied to your chest and a wand-like tool (transducer) will be moved over your chest. The gel will help transmit the sound waves from the transducer. The sound waves will harmlessly bounce off your heart to allow the heart images to be captured in real-time motion. These images will then be recorded.  You may need an IV to receive a medicine that improves the quality of the pictures. What happens after the procedure? You may return to your normal schedule including diet, activities, and medicines, unless your health care provider tells you otherwise. This information is not intended to replace advice given to you by your health care provider. Make sure you discuss any questions you have with your health care provider. Document Released: 04/10/2000 Document Revised: 11/30/2015 Document Reviewed: 12/19/2012 Elsevier Interactive Patient Education  2017 [place], Erika [name], [place]  12/14/2017 10:14 AM    [facility] [place], [place], [place]  [address] Phone: [phone]; Fax: [phone]

## 2017-12-14 ENCOUNTER — Ambulatory Visit (INDEPENDENT_AMBULATORY_CARE_PROVIDER_SITE_OTHER): Payer: Medicare Other | Admitting: Physician Assistant

## 2017-12-14 ENCOUNTER — Encounter: Payer: Self-pay | Admitting: Physician Assistant

## 2017-12-14 VITALS — BP 148/80 | HR 66 | Ht 65.0 in | Wt 143.8 lb

## 2017-12-14 DIAGNOSIS — I2584 Coronary atherosclerosis due to calcified coronary lesion: Secondary | ICD-10-CM | POA: Diagnosis not present

## 2017-12-14 DIAGNOSIS — I10 Essential (primary) hypertension: Secondary | ICD-10-CM

## 2017-12-14 DIAGNOSIS — I480 Paroxysmal atrial fibrillation: Secondary | ICD-10-CM

## 2017-12-14 DIAGNOSIS — R9431 Abnormal electrocardiogram [ECG] [EKG]: Secondary | ICD-10-CM

## 2017-12-14 DIAGNOSIS — E041 Nontoxic single thyroid nodule: Secondary | ICD-10-CM | POA: Diagnosis not present

## 2017-12-14 DIAGNOSIS — E782 Mixed hyperlipidemia: Secondary | ICD-10-CM | POA: Diagnosis not present

## 2017-12-14 DIAGNOSIS — I2510 Atherosclerotic heart disease of native coronary artery without angina pectoris: Secondary | ICD-10-CM | POA: Diagnosis not present

## 2017-12-14 DIAGNOSIS — E785 Hyperlipidemia, unspecified: Secondary | ICD-10-CM | POA: Insufficient documentation

## 2017-12-14 LAB — BASIC METABOLIC PANEL
BUN/Creatinine Ratio: 18 (ref 12–28)
BUN: 13 mg/dL (ref 8–27)
CALCIUM: 9.5 mg/dL (ref 8.7–10.3)
CHLORIDE: 97 mmol/L (ref 96–106)
CO2: 23 mmol/L (ref 20–29)
Creatinine, Ser: 0.72 mg/dL (ref 0.57–1.00)
GFR, EST AFRICAN AMERICAN: 95 mL/min/{1.73_m2} (ref >59)
GFR, EST NON AFRICAN AMERICAN: 83 mL/min/{1.73_m2} (ref >59)
Glucose: 81 mg/dL (ref 65–99)
Potassium: 4.5 mmol/L (ref 3.5–5.2)
Sodium: 134 mmol/L (ref 134–144)

## 2017-12-14 LAB — CBC
HEMATOCRIT: 38.2 % (ref 34.0–46.6)
Hemoglobin: 13.2 g/dL (ref 11.1–15.9)
MCH: 30.2 pg (ref 26.6–33.0)
MCHC: 34.6 g/dL (ref 31.5–35.7)
MCV: 87 fL (ref 79–97)
Platelets: 313 10*3/uL (ref 150–450)
RBC: 4.37 x10E6/uL (ref 3.77–5.28)
RDW: 14 % (ref 12.3–15.4)
WBC: 5.9 10*3/uL (ref 3.4–10.8)

## 2017-12-14 NOTE — Patient Instructions (Addendum)
Medication Instructions:  Your physician recommends that you continue on your current medications as directed. Please refer to the Current Medication list given to you today.   Labwork: TODAY: BMET, CBC  Testing/Procedures: Your physician has requested that you have an echocardiogram. Echocardiography is a painless test that uses sound waves to create images of your heart. It provides your doctor with information about the size and shape of your heart and how well your heart's chambers and valves are working. This procedure takes approximately one hour. There are no restrictions for this procedure.  CORONARY CT-A TO BE DONE AT [facility]   Follow-Up: Your physician wants you to follow-up in: with Dr. Tamala Julian or Reece Leader, NP after testing is complete.  Any Other Special Instructions Will Be Listed Below (If Applicable).  Please arrive at the [facility] main entrance of [facility] at _______ AM (30-45 minutes prior to test start time)  [facility] [address] [place], [place] [address] [phone]  Proceed to the [facility] Radiology Department (First Floor).  Please follow these instructions carefully (unless otherwise directed):  Hold all erectile dysfunction medications at least 48 hours prior to test.  On the Night Before the Test: . Drink plenty of water. . Do not consume any caffeinated/decaffeinated beverages or chocolate 12 hours prior to your test. . Do not take any antihistamines 12 hours prior to your test. . If the patient has contrast allergy: ? Patient will need a prescription for Prednisone and very clear instructions (as follows): 1. Prednisone 50 mg - take 13 hours prior to test 2. Take another Prednisone 50 mg 7 hours prior to test 3. Take another Prednisone 50 mg 1 hour prior to test 4. Take Benadryl 50 mg 1 hour prior to test . Patient must complete all four doses of above prophylactic medications. . Patient will need  a ride after test due to Benadryl.  On the Day of the Test: . Drink plenty of water. Do not drink any water within one hour of the test. . Do not eat any food 4 hours prior to the test. . You may take your regular medications prior to the test. . IF NOT ON A BETA BLOCKER - Take 25 mg of lopressor (metoprolol) one hour before the test.   After the Test: . Drink plenty of water. . After receiving IV contrast, you may experience a mild flushed feeling. This is normal. . On occasion, you may experience a mild rash up to 24 hours after the test. This is not dangerous. If this occurs, you can take Benadryl 25 mg and increase your fluid intake. . If you experience trouble breathing, this can be serious. If it is severe call 911 IMMEDIATELY. If it is mild, please call our office. . If you take any of these medications: Glipizide/Metformin, Avandament, Glucavance, please do not take 48 hours after completing test.    If you need a refill on your cardiac medications before your next appointment, please call your pharmacy.   Echocardiogram An echocardiogram, or echocardiography, uses sound waves (ultrasound) to produce an image of your heart. The echocardiogram is simple, painless, obtained within a short period of time, and offers valuable information to your health care provider. The images from an echocardiogram can provide information such as:  Evidence of coronary artery disease (CAD).  Heart size.  Heart muscle function.  Heart valve function.  Aneurysm detection.  Evidence of a past heart attack.  Fluid buildup  around the heart.  Heart muscle thickening.  Assess heart valve function.  Tell a health care provider about:  Any allergies you have.  All medicines you are taking, including vitamins, herbs, eye drops, creams, and over-the-counter medicines.  Any problems you or family members have had with anesthetic medicines.  Any blood disorders you have.  Any surgeries  you have had.  Any medical conditions you have.  Whether you are pregnant or may be pregnant. What happens before the procedure? No special preparation is needed. Eat and drink normally. What happens during the procedure?  In order to produce an image of your heart, gel will be applied to your chest and a wand-like tool (transducer) will be moved over your chest. The gel will help transmit the sound waves from the transducer. The sound waves will harmlessly bounce off your heart to allow the heart images to be captured in real-time motion. These images will then be recorded.  You may need an IV to receive a medicine that improves the quality of the pictures. What happens after the procedure? You may return to your normal schedule including diet, activities, and medicines, unless your health care provider tells you otherwise. This information is not intended to replace advice given to you by your health care provider. Make sure you discuss any questions you have with your health care provider. Document Released: 04/10/2000 Document Revised: 11/30/2015 Document Reviewed: 12/19/2012 Elsevier Interactive Patient Education  2017 [employer].

## 2017-12-20 DIAGNOSIS — K1329 Other disturbances of oral epithelium, including tongue: Secondary | ICD-10-CM | POA: Diagnosis not present

## 2017-12-21 ENCOUNTER — Ambulatory Visit (HOSPITAL_COMMUNITY): Payer: Medicare Other | Attending: Cardiovascular Disease

## 2017-12-21 ENCOUNTER — Other Ambulatory Visit: Payer: Self-pay

## 2017-12-21 DIAGNOSIS — I10 Essential (primary) hypertension: Secondary | ICD-10-CM | POA: Diagnosis not present

## 2017-12-21 DIAGNOSIS — E785 Hyperlipidemia, unspecified: Secondary | ICD-10-CM | POA: Diagnosis not present

## 2017-12-21 DIAGNOSIS — I480 Paroxysmal atrial fibrillation: Secondary | ICD-10-CM

## 2017-12-21 DIAGNOSIS — I340 Nonrheumatic mitral (valve) insufficiency: Secondary | ICD-10-CM | POA: Diagnosis not present

## 2017-12-22 ENCOUNTER — Telehealth: Payer: Self-pay | Admitting: Interventional Cardiology

## 2017-12-22 DIAGNOSIS — I4891 Unspecified atrial fibrillation: Secondary | ICD-10-CM

## 2017-12-22 NOTE — Telephone Encounter (Signed)
New Message:      [facility] Pre-operative Risk Assessment    Request for surgical clearance:  1. What type of surgery is being performed? Biopsy of the tongue  2. When is this surgery scheduled? TBD  What type of clearance is required (medical clearance vs. Pharmacy clearance to hold med vs. Both)? Medical   3. Are there any medications that need to be held prior to surgery and how long? Eliquis   4. Practice name and name of physician performing surgery? [facility]   5. What is your office phone number [phone]   7.   What is your office fax number [phone]  8.   Anesthesia type (None, local, MAC, general) ? Local   Charmain M Stokes 12/22/2017, 12:36 PM  _________________________________________________________________   (provider comments below)

## 2017-12-23 NOTE — Telephone Encounter (Signed)
-----   Message from Imogene Burn, PA-C sent at 12/21/2017  2:48 PM EDT ----- 2D echo shows normal heart function but heart has trouble relaxing, mild murmur, the left atrium is mildly to moderately dilated which may mean you have been in and out of atrial fibrillation for a while.  Recommend 30-day monitor to rule out atrial fibrillation

## 2017-12-23 NOTE — Telephone Encounter (Signed)
Patient recently seen by Estella Husk PA-C for an atrial fibrillation, case discussed with Dr. Tamala Julian.   Dr. Tamala Julian, please review, recent echo showed EF, Coronary CT with FFR ordered for abnormality EKG. Procedure is tongue biopsy with local anesthesia. Dr. Tamala Julian, do you recommend wait for the result of coronary CT with FFR or are you ok with clearing her medically given low risk nature of her procedure? Note, coronary CT with FFR is currently not scheduled, given the recent delay due to the volume of coronary CT, likely delay will be around 1 month before coronary CT is done.   Thank you. Please forward your response to P CV DIV PREOP  Will also forward to clinical pharmacist for review regarding the eliquis.   Hilbert Corrigan PA Pager: [phone]

## 2017-12-23 NOTE — Telephone Encounter (Signed)
Pt takes Eliquis for afib with CHADS2VASc score of 3 (age, sex, HTN). Renal function is normal. Ok to hold Eliquis for 2 days prior to biopsy.

## 2017-12-23 NOTE — Telephone Encounter (Signed)
Agree okay to hold Eliquis and clear for low risk surgical procedure. No need to wait for CT.

## 2017-12-24 NOTE — Telephone Encounter (Signed)
Call back staff: Marland Kitchen Clearance letter has been faxed to the requesting surgeon. . Please contact the surgeon's office to ensure it has been received. . This phone note will be removed from the preop pool. . Please sign encounter when completed.  Richardson Dopp, PA-C    12/24/2017 9:51 AM

## 2017-12-24 NOTE — Telephone Encounter (Signed)
Clearance letter has been received

## 2018-01-04 DIAGNOSIS — H6122 Impacted cerumen, left ear: Secondary | ICD-10-CM | POA: Diagnosis not present

## 2018-01-05 DIAGNOSIS — K1329 Other disturbances of oral epithelium, including tongue: Secondary | ICD-10-CM | POA: Diagnosis not present

## 2018-01-07 DIAGNOSIS — E279 Disorder of adrenal gland, unspecified: Secondary | ICD-10-CM | POA: Diagnosis not present

## 2018-01-07 DIAGNOSIS — D696 Thrombocytopenia, unspecified: Secondary | ICD-10-CM | POA: Diagnosis not present

## 2018-01-07 DIAGNOSIS — I10 Essential (primary) hypertension: Secondary | ICD-10-CM | POA: Diagnosis not present

## 2018-01-07 DIAGNOSIS — E041 Nontoxic single thyroid nodule: Secondary | ICD-10-CM | POA: Diagnosis not present

## 2018-01-07 DIAGNOSIS — Z0001 Encounter for general adult medical examination with abnormal findings: Secondary | ICD-10-CM | POA: Diagnosis not present

## 2018-01-07 DIAGNOSIS — I4891 Unspecified atrial fibrillation: Secondary | ICD-10-CM | POA: Diagnosis not present

## 2018-01-07 DIAGNOSIS — E7800 Pure hypercholesterolemia, unspecified: Secondary | ICD-10-CM | POA: Diagnosis not present

## 2018-01-07 DIAGNOSIS — Z79899 Other long term (current) drug therapy: Secondary | ICD-10-CM | POA: Diagnosis not present

## 2018-01-10 ENCOUNTER — Other Ambulatory Visit: Payer: Self-pay | Admitting: Family Medicine

## 2018-01-10 ENCOUNTER — Other Ambulatory Visit: Payer: Self-pay | Admitting: Physician Assistant

## 2018-01-10 DIAGNOSIS — E041 Nontoxic single thyroid nodule: Secondary | ICD-10-CM

## 2018-01-10 DIAGNOSIS — K1329 Other disturbances of oral epithelium, including tongue: Secondary | ICD-10-CM | POA: Diagnosis not present

## 2018-01-11 ENCOUNTER — Telehealth: Payer: Self-pay

## 2018-01-11 NOTE — Telephone Encounter (Signed)
   [facility] Pre-operative Risk Assessment    Request for surgical clearance:  1. What type of surgery is being performed?  Thyroid Biopsy   2. When is this surgery scheduled?  TBD   3. What type of clearance is required (medical clearance vs. Pharmacy clearance to hold med vs. Both)? Both  4. Are there any medications that need to be held prior to surgery and how long? Eliquis 48 hours   5. Practice name and name of physician performing surgery?  [facility]   6. What is your office phone number [phone]    7.   What is your office fax number [phone]  8.   Anesthesia type (None, local, MAC, general) ?    Erika [name]  Erika [name] 01/11/2018, 11:12 AM  _________________________________________________________________   (provider comments below)

## 2018-01-11 NOTE — Telephone Encounter (Signed)
Routing to pharmacy for recs regarding Eliquis

## 2018-01-11 NOTE — Telephone Encounter (Signed)
Patient with diagnosis of atrial fibrillation on Eliquis for anticoagulation.    Procedure: thyroid biopsy Date of procedure: TBD  CHADS2-VASc score of  3 (CHF, HTN, AGE, DM2, stroke/tia x 2, CAD, AGE, female)  CrCl 70.6 Platelet count 313  Per office protocol, patient can hold Eliquis for 2 days prior to procedure.    Patient should restart Eliquis on the evening of procedure or day after, at discretion of procedure MD

## 2018-01-12 ENCOUNTER — Ambulatory Visit (INDEPENDENT_AMBULATORY_CARE_PROVIDER_SITE_OTHER): Payer: Medicare Other

## 2018-01-12 DIAGNOSIS — I4891 Unspecified atrial fibrillation: Secondary | ICD-10-CM

## 2018-01-12 DIAGNOSIS — I480 Paroxysmal atrial fibrillation: Secondary | ICD-10-CM | POA: Diagnosis not present

## 2018-01-12 NOTE — Telephone Encounter (Signed)
   Primary Cardiologist: Sinclair Grooms, MD  Chart reviewed as part of pre-operative protocol coverage. Given past medical history and time since last visit, based on ACC/AHA guidelines, Erika [name] would be at acceptable risk for the planned procedure without further cardiovascular testing.   Per office protocol, patient can hold Eliquis for 2 days prior to procedure.    Patient should restart Eliquis on the evening of procedure or day after, at discretion of procedure MD  I will route this recommendation to the requesting party via [facility] fax function and remove from pre-op pool.  Please call with questions.  Lyda Jester, PA-C 01/12/2018, 9:33 AM

## 2018-01-20 ENCOUNTER — Other Ambulatory Visit (HOSPITAL_COMMUNITY)
Admission: RE | Admit: 2018-01-20 | Discharge: 2018-01-20 | Disposition: A | Discharge status: Home / Self Care | Payer: Medicare Other | Source: Ambulatory Visit | Attending: Physician Assistant | Admitting: Physician Assistant

## 2018-01-20 ENCOUNTER — Ambulatory Visit
Admission: RE | Admit: 2018-01-20 | Discharge: 2018-01-20 | Disposition: A | Discharge status: Home / Self Care | Payer: Medicare Other | Source: Ambulatory Visit | Attending: Family Medicine | Admitting: Family Medicine

## 2018-01-20 DIAGNOSIS — E041 Nontoxic single thyroid nodule: Secondary | ICD-10-CM | POA: Insufficient documentation

## 2018-01-27 ENCOUNTER — Ambulatory Visit (HOSPITAL_COMMUNITY)
Admission: RE | Admit: 2018-01-27 | Discharge: 2018-01-27 | Disposition: A | Discharge status: Home / Self Care | Payer: Medicare Other | Source: Ambulatory Visit | Attending: Physician Assistant | Admitting: Physician Assistant

## 2018-01-27 ENCOUNTER — Ambulatory Visit (HOSPITAL_COMMUNITY): Payer: Medicare Other

## 2018-01-27 DIAGNOSIS — I2584 Coronary atherosclerosis due to calcified coronary lesion: Secondary | ICD-10-CM | POA: Insufficient documentation

## 2018-01-27 DIAGNOSIS — I2510 Atherosclerotic heart disease of native coronary artery without angina pectoris: Secondary | ICD-10-CM | POA: Insufficient documentation

## 2018-01-27 DIAGNOSIS — I480 Paroxysmal atrial fibrillation: Secondary | ICD-10-CM | POA: Diagnosis not present

## 2018-01-27 MED ORDER — NITROGLYCERIN 0.4 MG SL SUBL
0.8000 mg | SUBLINGUAL_TABLET | Freq: Once | SUBLINGUAL | Status: AC
  Administered 2018-01-27: 0.8 mg via SUBLINGUAL
  Filled 2018-01-27: qty 25

## 2018-01-27 MED ORDER — NITROGLYCERIN 0.4 MG SL SUBL
SUBLINGUAL_TABLET | SUBLINGUAL | Status: AC
  Filled 2018-01-27: qty 1

## 2018-01-27 MED ORDER — IOPAMIDOL (ISOVUE-370) INJECTION 76%
100.0000 mL | Freq: Once | INTRAVENOUS | Status: AC | PRN
  Administered 2018-01-27: 100 mL via INTRAVENOUS

## 2018-02-07 ENCOUNTER — Telehealth: Payer: Self-pay | Admitting: Interventional Cardiology

## 2018-02-07 NOTE — Progress Notes (Signed)
Cardiology Office Note    Date:  02/08/2018   ID:  Erika [name], DOB 16-Mar-1946, MRN [id number]  PCP:  [name], MD  Cardiologist: Sinclair Grooms, MD EPS: None  No chief complaint on file.   History of Present Illness:  Erika [name] is a 72 y.o. female who I saw as a new patient 12/14/2017 for atrial fibrillation in the setting of E. coli sepsis secondary to UTI during hospitalization 10/15/2017. She was not anticoagulated because of thrombocytopenia. CHA2DS2-VASc equals 3. She also had hyponatremia, leukopenia and elevated liver enzymes. She has a history of hypertension, GERD, chronic hyponatremia but was drinking 6 L of water a day. Sodium improved to 127 with fluid restriction. Was also found to have a 2.1 cm right-sided thyroid nodule with normal TSH and free T4. Multiple tick bites initially treated with IV doxycycline.  Also has hypertension, HLD.  She was complaining of constant chest burning and tightness that lasted all day long but was improved with changing her diet.    I discussed with Dr. Tamala Julian who recommended 2D echo and if its normal he did not feel we need to keep her on lifelong Eliquis and could change her to aspirin.  If echo is abnormal we did place a 30-day monitor.  She also had an abnormal EKG with anterior T wave inversion and poor R wave progression.  It dates back to 2014.  She had calcification on CT in the hospital.  We ordered a CTA with FFR to rule out underlying coronary disease.  This showed normal coronary arteries with calcium score of 0.  2D echo showed normal LV function with grade 2 DD and mild to moderately dilated left atrium.  Patient comes in for follow-up and to review test results.  She is still wearing her monitor but the first 12 days were pulled and she has sinus bradycardia in the 40s and 50s but no evidence of atrial fibrillation.  She denies any cardiac complaints.  Blood pressure is up a little.  She does not take her blood pressure at  home and is liberal with her salt because she has had low sodium levels over the years.     Past Medical History:  Diagnosis Date  . A-fib ([place])   . Adrenal nodule ([place])   . Anxiety   . Arthritis   . Atrophic vaginitis   . Chronic hyponatremia   . Female climacteric state   . GERD (gastroesophageal reflux disease)   . Hypercholesteremia   . Hyperlipidemia   . Hypertension   . Leukopenia   . Recurrent UTI   . Right thyroid nodule   . Thrombocytopenia ([place])     Past Surgical History:  Procedure Laterality Date  . ESOPHAGOGASTRODUODENOSCOPY    . HERNIA REPAIR     x2  groin  . REPLACEMENT TOTAL KNEE     right knee  . SKIN CANCER EXCISION  05/2017   Excised from calf area  . TUBAL LIGATION      Current Medications: Current Meds  Medication Sig  . apixaban (ELIQUIS) 5 MG TABS tablet Take 5 mg by mouth daily.  . cephALEXin (KEFLEX) 500 MG capsule Take 500 mg by mouth 2 (two) times daily.  . cetirizine (ZYRTEC) 10 MG tablet Take 10 mg by mouth daily.  Marland Kitchen esomeprazole (NEXIUM) 40 MG capsule Take 40 mg by mouth daily.  . famotidine (PEPCID) 20 MG tablet Take 1 tablet (20 mg total) by mouth 2 (two)  times daily.  . fluticasone (FLONASE) 50 MCG/ACT nasal spray INSTILL 2 SPRAYS IN EACH NOSTRIL EVERY DAY.  . Lactobacillus-Inulin ([place] PO) Take 1 tablet by mouth daily.  Marland Kitchen LORazepam (ATIVAN) 0.5 MG tablet Take 0.5 mg by mouth daily as needed for anxiety.  Marland Kitchen losartan (COZAAR) 50 MG tablet Take 50 mg by mouth daily.  . metoprolol tartrate (LOPRESSOR) 25 MG tablet Take 25 mg by mouth daily.  . Multiple Vitamins-Minerals (MULTIVITAMIN WITH MINERALS) tablet Take 1 tablet by mouth daily.  . nitrofurantoin (MACRODANTIN) 50 MG capsule Take 50 mg by mouth daily.  . simvastatin (ZOCOR) 20 MG tablet Take 20 mg by mouth daily.     Allergies:   Codeine; Hydrochlorothiazide; and Strawberry flavor   Social History   Socioeconomic History  . Marital status: Divorced     Spouse name: Not on file  . Number of children: 3  . Years of education: Not on file  . Highest education level: Not on file  Occupational History  . Occupation: Art gallery manager Rep    Employer: [employer]  Social Needs  . Financial resource strain: Not on file  . Food insecurity:    Worry: Not on file    Inability: Not on file  . Transportation needs:    Medical: Not on file    Non-medical: Not on file  Tobacco Use  . Smoking status: Never Smoker  . Smokeless tobacco: Never Used  Substance and Sexual Activity  . Alcohol use: No  . Drug use: No  . Sexual activity: Not on file  Lifestyle  . Physical activity:    Days per week: Not on file    Minutes per session: Not on file  . Stress: Not on file  Relationships  . Social connections:    Talks on phone: Not on file    Gets together: Not on file    Attends religious service: Not on file    Active member of club or organization: Not on file    Attends meetings of clubs or organizations: Not on file    Relationship status: Not on file  Other Topics Concern  . Not on file  Social History Narrative  . Not on file     Family History:  The patient's family history includes CVA in her father; Emphysema in her brother and sister; Heart failure in her mother; Hypertension in her father and mother.   ROS:   Please see the history of present illness.    Review of Systems  Constitution: Negative.  HENT: Negative.   Eyes: Negative.   Cardiovascular: Negative.   Respiratory: Negative.   Hematologic/Lymphatic: Negative.   Musculoskeletal: Negative.  Negative for joint pain.  Gastrointestinal: Negative.   Genitourinary: Negative.   Neurological: Negative.    All other systems reviewed and are negative.   PHYSICAL EXAM:   VS:  BP (!) 148/82   Pulse (!) 58   Ht 5\' 5"  (1.651 m)   Wt 148 lb 12.8 oz (67.5 kg)   SpO2 92%   BMI 24.76 kg/m   Physical Exam  GEN: Well nourished, well developed, in no acute  distress  Neck: no JVD, carotid bruits, or masses Cardiac:RRR; no murmurs, rubs, or gallops  Respiratory:  clear to auscultation bilaterally, normal work of breathing GI: soft, nontender, nondistended, + BS Ext: without cyanosis, clubbing, or edema, Good distal pulses bilaterally Neuro:  Alert and Oriented x 3 Psych: euthymic mood, full affect  Wt Readings  from Last 3 Encounters:  02/08/18 148 lb 12.8 oz (67.5 kg)  12/14/17 143 lb 12.8 oz (65.2 kg)  10/15/17 159 lb 8 oz (72.3 kg)      Studies/Labs Reviewed:   EKG:  EKG is not ordered today.   Recent Labs: 10/12/2017: TSH 0.369 10/14/2017: ALT 111 10/15/2017: Magnesium 1.8 12/14/2017: BUN 13; Creatinine, Ser 0.72; Hemoglobin 13.2; Platelets 313; Potassium 4.5; Sodium 134   Lipid Panel No results found for: CHOL, TRIG, HDL, CHOLHDL, VLDL, LDLCALC, LDLDIRECT  Additional studies/ records that were reviewed today include:  Coronary CTA 10/3/2019IMPRESSION: 1. Coronary calcium score of 0. This was 0 percentile for age and sex matched control.   2. Normal coronary origin with right dominance.   3. No evidence of CAD.   Cherlynn Kaiser     Electronically Signed   By: Cherlynn Kaiser   On: 01/27/2018 21:08  2D echo 8/27/2019Study Conclusions   - Left ventricle: The cavity size was normal. Systolic function was   normal. The estimated ejection fraction was in the range of 60%   to 65%. Wall motion was normal; there were no regional wall   motion abnormalities. Features are consistent with a pseudonormal   left ventricular filling pattern, with concomitant abnormal   relaxation and increased filling pressure (grade 2 diastolic   dysfunction). Doppler parameters are consistent with elevated   ventricular end-diastolic filling pressure. - Mitral valve: Systolic bowing without prolapse. There was mild   regurgitation directed centrally. - Left atrium: The atrium was mildly to moderately dilated.      ASSESSMENT:    1. AF  (paroxysmal atrial fibrillation) ([place])   2. Essential hypertension   3. Mixed hyperlipidemia      PLAN:  In order of problems listed above:  PAF in the setting of UTI sepsis.  Chads vas score equals 3.  Initially not anticoagulated secondary to thrombocytopenia but then placed on Eliquis.  Plan was to stop this if 2D echo is normal.  2D echo did show mild to moderately dilated left atrium(45 mm) and grade 2 DD.  30-day monitor placed.  First 12 days showed no atrial fibrillation.  I discussed the need for Eliquis with Dr. Tamala Julian.  He says if she does not have a high PACs load or any atrial fibrillation on the 30-day monitor she can stop the Eliquis and stay on aspirin 81 mg once daily.  I also called the patient and advised her of our plan.  Essential hypertension on losartan and metoprolol.  The pressure up a little today.  Patient had gone the wrong way and was anxious to get here.  She will monitor her blood pressures at home and call if they remain higher than 135/85.  Hyperlipidemia on Zocor managed by PCP  Abnormal EKG dating back to 2014 with anterior T wave inversion and poor R wave progression.  Coronary CT shows calcium score to be 0 and no underlying CAD.    Medication Adjustments/Labs and Tests Ordered: Current medicines are reviewed at length with the patient today.  Concerns regarding medicines are outlined above.  Medication changes, Labs and Tests ordered today are listed in the Patient Instructions below. Patient Instructions  Medication Instructions:  Your physician recommends that you continue on your current medications as directed. Please refer to the Current Medication list given to you today.  If you need a refill on your cardiac medications before your next appointment, please call your pharmacy.   Lab work: None ordered  If  you have labs (blood work) drawn today and your tests are completely normal, you will receive your results only by: Marland Kitchen MyChart Message (if  you have MyChart) OR . A paper copy in the mail If you have any lab test that is abnormal or we need to change your treatment, we will call you to review the results.  Testing/Procedures: None ordered  Follow-Up: At [facility], you and your health needs are our priority.  As part of our continuing mission to provide you with exceptional heart care, we have created designated Provider Care Teams.  These Care Teams include your primary Cardiologist (physician) and Advanced Practice Providers (APPs -  Physician Assistants and Nurse Practitioners) who all work together to provide you with the care you need, when you need it. You will need a follow up appointment in 3 months.  Please call our office 2 months in advance to schedule this appointment.  You may see Sinclair Grooms, MD (pt new pt and has yet to see him)  Any Other Special Instructions Will Be Listed Below (If Applicable).  -- Monitor your blood pressure at home.  Call our office if it's continuously running over 135/85 -- We will call you with the full monitor results       Signed, Ermalinda Barrios, PA-C  02/08/2018 3:04 PM    [address], [place], [place]  [address] Phone: [phone]; Fax: [phone]

## 2018-02-07 NOTE — Telephone Encounter (Signed)
New Message:     Patient calling for ECHO Results

## 2018-02-07 NOTE — Telephone Encounter (Signed)
Patient calling for her Cardiac CT results. Made patient aware of results. Patient verbalized understanding and thanked me for the call.

## 2018-02-07 NOTE — Telephone Encounter (Signed)
-----   Message from Imogene Burn, [place] sent at 02/01/2018  1:35 PM EDT ----- Calcium score is 0 and no evidence of CAD with very minimal plaque on coronary CT.  Excellent test.

## 2018-02-08 ENCOUNTER — Telehealth: Payer: Self-pay | Admitting: *Deleted

## 2018-02-08 ENCOUNTER — Ambulatory Visit (INDEPENDENT_AMBULATORY_CARE_PROVIDER_SITE_OTHER): Payer: Medicare Other | Admitting: Physician Assistant

## 2018-02-08 ENCOUNTER — Encounter: Payer: Self-pay | Admitting: Physician Assistant

## 2018-02-08 VITALS — BP 148/82 | HR 58 | Ht 65.0 in | Wt 148.8 lb

## 2018-02-08 DIAGNOSIS — E782 Mixed hyperlipidemia: Secondary | ICD-10-CM

## 2018-02-08 DIAGNOSIS — I2584 Coronary atherosclerosis due to calcified coronary lesion: Secondary | ICD-10-CM | POA: Diagnosis not present

## 2018-02-08 DIAGNOSIS — I10 Essential (primary) hypertension: Secondary | ICD-10-CM | POA: Diagnosis not present

## 2018-02-08 DIAGNOSIS — I2510 Atherosclerotic heart disease of native coronary artery without angina pectoris: Secondary | ICD-10-CM

## 2018-02-08 DIAGNOSIS — I480 Paroxysmal atrial fibrillation: Secondary | ICD-10-CM

## 2018-02-08 NOTE — Telephone Encounter (Signed)
Error

## 2018-02-08 NOTE — Patient Instructions (Signed)
Medication Instructions:  Your physician recommends that you continue on your current medications as directed. Please refer to the Current Medication list given to you today.  If you need a refill on your cardiac medications before your next appointment, please call your pharmacy.   Lab work: None ordered  If you have labs (blood work) drawn today and your tests are completely normal, you will receive your results only by: Marland Kitchen MyChart Message (if you have MyChart) OR . A paper copy in the mail If you have any lab test that is abnormal or we need to change your treatment, we will call you to review the results.  Testing/Procedures: None ordered  Follow-Up: At [facility], you and your health needs are our priority.  As part of our continuing mission to provide you with exceptional heart care, we have created designated Provider Care Teams.  These Care Teams include your primary Cardiologist (physician) and Advanced Practice Providers (APPs -  Physician Assistants and Nurse Practitioners) who all work together to provide you with the care you need, when you need it. You will need a follow up appointment in 3 months.  Please call our office 2 months in advance to schedule this appointment.  You may see Sinclair Grooms, MD (pt new pt and has yet to see him)  Any Other Special Instructions Will Be Listed Below (If Applicable).  -- Monitor your blood pressure at home.  Call our office if it's continuously running over 135/85 -- We will call you with the full monitor results

## 2018-03-07 ENCOUNTER — Telehealth: Payer: Self-pay | Admitting: *Deleted

## 2018-03-07 NOTE — Telephone Encounter (Signed)
-----   Message from Belva Crome, MD sent at 03/05/2018 12:10 PM EST ----- Let the patient know there is no evidence of atrial fibrillation. Okay to stop blood thinner. A copy will be sent to [name], MD

## 2018-04-15 ENCOUNTER — Encounter: Payer: Self-pay | Admitting: Interventional Cardiology

## 2018-05-10 NOTE — Progress Notes (Signed)
Cardiology Office Note:    Date:  05/11/2018   ID:  Erika [name], DOB 1946/02/01, MRN [id number]  PCP:  [name], MD  Cardiologist:  Sinclair Grooms, MD   Referring MD: [name], MD   Chief Complaint  Patient presents with  . Atrial Fibrillation    History of Present Illness:    Erika [name] is a 73 y.o. female with a hx of sepsis stress related paroxysmal atrial fibrillation.  Attempting to determine if long-term anticoagulation therapy is needed.  Completed a 30-day monitor.  Coronary CTA has demonstrated widely patent coronaries.  LV function is normal by echo.  She is doing well.  She denies syncope, headache, palpitations, dyspnea, orthopnea, and PND.  She reminds me that SIADH has been a significant problem for her.  We were unable to use diuretics because of this.  Her sodium levels tend to run less than 135 and can be quickly plummeted into the 120s with stress or diuretics.  Blood pressures at home have been consistently above 433 mmHg systolic.  Cardiac evaluation revealed widely patent coronaries, calcium score of 0, and normal systolic function.    Past Medical History:  Diagnosis Date  . A-fib ([place])   . Adrenal nodule ([place])   . Anxiety   . Arthritis   . Atrophic vaginitis   . Chronic hyponatremia   . Female climacteric state   . GERD (gastroesophageal reflux disease)   . Hypercholesteremia   . Hyperlipidemia   . Hypertension   . Leukopenia   . Recurrent UTI   . Right thyroid nodule   . Thrombocytopenia ([place])     Past Surgical History:  Procedure Laterality Date  . ESOPHAGOGASTRODUODENOSCOPY    . HERNIA REPAIR     x2  groin  . REPLACEMENT TOTAL KNEE     right knee  . SKIN CANCER EXCISION  05/2017   Excised from calf area  . TUBAL LIGATION      Current Medications: Current Meds  Medication Sig  . aspirin EC 81 MG tablet Take 81 mg by mouth daily.  . cetirizine (ZYRTEC) 10 MG tablet Take 10 mg by mouth daily.  Marland Kitchen esomeprazole  (NEXIUM) 40 MG capsule Take 40 mg by mouth daily.  . famotidine (PEPCID) 20 MG tablet Take 20 mg by mouth daily.  . fluticasone (FLONASE) 50 MCG/ACT nasal spray INSTILL 2 SPRAYS IN EACH NOSTRIL EVERY DAY.  . Lactobacillus-Inulin ([place] PO) Take 1 tablet by mouth daily.  Marland Kitchen LORazepam (ATIVAN) 0.5 MG tablet Take 0.5 mg by mouth daily as needed for anxiety.  Marland Kitchen losartan (COZAAR) 50 MG tablet Take 50 mg by mouth daily.  . metoprolol tartrate (LOPRESSOR) 25 MG tablet Take 25 mg by mouth 2 (two) times daily.   . Multiple Vitamins-Minerals (MULTIVITAMIN WITH MINERALS) tablet Take 1 tablet by mouth daily.  . nitrofurantoin (MACRODANTIN) 50 MG capsule Take 50 mg by mouth daily.  . simvastatin (ZOCOR) 20 MG tablet Take 20 mg by mouth daily.     Allergies:   Codeine; Hydrochlorothiazide; and Strawberry flavor   Social History   Socioeconomic History  . Marital status: Divorced    Spouse name: Not on file  . Number of children: 3  . Years of education: Not on file  . Highest education level: Not on file  Occupational History  . Occupation: Art gallery manager Rep    Employer: [employer]  Social Needs  . Financial resource strain: Not on  file  . Food insecurity:    Worry: Not on file    Inability: Not on file  . Transportation needs:    Medical: Not on file    Non-medical: Not on file  Tobacco Use  . Smoking status: Never Smoker  . Smokeless tobacco: Never Used  Substance and Sexual Activity  . Alcohol use: No  . Drug use: No  . Sexual activity: Not on file  Lifestyle  . Physical activity:    Days per week: Not on file    Minutes per session: Not on file  . Stress: Not on file  Relationships  . Social connections:    Talks on phone: Not on file    Gets together: Not on file    Attends religious service: Not on file    Active member of club or organization: Not on file    Attends meetings of clubs or organizations: Not on file    Relationship  status: Not on file  Other Topics Concern  . Not on file  Social History Narrative  . Not on file     Family History: The patient's family history includes CVA in her father; Emphysema in her brother and sister; Heart failure in her mother; Hypertension in her father and mother.  ROS:   Please see the history of present illness.    None All other systems reviewed and are negative.  EKGs/Labs/Other Studies Reviewed:    The following studies were reviewed today: 30-day continuous monitor 01/12/2018: Study Highlights     Normal sinus rhythm  No significant arrhythmia  No events or complaints.   Normal study   Coronary CT Angie oh with morphology: Performed 01/27/2018: IMPRESSION: 1. Coronary calcium score of 0. This was 0 percentile for age and sex matched control.  2. Normal coronary origin with right dominance.  3. No evidence of CAD.   EKG:  EKG is not repeated.  Recent Labs: 10/12/2017: TSH 0.369 10/14/2017: ALT 111 10/15/2017: Magnesium 1.8 12/14/2017: BUN 13; Creatinine, Ser 0.72; Hemoglobin 13.2; Platelets 313; Potassium 4.5; Sodium 134  Recent Lipid Panel No results found for: CHOL, TRIG, HDL, CHOLHDL, VLDL, LDLCALC, LDLDIRECT  Physical Exam:    VS:  BP (!) 142/82   Pulse (!) 59   Ht 5\' 5"  (1.651 m)   Wt 151 lb 6.4 oz (68.7 kg)   SpO2 98%   BMI 25.19 kg/m     Wt Readings from Last 3 Encounters:  05/11/18 151 lb 6.4 oz (68.7 kg)  02/08/18 148 lb 12.8 oz (67.5 kg)  12/14/17 143 lb 12.8 oz (65.2 kg)     GEN: Doing well and appears younger than stated age.. No acute distress HEENT: Normal NECK: No JVD. LYMPHATICS: No lymphadenopathy CARDIAC: RRR.  no murmur, gallop, edema VASCULAR: Pulses reveal 2+ radial and carotid., Bruits are absent in the carotids. RESPIRATORY:  Clear to auscultation without rales, wheezing or rhonchi  ABDOMEN: Soft, non-tender, non-distended, No pulsatile mass, MUSCULOSKELETAL: No deformity  SKIN: Warm and  dry NEUROLOGIC:  Alert and oriented x 3 PSYCHIATRIC:  Normal affect   ASSESSMENT:    1. AF (paroxysmal atrial fibrillation) ([facility])   2. Essential hypertension   3. Mixed hyperlipidemia   4. Coronary artery calcification    PLAN:    In order of problems listed above:  1. No recurrent A. fib.  Chads VASC is 1.  No structural heart disease or evidence of atrial fibrillation on prolonged monitoring.  Anticoagulation therapy was discontinued. 2. Blood pressure  is higher than the 130/80 mmHg ideal.  Increase losartan to 100 mg/day.  Heart rate is relatively low on low-dose beta-blocker therapy.  2-week bmet and follow-up in blood pressure clinic.  Next add on would likely be amlodipine. 3. With a calcium score of 0 and no evidence of atherosclerosis, we have decided at her age that therapy for hyperlipidemia is not necessary. 4. On a formal coronary CT angiogram, calcium score 0.  Clinical follow-up in 1 year.  The natural history of atrial fibrillation was discussed.  The inability to cure and the possibility of recurrences was clearly stated.  Management strategies including rhythm control (antiarrhythmic therapy), rate control (beta-blocker therapy or AV node blocking calcium channel blocker therapy), and ablation and/or pacemaker therapy were reviewed.  Stroke risk (as determined by CHADS VASC score >1) was discussed relative to the patient's individual profile.  The expected duration/permanence of anti-coagulation therapy was determined based on the individual risk score.  Coumadin versus NOAC therapy was reviewed, highlighting the lower bleeding risk and improved safety with NOAC therapy.    She is cautioned that atrial fibrillation is likely to recur.  Medication Adjustments/Labs and Tests Ordered: Current medicines are reviewed at length with the patient today.  Concerns regarding medicines are outlined above.  No orders of the defined types were placed in this encounter.  No orders of  the defined types were placed in this encounter.   There are no Patient Instructions on file for this visit.   Signed, Sinclair Grooms, MD  05/11/2018 9:31 AM    [place]

## 2018-05-11 ENCOUNTER — Encounter: Payer: Self-pay | Admitting: Interventional Cardiology

## 2018-05-11 ENCOUNTER — Ambulatory Visit (INDEPENDENT_AMBULATORY_CARE_PROVIDER_SITE_OTHER): Payer: Medicare Other | Admitting: Interventional Cardiology

## 2018-05-11 VITALS — BP 142/82 | HR 59 | Ht 65.0 in | Wt 151.4 lb

## 2018-05-11 DIAGNOSIS — I480 Paroxysmal atrial fibrillation: Secondary | ICD-10-CM

## 2018-05-11 DIAGNOSIS — I2510 Atherosclerotic heart disease of native coronary artery without angina pectoris: Secondary | ICD-10-CM

## 2018-05-11 DIAGNOSIS — I10 Essential (primary) hypertension: Secondary | ICD-10-CM | POA: Diagnosis not present

## 2018-05-11 DIAGNOSIS — I2584 Coronary atherosclerosis due to calcified coronary lesion: Secondary | ICD-10-CM | POA: Diagnosis not present

## 2018-05-11 DIAGNOSIS — E782 Mixed hyperlipidemia: Secondary | ICD-10-CM | POA: Diagnosis not present

## 2018-05-11 MED ORDER — LOSARTAN POTASSIUM 100 MG PO TABS: 100.0000 mg | ORAL_TABLET | Freq: Every day | ORAL | 3 refills | Status: DC

## 2018-05-11 NOTE — Patient Instructions (Signed)
Medication Instructions:  Your physician has recommended you make the following change in your medication:   INCREASE: losartan to 100 mg by mouth once a day  If you need a refill on your cardiac medications before your next appointment, please call your pharmacy.   Lab work: Your physician recommends that you return for lab work on 05/26/18 for BMET  If you have labs (blood work) drawn today and your tests are completely normal, you will receive your results only by: Marland Kitchen MyChart Message (if you have MyChart) OR . A paper copy in the mail If you have any lab test that is abnormal or we need to change your treatment, we will call you to review the results.  Testing/Procedures: None ordered  Follow-Up: Follow up in the Hypertension Clinic on 05/26/18 at 9:30 AM  At [facility], you and your health needs are our priority.  As part of our continuing mission to provide you with exceptional heart care, we have created designated Provider Care Teams.  These Care Teams include your primary Cardiologist (physician) and Advanced Practice Providers (APPs -  Physician Assistants and Nurse Practitioners) who all work together to provide you with the care you need, when you need it. . You will need a follow up appointment in 1 year.  Please call our office 2 months in advance to schedule this appointment.  You may see Sinclair Grooms, MD or one of the following Advanced Practice Providers on your designated Care Team:   . Truitt Merle, NP . Cecilie Kicks, NP . Kathyrn Drown, NP   Any Other Special Instructions Will Be Listed Below (If Applicable).

## 2018-05-26 ENCOUNTER — Ambulatory Visit (INDEPENDENT_AMBULATORY_CARE_PROVIDER_SITE_OTHER): Payer: Medicare Other | Admitting: Pharmacist

## 2018-05-26 ENCOUNTER — Other Ambulatory Visit: Payer: Medicare Other | Admitting: *Deleted

## 2018-05-26 VITALS — BP 152/90 | HR 57

## 2018-05-26 DIAGNOSIS — E782 Mixed hyperlipidemia: Secondary | ICD-10-CM

## 2018-05-26 DIAGNOSIS — I2510 Atherosclerotic heart disease of native coronary artery without angina pectoris: Secondary | ICD-10-CM

## 2018-05-26 DIAGNOSIS — I480 Paroxysmal atrial fibrillation: Secondary | ICD-10-CM

## 2018-05-26 DIAGNOSIS — I10 Essential (primary) hypertension: Secondary | ICD-10-CM | POA: Diagnosis not present

## 2018-05-26 DIAGNOSIS — I2584 Coronary atherosclerosis due to calcified coronary lesion: Secondary | ICD-10-CM

## 2018-05-26 LAB — BASIC METABOLIC PANEL
BUN / CREAT RATIO: 15 (ref 12–28)
BUN: 12 mg/dL (ref 8–27)
CO2: 23 mmol/L (ref 20–29)
Calcium: 9.6 mg/dL (ref 8.7–10.3)
Chloride: 96 mmol/L (ref 96–106)
Creatinine, Ser: 0.82 mg/dL (ref 0.57–1.00)
GFR calc Af Amer: 82 mL/min/{1.73_m2} (ref >59)
GFR calc non Af Amer: 71 mL/min/{1.73_m2} (ref >59)
GLUCOSE: 93 mg/dL (ref 65–99)
POTASSIUM: 4.6 mmol/L (ref 3.5–5.2)
SODIUM: 134 mmol/L (ref 134–144)

## 2018-05-26 MED ORDER — AMLODIPINE BESYLATE 5 MG PO TABS: 5.0000 mg | ORAL_TABLET | Freq: Every day | ORAL | 5 refills | Status: DC

## 2018-05-26 NOTE — Patient Instructions (Addendum)
It was nice to meet you  Your blood pressure goal is < 130/60mmHg  Restart taking amlodipine 5mg  daily - take this in the evening  Continue taking your losartan and metoprolol  Increase your walking  Continue to monitor your blood pressure at home and record your readings  Follow up in clinic in 4 weeks for a blood pressure check. You can bring in your home blood pressure readings and cuff to this visit

## 2018-05-26 NOTE — Progress Notes (Signed)
Tolvaptan

## 2018-05-26 NOTE — Progress Notes (Signed)
Patient ID: Erika [name]                 DOB: 03/11/1946                      MRN: [id number]     HPI: Erika [name] is a 73 y.o. female referred by Dr. Tamala Julian to HTN clinic. PMH is significant for HTN, HLD, SIADH with chronic hyponatremia, and sepsis stress related PAF. Calcium score checked in October 2019 and was 0 with no evidence of CAD. At last visit with Dr Tamala Julian 2 weeks ago, BP was elevated at 142/82 and losartan was increased to 100mg  daily. Pt presents today for BMET and BP follow up.  Pt presents today in good spirits. She reports tolerating her increased dose of losartan well. Denies dizziness, blurred vision, or falls. Occasional headaches in the AM when she wakes up. She has been under stress with increased responsibilities at her church - she is planning on cutting back. She also lives with and helps to care for her son who has a head injury and daughter in law who is mentally handicapped. She took her BP medications about 2 hours ago. Denies NSAID use. Has been limiting fluid intake to 64 oz each day due to SIADH. Home BP readings all elevated ranging 140-180/80s.  Current HTN meds: losartan 100mg  daily (AM), metoprolol tartrate 25mg  BID Previously tried: avoid diuretics due to Hyponatremia with SIADH BP goal: <130/2mmHg  Family History: The patient's family history includes CVA in her father; Emphysema in her brother and sister; Heart failure in her mother; Hypertension in her father and mother.  Social History: Denies tobacco, alcohol, and illicit drug use.  Diet: 1 Gatorade a day to help keep Na level up. Breakfast - oatmeal. Lunch - leftovers, sandwich or salad. Dinner - meat (likes hamburgers), vegetable and fruit. 64 ounce fluid restriction daily due to SIADH  Exercise: PT exercises, used to walk 15 minutes a day but has not been lately. Stays active with church and caring for family.  Home BP readings: 140-180s/70-80s at home, HR low 60s  Wt Readings from Last 3  Encounters:  05/11/18 151 lb 6.4 oz (68.7 kg)  02/08/18 148 lb 12.8 oz (67.5 kg)  12/14/17 143 lb 12.8 oz (65.2 kg)   BP Readings from Last 3 Encounters:  05/11/18 (!) 142/82  02/08/18 (!) 148/82  01/27/18 136/66   Pulse Readings from Last 3 Encounters:  05/11/18 (!) 59  02/08/18 (!) 58  12/14/17 66    Renal function: CrCl cannot be calculated (Patient's most recent lab result is older than the maximum 21 days allowed.).  Past Medical History:  Diagnosis Date  . A-fib ([place])   . Adrenal nodule ([place])   . Anxiety   . Arthritis   . Atrophic vaginitis   . Chronic hyponatremia   . Female climacteric state   . GERD (gastroesophageal reflux disease)   . Hypercholesteremia   . Hyperlipidemia   . Hypertension   . Leukopenia   . Recurrent UTI   . Right thyroid nodule   . Thrombocytopenia ([place])     Current Outpatient Medications on File Prior to Visit  Medication Sig Dispense Refill  . aspirin EC 81 MG tablet Take 81 mg by mouth daily.    . cetirizine (ZYRTEC) 10 MG tablet Take 10 mg by mouth daily.    Marland Kitchen esomeprazole (NEXIUM) 40 MG capsule Take 40 mg by mouth daily.  5  .  famotidine (PEPCID) 20 MG tablet Take 20 mg by mouth daily.    . fluticasone (FLONASE) 50 MCG/ACT nasal spray INSTILL 2 SPRAYS IN EACH NOSTRIL EVERY DAY. 16 g 3  . Lactobacillus-Inulin ([place] PO) Take 1 tablet by mouth daily.    Marland Kitchen LORazepam (ATIVAN) 0.5 MG tablet Take 0.5 mg by mouth daily as needed for anxiety.    Marland Kitchen losartan (COZAAR) 100 MG tablet Take 1 tablet (100 mg total) by mouth daily. 90 tablet 3  . metoprolol tartrate (LOPRESSOR) 25 MG tablet Take 25 mg by mouth 2 (two) times daily.     . Multiple Vitamins-Minerals (MULTIVITAMIN WITH MINERALS) tablet Take 1 tablet by mouth daily.    . nitrofurantoin (MACRODANTIN) 50 MG capsule Take 50 mg by mouth daily.    . simvastatin (ZOCOR) 20 MG tablet Take 20 mg by mouth daily.     No current facility-administered medications on file  prior to visit.     Allergies  Allergen Reactions  . Codeine Other (See Comments)    Unknown   . Hydrochlorothiazide Other (See Comments)    Unknown   . Strawberry Flavor Itching     Assessment/Plan:  1. Hypertension - BP above goal <130/41mmHg in clinic and at home. Will resume amlodipine 5mg  daily (she used to take this but it was discontinued at hospital discharge in June 2020). Continue losartan 100mg  daily and metoprolol tartrate 25mg  BID. Encouraged pt to increase walking. She is working on [employer], building services. F/u in HTN clinic in 4 weeks for BP check. Advised pt to bring home BP cuff and readings to her next visit.  Megan E. Supple, PharmD, BCACP, [facility] [address], [place], [place] [address] Phone: [phone]; Fax: [phone] 05/26/2018 10:03 AM

## 2018-06-10 ENCOUNTER — Other Ambulatory Visit: Payer: Self-pay | Admitting: Family Medicine

## 2018-06-10 DIAGNOSIS — Z1231 Encounter for screening mammogram for malignant neoplasm of breast: Secondary | ICD-10-CM

## 2018-06-21 ENCOUNTER — Ambulatory Visit (INDEPENDENT_AMBULATORY_CARE_PROVIDER_SITE_OTHER): Payer: Medicare Other | Admitting: Pharmacist

## 2018-06-21 ENCOUNTER — Encounter: Payer: Self-pay | Admitting: Pharmacist

## 2018-06-21 VITALS — BP 132/80 | HR 70

## 2018-06-21 DIAGNOSIS — I2510 Atherosclerotic heart disease of native coronary artery without angina pectoris: Secondary | ICD-10-CM | POA: Diagnosis not present

## 2018-06-21 DIAGNOSIS — I10 Essential (primary) hypertension: Secondary | ICD-10-CM | POA: Diagnosis not present

## 2018-06-21 DIAGNOSIS — I2584 Coronary atherosclerosis due to calcified coronary lesion: Secondary | ICD-10-CM | POA: Diagnosis not present

## 2018-06-21 NOTE — Patient Instructions (Signed)
It was nice to see you today  Your blood pressure is much improved and very close to goal <130/54mmHg  Continue taking your medications and call Kearra Calkin, Pharmacist with any blood pressure related concerns [phone]

## 2018-06-21 NOTE — Progress Notes (Signed)
Patient ID: Erika [name]                 DOB: 1946/04/05                      MRN: [id number]     HPI: Erika [name] is a 73 y.o. female referred by Dr. Tamala Julian to HTN clinic. PMH is significant for HTN, HLD, SIADH with chronic hyponatremia, and sepsis stress related PAF. Calcium score checked in October 2019 and was 0 with no evidence of CAD. At last visit in HTN clinic 1 month ago, BP was elevated at 152/90 and amlodipine 5mg  daily was resumed. Pt presents today for follow up.  Pt presents today in good spirits. She reports tolerating her medications well. Denies dizziness, blurred vision, falls, or LEE. Occasional headaches if her BP is running higher. She recently decreased her duties at her church, which has helped to decrease her stress level quite a bit. She lives with and helps to care for her son who has a head injury and daughter in law who is mentally handicapped. She did not take her AM BP medications yet today (typically takes them 7am-7:30am). Denies NSAID use. Has been limiting fluid intake to 64 oz each day due to SIADH. Drinks a Gatorade each day to help prevent leg cramps. Home BP readings have improved to mostly 120-130s/70-80s. Home BP reading of 149/86 in clinic did not correlate well with manual reading of 132/80, checked 2x. She has had an Omron bicep cuff for 8 years.  Current HTN meds: amlodipine 5mg  daily (PM), losartan 100mg  daily (AM), metoprolol tartrate 25mg  BID Previously tried: avoid diuretics due to Hyponatremia with SIADH BP goal: <130/36mmHg  Family History: The patient's family history includes CVA in her father; Emphysema in her brother and sister; Heart failure in her mother; Hypertension in her father and mother.  Social History: Denies tobacco, alcohol, and illicit drug use.  Diet: 1 Gatorade a day to help keep Na level up. Breakfast - oatmeal. Lunch - leftovers, sandwich or salad. Dinner - meat (likes hamburgers), vegetable and fruit. 64 ounce fluid restriction  daily due to SIADH  Exercise: PT exercises every day, used to walk 15 minutes a day but has not been lately. Stays active with church and caring for family.  Home BP readings: 115/70 low, 143/82 high, mostly 120-130s/70-80s. HR 55-70  Wt Readings from Last 3 Encounters:  05/11/18 151 lb 6.4 oz (68.7 kg)  02/08/18 148 lb 12.8 oz (67.5 kg)  12/14/17 143 lb 12.8 oz (65.2 kg)   BP Readings from Last 3 Encounters:  05/26/18 (!) 152/90  05/11/18 (!) 142/82  02/08/18 (!) 148/82   Pulse Readings from Last 3 Encounters:  05/26/18 (!) 57  05/11/18 (!) 59  02/08/18 (!) 58    Renal function: CrCl cannot be calculated (Patient's most recent lab result is older than the maximum 21 days allowed.).  Past Medical History:  Diagnosis Date  . A-fib ([place])   . Adrenal nodule ([place])   . Anxiety   . Arthritis   . Atrophic vaginitis   . Chronic hyponatremia   . Female climacteric state   . GERD (gastroesophageal reflux disease)   . Hypercholesteremia   . Hyperlipidemia   . Hypertension   . Leukopenia   . Recurrent UTI   . Right thyroid nodule   . Thrombocytopenia ([place])     Current Outpatient Medications on File Prior to Visit  Medication Sig Dispense Refill  .  amLODipine (NORVASC) 5 MG tablet Take 1 tablet (5 mg total) by mouth daily. 30 tablet 5  . aspirin EC 81 MG tablet Take 81 mg by mouth daily.    . cetirizine (ZYRTEC) 10 MG tablet Take 10 mg by mouth daily.    Marland Kitchen esomeprazole (NEXIUM) 40 MG capsule Take 40 mg by mouth daily.  5  . famotidine (PEPCID) 20 MG tablet Take 20 mg by mouth daily.    . fluticasone (FLONASE) 50 MCG/ACT nasal spray INSTILL 2 SPRAYS IN EACH NOSTRIL EVERY DAY. 16 g 3  . Lactobacillus-Inulin ([place] PO) Take 1 tablet by mouth daily.    Marland Kitchen LORazepam (ATIVAN) 0.5 MG tablet Take 0.5 mg by mouth daily as needed for anxiety.    Marland Kitchen losartan (COZAAR) 100 MG tablet Take 1 tablet (100 mg total) by mouth daily. 90 tablet 3  . metoprolol tartrate  (LOPRESSOR) 25 MG tablet Take 25 mg by mouth 2 (two) times daily.     . Multiple Vitamins-Minerals (MULTIVITAMIN WITH MINERALS) tablet Take 1 tablet by mouth daily.    . nitrofurantoin (MACRODANTIN) 50 MG capsule Take 50 mg by mouth daily.    . simvastatin (ZOCOR) 20 MG tablet Take 20 mg by mouth daily.     No current facility-administered medications on file prior to visit.     Allergies  Allergen Reactions  . Codeine Other (See Comments)    Unknown   . Hydrochlorothiazide Other (See Comments)    Unknown   . Strawberry Flavor Itching     Assessment/Plan:  1. Hypertension - BP much improved and very close to goal <130/13mmHg in clinic, despite not taking morning BP medications yet today. She has improved her stress levels quite a bit since her last visit. Will continue amlodipine 5mg  daily, losartan 100mg  daily and metoprolol tartrate 25mg  BID. Encouraged pt to stay active, as well as purchase a new BP cuff since her current cuff was 63mmHg off from clinic reading today. F/u in HTN clinic as needed.  Megan E. Supple, PharmD, BCACP, [facility] [address] [place], [place] [address] Phone: [phone]; Fax: [phone] 06/21/2018 7:15 AM

## 2018-06-23 ENCOUNTER — Ambulatory Visit: Payer: Medicare Other

## 2018-07-12 ENCOUNTER — Other Ambulatory Visit: Payer: Self-pay

## 2018-07-12 ENCOUNTER — Ambulatory Visit
Admission: RE | Admit: 2018-07-12 | Discharge: 2018-07-12 | Disposition: A | Discharge status: Home / Self Care | Payer: Medicare Other | Source: Ambulatory Visit | Attending: Family Medicine | Admitting: Family Medicine

## 2018-07-12 DIAGNOSIS — E279 Disorder of adrenal gland, unspecified: Secondary | ICD-10-CM | POA: Diagnosis not present

## 2018-07-12 DIAGNOSIS — Z1231 Encounter for screening mammogram for malignant neoplasm of breast: Secondary | ICD-10-CM | POA: Diagnosis not present

## 2018-07-12 DIAGNOSIS — E041 Nontoxic single thyroid nodule: Secondary | ICD-10-CM | POA: Diagnosis not present

## 2018-07-12 DIAGNOSIS — I10 Essential (primary) hypertension: Secondary | ICD-10-CM | POA: Diagnosis not present

## 2018-11-16 ENCOUNTER — Other Ambulatory Visit: Payer: Self-pay | Admitting: Interventional Cardiology

## 2018-11-16 MED ORDER — AMLODIPINE BESYLATE 5 MG PO TABS: 5.0000 mg | ORAL_TABLET | Freq: Every day | ORAL | 5 refills | Status: DC

## 2019-01-24 DIAGNOSIS — E7800 Pure hypercholesterolemia, unspecified: Secondary | ICD-10-CM | POA: Diagnosis not present

## 2019-01-24 DIAGNOSIS — E278 Other specified disorders of adrenal gland: Secondary | ICD-10-CM | POA: Diagnosis not present

## 2019-01-24 DIAGNOSIS — Z79899 Other long term (current) drug therapy: Secondary | ICD-10-CM | POA: Diagnosis not present

## 2019-01-24 DIAGNOSIS — I10 Essential (primary) hypertension: Secondary | ICD-10-CM | POA: Diagnosis not present

## 2019-01-24 DIAGNOSIS — Z0000 Encounter for general adult medical examination without abnormal findings: Secondary | ICD-10-CM | POA: Diagnosis not present

## 2019-01-24 DIAGNOSIS — Z23 Encounter for immunization: Secondary | ICD-10-CM | POA: Diagnosis not present

## 2019-01-30 ENCOUNTER — Other Ambulatory Visit: Payer: Self-pay | Admitting: Family Medicine

## 2019-01-30 DIAGNOSIS — E278 Other specified disorders of adrenal gland: Secondary | ICD-10-CM

## 2019-02-14 ENCOUNTER — Ambulatory Visit
Admission: RE | Admit: 2019-02-14 | Discharge: 2019-02-14 | Disposition: A | Discharge status: Home / Self Care | Payer: Medicare Other | Source: Ambulatory Visit | Attending: Family Medicine | Admitting: Family Medicine

## 2019-02-14 DIAGNOSIS — K5730 Diverticulosis of large intestine without perforation or abscess without bleeding: Secondary | ICD-10-CM | POA: Diagnosis not present

## 2019-02-14 DIAGNOSIS — E279 Disorder of adrenal gland, unspecified: Secondary | ICD-10-CM | POA: Diagnosis not present

## 2019-02-14 DIAGNOSIS — E278 Other specified disorders of adrenal gland: Secondary | ICD-10-CM

## 2019-03-13 DIAGNOSIS — M5136 Other intervertebral disc degeneration, lumbar region: Secondary | ICD-10-CM | POA: Diagnosis not present

## 2019-03-13 DIAGNOSIS — M9902 Segmental and somatic dysfunction of thoracic region: Secondary | ICD-10-CM | POA: Diagnosis not present

## 2019-03-13 DIAGNOSIS — M47817 Spondylosis without myelopathy or radiculopathy, lumbosacral region: Secondary | ICD-10-CM | POA: Diagnosis not present

## 2019-03-13 DIAGNOSIS — M9904 Segmental and somatic dysfunction of sacral region: Secondary | ICD-10-CM | POA: Diagnosis not present

## 2019-03-13 DIAGNOSIS — M5137 Other intervertebral disc degeneration, lumbosacral region: Secondary | ICD-10-CM | POA: Diagnosis not present

## 2019-03-13 DIAGNOSIS — M9905 Segmental and somatic dysfunction of pelvic region: Secondary | ICD-10-CM | POA: Diagnosis not present

## 2019-03-13 DIAGNOSIS — S29012A Strain of muscle and tendon of back wall of thorax, initial encounter: Secondary | ICD-10-CM | POA: Diagnosis not present

## 2019-03-13 DIAGNOSIS — M9903 Segmental and somatic dysfunction of lumbar region: Secondary | ICD-10-CM | POA: Diagnosis not present

## 2019-03-16 DIAGNOSIS — M9904 Segmental and somatic dysfunction of sacral region: Secondary | ICD-10-CM | POA: Diagnosis not present

## 2019-03-16 DIAGNOSIS — M9903 Segmental and somatic dysfunction of lumbar region: Secondary | ICD-10-CM | POA: Diagnosis not present

## 2019-03-16 DIAGNOSIS — M9905 Segmental and somatic dysfunction of pelvic region: Secondary | ICD-10-CM | POA: Diagnosis not present

## 2019-03-16 DIAGNOSIS — M47817 Spondylosis without myelopathy or radiculopathy, lumbosacral region: Secondary | ICD-10-CM | POA: Diagnosis not present

## 2019-03-16 DIAGNOSIS — M5137 Other intervertebral disc degeneration, lumbosacral region: Secondary | ICD-10-CM | POA: Diagnosis not present

## 2019-03-16 DIAGNOSIS — M5136 Other intervertebral disc degeneration, lumbar region: Secondary | ICD-10-CM | POA: Diagnosis not present

## 2019-03-16 DIAGNOSIS — S29012A Strain of muscle and tendon of back wall of thorax, initial encounter: Secondary | ICD-10-CM | POA: Diagnosis not present

## 2019-03-16 DIAGNOSIS — M9902 Segmental and somatic dysfunction of thoracic region: Secondary | ICD-10-CM | POA: Diagnosis not present

## 2019-03-20 DIAGNOSIS — S29012A Strain of muscle and tendon of back wall of thorax, initial encounter: Secondary | ICD-10-CM | POA: Diagnosis not present

## 2019-03-20 DIAGNOSIS — M47817 Spondylosis without myelopathy or radiculopathy, lumbosacral region: Secondary | ICD-10-CM | POA: Diagnosis not present

## 2019-03-20 DIAGNOSIS — M9904 Segmental and somatic dysfunction of sacral region: Secondary | ICD-10-CM | POA: Diagnosis not present

## 2019-03-20 DIAGNOSIS — M9902 Segmental and somatic dysfunction of thoracic region: Secondary | ICD-10-CM | POA: Diagnosis not present

## 2019-03-20 DIAGNOSIS — M9903 Segmental and somatic dysfunction of lumbar region: Secondary | ICD-10-CM | POA: Diagnosis not present

## 2019-03-20 DIAGNOSIS — M5136 Other intervertebral disc degeneration, lumbar region: Secondary | ICD-10-CM | POA: Diagnosis not present

## 2019-03-20 DIAGNOSIS — M5137 Other intervertebral disc degeneration, lumbosacral region: Secondary | ICD-10-CM | POA: Diagnosis not present

## 2019-03-20 DIAGNOSIS — M9905 Segmental and somatic dysfunction of pelvic region: Secondary | ICD-10-CM | POA: Diagnosis not present

## 2019-03-22 DIAGNOSIS — Z20828 Contact with and (suspected) exposure to other viral communicable diseases: Secondary | ICD-10-CM | POA: Diagnosis not present

## 2019-05-04 ENCOUNTER — Other Ambulatory Visit: Payer: Self-pay

## 2019-05-04 MED ORDER — LOSARTAN POTASSIUM 100 MG PO TABS: 100.0000 mg | ORAL_TABLET | Freq: Every day | ORAL | 0 refills | Status: DC

## 2019-05-11 ENCOUNTER — Other Ambulatory Visit: Payer: Self-pay | Admitting: Interventional Cardiology

## 2019-05-11 MED ORDER — AMLODIPINE BESYLATE 5 MG PO TABS: 5.0000 mg | ORAL_TABLET | Freq: Every day | ORAL | 0 refills | Status: DC

## 2019-06-02 DIAGNOSIS — N390 Urinary tract infection, site not specified: Secondary | ICD-10-CM | POA: Diagnosis not present

## 2019-06-19 ENCOUNTER — Other Ambulatory Visit: Payer: Self-pay

## 2019-06-19 MED ORDER — AMLODIPINE BESYLATE 5 MG PO TABS: 5.0000 mg | ORAL_TABLET | Freq: Every day | ORAL | 0 refills | Status: DC

## 2019-07-11 DIAGNOSIS — M2011 Hallux valgus (acquired), right foot: Secondary | ICD-10-CM | POA: Diagnosis not present

## 2019-07-17 ENCOUNTER — Other Ambulatory Visit: Payer: Self-pay | Admitting: Interventional Cardiology

## 2019-07-17 MED ORDER — AMLODIPINE BESYLATE 5 MG PO TABS: 5.0000 mg | ORAL_TABLET | Freq: Every day | ORAL | 0 refills | Status: AC

## 2019-07-24 DIAGNOSIS — E279 Disorder of adrenal gland, unspecified: Secondary | ICD-10-CM | POA: Diagnosis not present

## 2019-07-24 DIAGNOSIS — E2839 Other primary ovarian failure: Secondary | ICD-10-CM | POA: Diagnosis not present

## 2019-07-24 DIAGNOSIS — I10 Essential (primary) hypertension: Secondary | ICD-10-CM | POA: Diagnosis not present

## 2019-07-24 DIAGNOSIS — E041 Nontoxic single thyroid nodule: Secondary | ICD-10-CM | POA: Diagnosis not present

## 2019-07-24 DIAGNOSIS — F419 Anxiety disorder, unspecified: Secondary | ICD-10-CM | POA: Diagnosis not present

## 2019-07-24 DIAGNOSIS — E7800 Pure hypercholesterolemia, unspecified: Secondary | ICD-10-CM | POA: Diagnosis not present

## 2019-07-24 DIAGNOSIS — Z79899 Other long term (current) drug therapy: Secondary | ICD-10-CM | POA: Diagnosis not present

## 2019-08-01 ENCOUNTER — Other Ambulatory Visit: Payer: Self-pay

## 2019-08-01 MED ORDER — LOSARTAN POTASSIUM 100 MG PO TABS: 100.0000 mg | ORAL_TABLET | Freq: Every day | ORAL | 0 refills | Status: AC

## 2019-08-02 ENCOUNTER — Other Ambulatory Visit: Payer: Self-pay | Admitting: Podiatry

## 2019-08-02 ENCOUNTER — Ambulatory Visit (INDEPENDENT_AMBULATORY_CARE_PROVIDER_SITE_OTHER): Payer: Medicare Other | Admitting: Podiatry

## 2019-08-02 ENCOUNTER — Ambulatory Visit (INDEPENDENT_AMBULATORY_CARE_PROVIDER_SITE_OTHER): Payer: Medicare Other

## 2019-08-02 ENCOUNTER — Other Ambulatory Visit: Payer: Self-pay

## 2019-08-02 DIAGNOSIS — M2041 Other hammer toe(s) (acquired), right foot: Secondary | ICD-10-CM | POA: Diagnosis not present

## 2019-08-02 DIAGNOSIS — M21619 Bunion of unspecified foot: Secondary | ICD-10-CM

## 2019-08-02 DIAGNOSIS — M21621 Bunionette of right foot: Secondary | ICD-10-CM

## 2019-08-04 ENCOUNTER — Other Ambulatory Visit: Payer: Self-pay | Admitting: Podiatry

## 2019-08-04 DIAGNOSIS — M21619 Bunion of unspecified foot: Secondary | ICD-10-CM

## 2019-08-04 DIAGNOSIS — M2041 Other hammer toe(s) (acquired), right foot: Secondary | ICD-10-CM

## 2019-08-06 NOTE — Progress Notes (Signed)
   HPI: 74 y.o. female presenting today as a new patient, referred by Dr. Gershon Mussel, with a chief complaint of pain to the right 2nd toe that began about two years ago. She states the pain is constant and reports associated redness. Wearing tight shoes increases the pain. She has been using toe crests for treatment. Patient is here for further evaluation and treatment.   Past Medical History:  Diagnosis Date  . A-fib ([place])   . Adrenal nodule ([place])   . Anxiety   . Arthritis   . Atrophic vaginitis   . Chronic hyponatremia   . Female climacteric state   . GERD (gastroesophageal reflux disease)   . Hypercholesteremia   . Hyperlipidemia   . Hypertension   . Leukopenia   . Recurrent UTI   . Right thyroid nodule   . Thrombocytopenia ([place])       Objective: Physical Exam General: The patient is alert and oriented x3 in no acute distress.  Dermatology: Skin is cool, dry and supple bilateral lower extremities. Negative for open lesions or macerations.  Vascular: Palpable pedal pulses bilaterally. No edema or erythema noted. Capillary refill within normal limits.  Neurological: Epicritic and protective threshold grossly intact bilaterally.   Musculoskeletal Exam: All pedal and ankle joints range of motion within normal limits bilateral. Muscle strength 5/5 in all groups bilateral. Hammertoe contracture deformity noted to digits 2, 3 and 4 of the right foot. Clinical evidence of Tailor's bunion deformity noted to the respective foot. There is a moderate pain on palpation range of motion of the fifth MPJ.   Radiographic Exam: Hammertoe contracture deformity noted to the interphalangeal joints and MPJ of the respective hammertoe digits mentioned on clinical musculoskeletal exam.   Increased intermetatarsal angle to the fourth interspace of the respective foot. Prominent fifth metatarsal head.    Assessment: 1. Hammertoe contracture noted to digits 2, 3, 4 right 2. Tailor's bunion deformity  right    Plan of Care:  1. Patient evaluated. X-Rays reviewed.  2. Patient wants surgery in October.  3. Recommended good shoe gear.  4. Return to clinic 1st week of August for surgical consult.   Spens a lot of time at the beach and her son's saltwater pool.     Edrick Kins, DPM [facility]  Dr. Edrick Kins, DPM    [address], [place] [address]                Office [phone]  Fax [phone]

## 2019-12-04 ENCOUNTER — Ambulatory Visit (INDEPENDENT_AMBULATORY_CARE_PROVIDER_SITE_OTHER): Payer: Medicare Other | Admitting: Podiatry

## 2019-12-04 ENCOUNTER — Other Ambulatory Visit: Payer: Self-pay

## 2019-12-04 DIAGNOSIS — M2041 Other hammer toe(s) (acquired), right foot: Secondary | ICD-10-CM | POA: Diagnosis not present

## 2019-12-04 DIAGNOSIS — M21621 Bunionette of right foot: Secondary | ICD-10-CM

## 2019-12-04 NOTE — Progress Notes (Signed)
   HPI: 74 y.o. female presenting today for follow-up evaluation of symptomatic hammertoes and tailor's bunion to the right foot greater than the left.  Patient states that she is ready to schedule surgery.  There is been no change and no new complaints at this time.  Past Medical History:  Diagnosis Date  . A-fib ([place])   . Adrenal nodule ([place])   . Anxiety   . Arthritis   . Atrophic vaginitis   . Chronic hyponatremia   . Female climacteric state   . GERD (gastroesophageal reflux disease)   . Hypercholesteremia   . Hyperlipidemia   . Hypertension   . Leukopenia   . Recurrent UTI   . Right thyroid nodule   . Thrombocytopenia ([place])       Objective: Physical Exam General: The patient is alert and oriented x3 in no acute distress.  Dermatology: Skin is cool, dry and supple bilateral lower extremities. Negative for open lesions or macerations.  Vascular: Palpable pedal pulses bilaterally. No edema or erythema noted. Capillary refill within normal limits.  Neurological: Epicritic and protective threshold grossly intact bilaterally.   Musculoskeletal Exam: All pedal and ankle joints range of motion within normal limits bilateral. Muscle strength 5/5 in all groups bilateral. Hammertoe contracture deformity noted to digits 2, 3 and 4 of the right foot.  The second digit hammertoe contracture is actually overlapping the great toe. Clinical evidence of Tailor's bunion deformity noted to the respective foot. There is a moderate pain on palpation range of motion of the fifth MPJ.    Assessment: 1. Hammertoe contracture noted to digits 2, 3, 4 right 2. Tailor's bunion deformity right    Plan of Care:  1. Patient evaluated. 2. Today we discussed the conservative versus surgical management of the presenting pathology. The patient opts for surgical management. All possible complications and details of the procedure were explained. All patient questions were answered. No guarantees were  expressed or implied. 3. Authorization for surgery was initiated today. Surgery will consist of PIPJ arthrodesis with MTPJ capsulotomy digits 2-4 right.  Tailor's bunionectomy with osteotomy right. 4.  Return to clinic 1 week postop  Spens a lot of time at the beach and her son's saltwater pool.     Edrick Kins, DPM [facility]  Dr. Edrick Kins, DPM    [address] [place], [place] [address]                Office [phone]  Fax [phone]

## 2019-12-08 IMAGING — CT CT ANGIO CHEST
3 of 11 series · 16 of 37 positions shown · IV contrast (Omni 300)
Comparison: Chest radiograph October 12, 2017; chest CT September 10, 2012;
CT abdomen and pelvis September 23, 2006

ADDENDUM:
Add to
CLINICAL DATA: Chest pain.  Abdominal pain.  Nausea and vomiting.

EXAM:
CT ANGIOGRAPHY CHEST
CT ABDOMEN AND PELVIS WITH CONTRAST
TECHNIQUE: Multidetector CT imaging of the chest was performed using the
standard protocol during bolus administration of intravenous
contrast. Multiplanar CT image reconstructions and MIPs were
obtained to evaluate the vascular anatomy. Multidetector CT imaging
of the abdomen and pelvis was performed using the standard protocol
during bolus administration of intravenous contrast.
CONTRAST:  100mL 4CFJRO-L66 IOPAMIDOL (4CFJRO-L66) INJECTION 76%

[Series 5: pe 2mm · axial · 0.62mm/px · z∈[+1012,+1150]mm · 5 of 105 slices shown]
[im 18/105  lung]
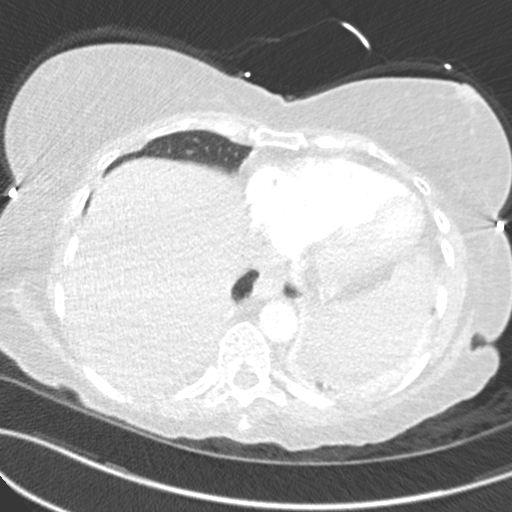
[im 35/105  lung]
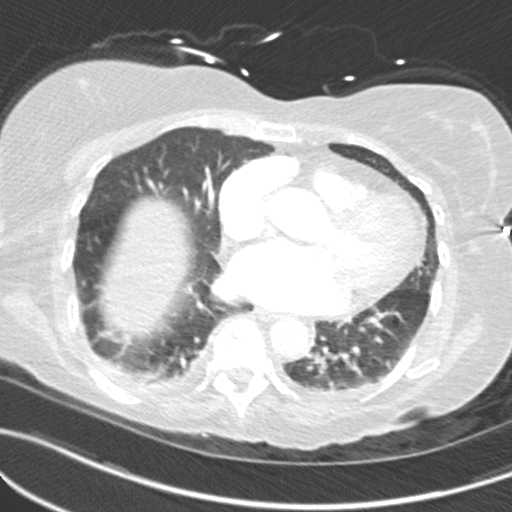
[im 53/105  lung]
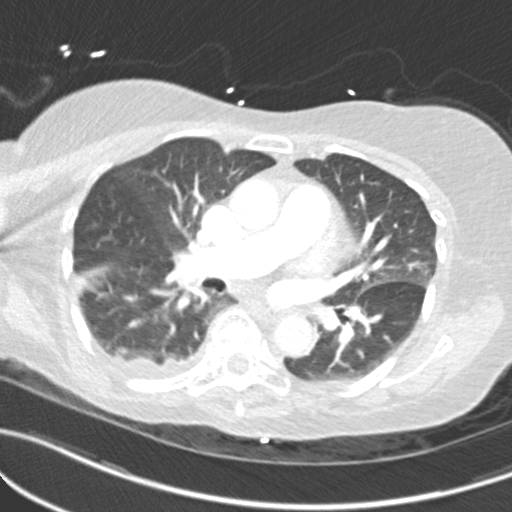
[im 70/105  lung]
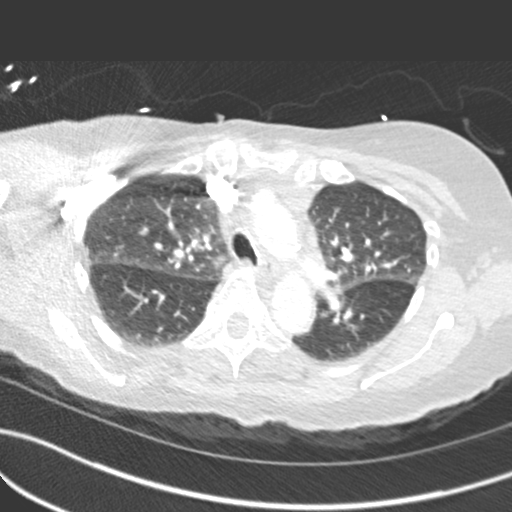
[im 87/105  lung]
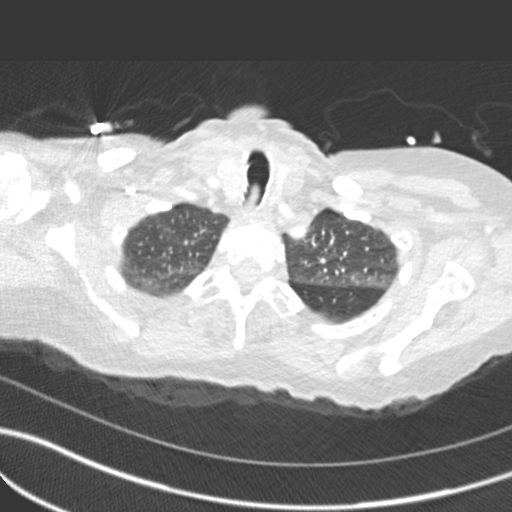

[Series 7: pe thins · axial · 0.62mm/px · z∈[+996,+1168]mm · 8 of 209 slices shown]
[im 18/209  lung]
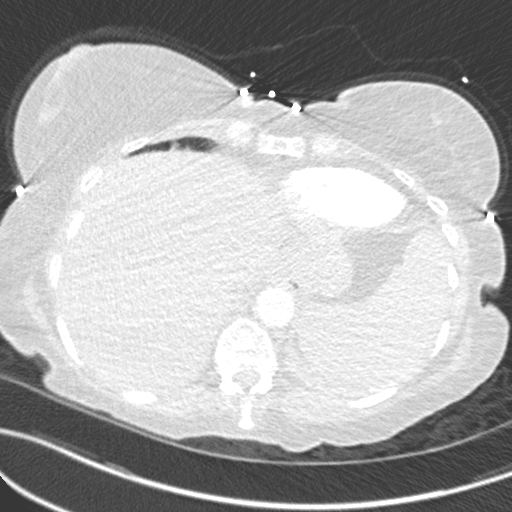
[im 53/209  lung]
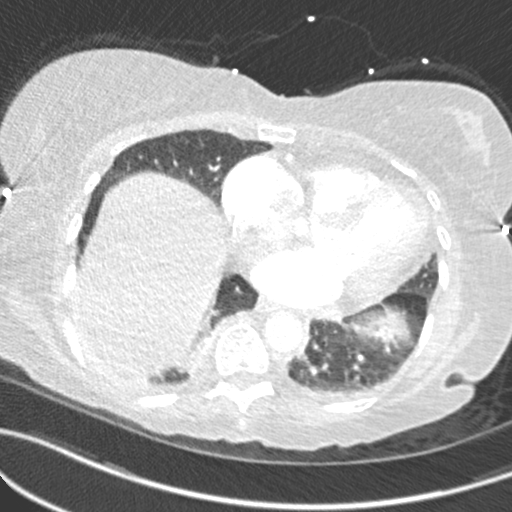
[im 70/209  lung]
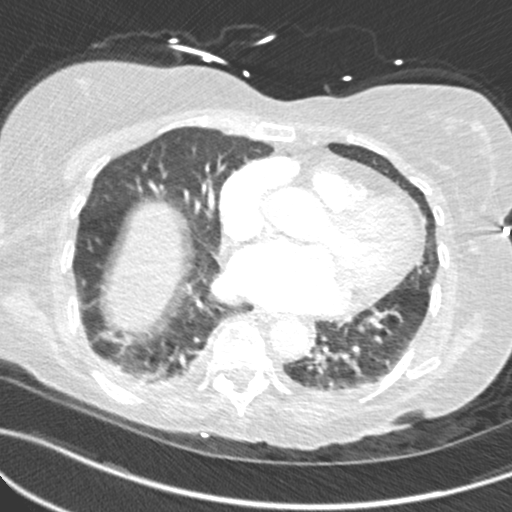
[im 87/209  lung]
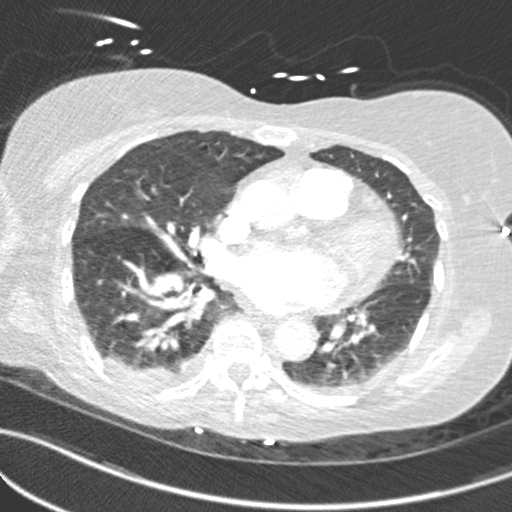
[im 122/209  lung]
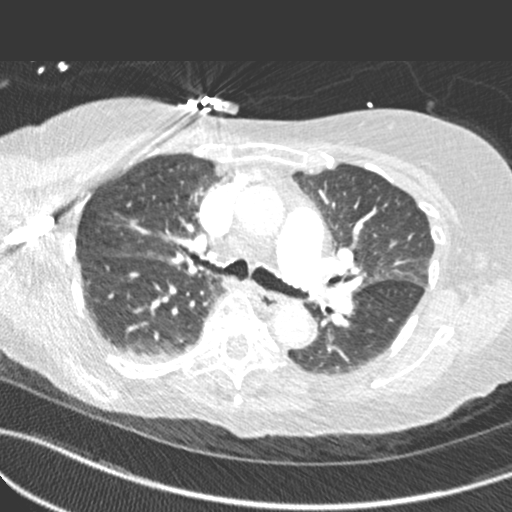
[im 139/209  lung]
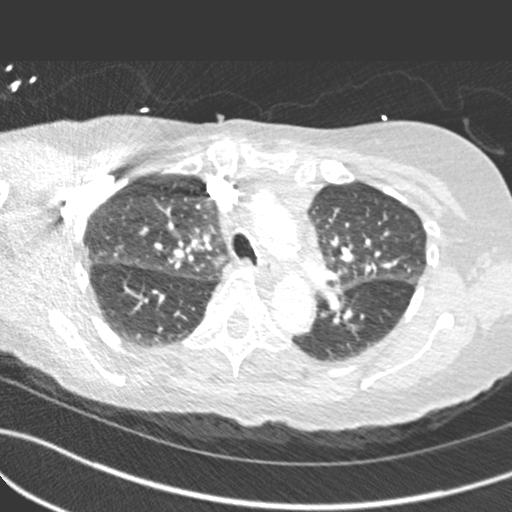
[im 157/209  lung]
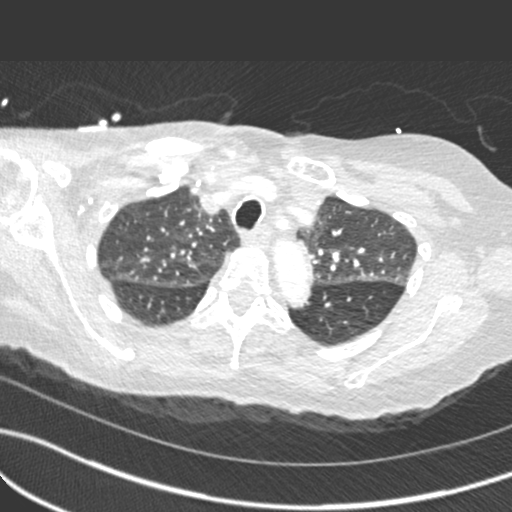
[im 191/209  lung]
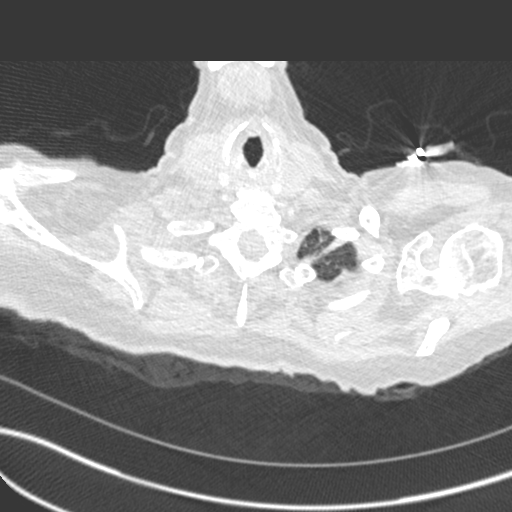

[Series 12: a/p w/ 5mm · axial · 0.69mm/px · z∈[+748,+968]mm · 3 of 89 slices shown]
[im 23/89  lung]
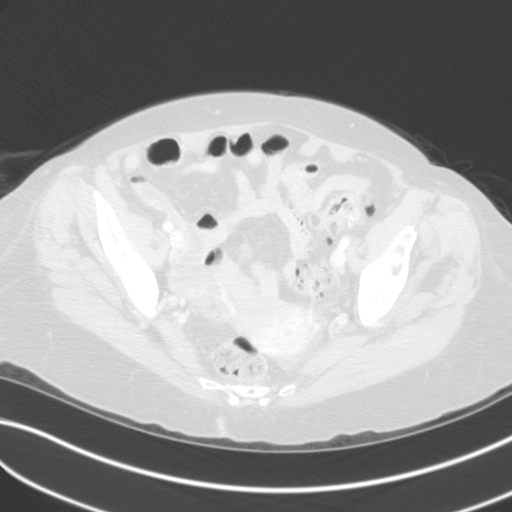
[im 45/89  mediastinal]
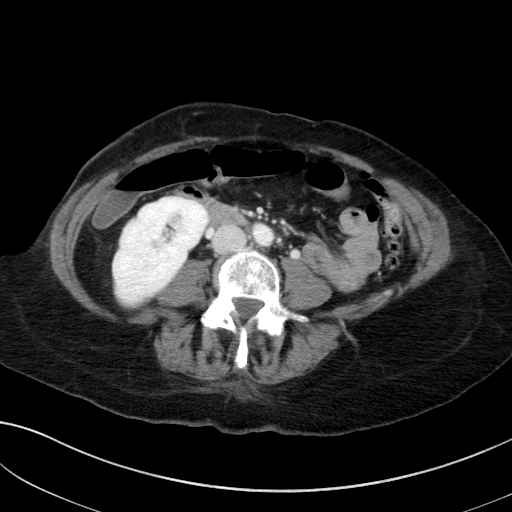
[im 67/89  lung]
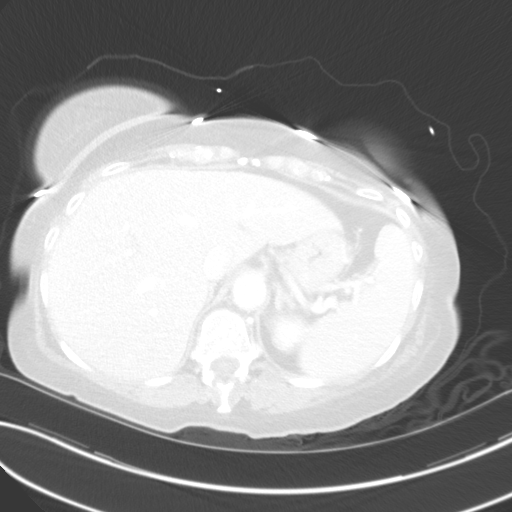

[16 of 37 positions shown; findings below may reference images not displayed]

IMPRESSION: Small indeterminate left adrenal mass. This finding may warrant a
follow-up study in 1 year to assess stability. If more aggressive
surveillance is felt to be warranted, MR pre and post-contrast of
the adrenals could be helpful for further assessment.
FINDINGS: CTA CHEST FINDINGS

Cardiovascular: There is no demonstrable pulmonary embolus. There is
no thoracic aortic aneurysm or dissection. Visualized great vessels
appear unremarkable. There are foci of calcification in the thoracic
aorta. There is no appreciable pericardial thickening. Minimal
pericardial fluid may be physiologic.

Mediastinum/Nodes: There is a mass in the right lobe of the thyroid
measuring 1.6 x 1.3 cm. There is a smaller mass in the left lobe of
the thyroid measuring 1.1 x 0.7 cm.

There are subcentimeter mediastinal lymph nodes. No adenopathy by
size criteria evident in the thoracic region. No esophageal lesions
are evident.

Lungs/Pleura: There is bibasilar atelectatic change. There is a
small right pleural effusion. There is no parenchymal lung edema or
consolidation.

Musculoskeletal: There is degenerative change in the thoracic spine.
There are no blastic or lytic bone lesions. No chest wall lesions
are evident.

Review of the MIP images confirms the above findings.

CT ABDOMEN and PELVIS FINDINGS

Hepatobiliary: No focal liver lesions are appreciable. Gallbladder
wall is not appreciably thickened. There is no biliary duct
dilatation.

Pancreas: No pancreatic mass or inflammatory focus evident.

Spleen: No splenic lesions are appreciable.

Adrenals/Urinary Tract: Right adrenal appears normal. There is a
x 0.9 cm nodular lesion in the left adrenal which must be regarded
as indeterminate based on attenuation criteria. There is a cyst
arising in the lateral mid left kidney measuring 1.6 x 1.4 cm. There
is a 7 x 5 mm cyst in the anterior inferior left kidney. There is no
hydronephrosis on either side. There is no renal or ureteral
calculus on either side. Urinary bladder is midline with wall
thickness within normal limits.

Stomach/Bowel: There is no appreciable bowel wall or mesenteric
thickening. There are scattered colonic diverticula without
diverticulitis. No evident bowel obstruction. No free air or portal
venous air.

Vascular/Lymphatic: There is atherosclerotic calcification in aorta.
No aneurysm. Major mesenteric vessels appear patent. No adenopathy
is evident in the abdomen or pelvis.

Reproductive: Uterus is anteverted. There is a mass either arising
from the uterus on the right somewhat inferiorly versus an
immediately adjacent right ovarian/adnexal mass. This lesion appears
solid and measures 3.2 x 2.7 x 2.7 cm. There is a presumed eccentric
leiomyoma arising from the uterus superiorly and anteriorly
measuring 1.6 x 1.5 x 1.5 cm. There is minimal fluid in the
cul-de-sac region.

Other: There is no periappendiceal region abnormality. Appendix not
convincingly seen. No abscess or ascites evident in the abdomen or
pelvis. There is a small ventral hernia containing only fat.

Musculoskeletal: There is degenerative change in the lumbar spine.
There is mild lower lumbar levoscoliosis. There are no blastic or
lytic bone lesions. No intramuscular or abdominal wall lesion
evident.

Review of the MIP images confirms the above findings.
IMPRESSION: CT angiogram chest:

1. No demonstrable pulmonary embolus. No thoracic aortic aneurysm or
dissection. There is aortic atherosclerosis.

2. Bibasilar atelectasis. No edema or consolidation. Small right
pleural effusion.

3.  No appreciable thoracic adenopathy.

4. **An incidental finding of potential clinical significance has
been found. Dominant mass right lobe thyroid. Consider further
evaluation with thyroid ultrasound. If patient is clinically
hyperthyroid, consider nuclear medicine thyroid uptake and scan.**

CT abdomen and pelvis:

1. Leiomyomatous uterus. Mass arising either from the right side of
the uterus or from the immediately adjacent to the ovary. Advise
pelvic ultrasound to further evaluate this solid mass measuring
x 2.7 x 2.7 cm, in particular to assess uterine versus right adnexal
origin.

2. Scattered colonic diverticula without diverticulitis. No bowel
obstruction. No abscess. No periappendiceal region inflammation.

3. No evident renal or ureteral calculus. No hydronephrosis
appreciable.

4.  Aortic atherosclerosis.

5.  Small ventral hernia containing only fat.

Aortic Atherosclerosis ([id number].A).

## 2019-12-11 DIAGNOSIS — C023 Malignant neoplasm of anterior two-thirds of tongue, part unspecified: Secondary | ICD-10-CM | POA: Diagnosis not present

## 2019-12-13 ENCOUNTER — Telehealth: Payer: Self-pay | Admitting: Podiatry

## 2019-12-13 NOTE — Telephone Encounter (Signed)
I saw Dr. Amalia Hailey on 8/9 and was told you would call to schedule sx, of course it won't be until October. If you would please call me back to get that scheduled. Thank you.

## 2019-12-13 NOTE — Telephone Encounter (Signed)
Called pt back in regards to her sx. Told her they had already scheduled her for sx on 10/21. Pt stated she would like to keep that date as its the date she requested. I asked the pt if she received a blue bag with a blue folder in it along with the ice pack and scrub brush and pt responded she had not. I told her I would leave it at the front desk for her to get at her convenience and that there are instructions in the blue folder on how to register online with [facility] [employer] for [place] or they could call and get the information over the phone if she could not register online. Also told her there are pre-op and post-op instructions in the blue folder as well. Pt stated she would be by tomorrow to get it. Told pt to call with any other questions and/or concerns she may have before sx. Also informed pt I could not give her a sx time, that someone would call a day or two prior to confirm her sx time and what time to arrive for pre-op as they may have schedule changes and pt's that are diabetic and/or pediatric that would need to go back first.

## 2019-12-14 DIAGNOSIS — K140 Glossitis: Secondary | ICD-10-CM | POA: Diagnosis not present

## 2019-12-14 DIAGNOSIS — M270 Developmental disorders of jaws: Secondary | ICD-10-CM | POA: Diagnosis not present

## 2019-12-14 DIAGNOSIS — K146 Glossodynia: Secondary | ICD-10-CM | POA: Diagnosis not present

## 2019-12-15 DIAGNOSIS — C023 Malignant neoplasm of anterior two-thirds of tongue, part unspecified: Secondary | ICD-10-CM | POA: Diagnosis not present

## 2019-12-27 DIAGNOSIS — C029 Malignant neoplasm of tongue, unspecified: Secondary | ICD-10-CM | POA: Diagnosis not present

## 2020-01-04 LAB — SURGICAL PATHOLOGY

## 2020-01-12 ENCOUNTER — Other Ambulatory Visit (HOSPITAL_COMMUNITY): Payer: Self-pay | Admitting: Otolaryngology

## 2020-01-12 ENCOUNTER — Other Ambulatory Visit: Payer: Self-pay | Admitting: Otolaryngology

## 2020-01-12 DIAGNOSIS — C029 Malignant neoplasm of tongue, unspecified: Secondary | ICD-10-CM

## 2020-01-17 ENCOUNTER — Telehealth: Payer: Self-pay

## 2020-01-17 NOTE — Telephone Encounter (Signed)
[place] called to cancel her surgery scheduled 02/15/2020 with Dr. Amalia Hailey. She stated she has cancer and will be needing a surgery for that first. She will call when she is ready to reschedule. Notified Dr. Amalia Hailey and Caren Griffins at [facility].

## 2020-01-19 ENCOUNTER — Other Ambulatory Visit: Payer: Self-pay

## 2020-01-19 ENCOUNTER — Ambulatory Visit (HOSPITAL_COMMUNITY)
Admission: RE | Admit: 2020-01-19 | Discharge: 2020-01-19 | Disposition: A | Discharge status: Home / Self Care | Payer: Medicare Other | Source: Ambulatory Visit | Attending: Otolaryngology | Admitting: Otolaryngology

## 2020-01-19 DIAGNOSIS — C029 Malignant neoplasm of tongue, unspecified: Secondary | ICD-10-CM | POA: Insufficient documentation

## 2020-01-19 DIAGNOSIS — Z85810 Personal history of malignant neoplasm of tongue: Secondary | ICD-10-CM | POA: Diagnosis not present

## 2020-01-19 DIAGNOSIS — E049 Nontoxic goiter, unspecified: Secondary | ICD-10-CM | POA: Diagnosis not present

## 2020-01-19 LAB — POCT I-STAT CREATININE: Creatinine, Ser: 0.8 mg/dL (ref 0.44–1.00)

## 2020-01-19 MED ORDER — IOHEXOL 300 MG/ML  SOLN
75.0000 mL | Freq: Once | INTRAMUSCULAR | Status: AC | PRN
  Administered 2020-01-19: 75 mL via INTRAVENOUS

## 2020-01-22 ENCOUNTER — Other Ambulatory Visit: Payer: Self-pay

## 2020-01-22 ENCOUNTER — Ambulatory Visit (HOSPITAL_COMMUNITY)
Admission: RE | Admit: 2020-01-22 | Discharge: 2020-01-22 | Disposition: A | Discharge status: Home / Self Care | Payer: Medicare Other | Source: Ambulatory Visit | Attending: Otolaryngology | Admitting: Otolaryngology

## 2020-01-22 DIAGNOSIS — R911 Solitary pulmonary nodule: Secondary | ICD-10-CM | POA: Diagnosis not present

## 2020-01-22 DIAGNOSIS — C029 Malignant neoplasm of tongue, unspecified: Secondary | ICD-10-CM | POA: Diagnosis not present

## 2020-01-22 DIAGNOSIS — C760 Malignant neoplasm of head, face and neck: Secondary | ICD-10-CM | POA: Diagnosis not present

## 2020-01-22 LAB — GLUCOSE, CAPILLARY: Glucose-Capillary: 109 mg/dL — ABNORMAL HIGH (ref 70–99)

## 2020-01-22 MED ORDER — FLUDEOXYGLUCOSE F - 18 (FDG) INJECTION
7.9000 | Freq: Once | INTRAVENOUS | Status: AC | PRN
  Administered 2020-01-22: 7.9 via INTRAVENOUS

## 2020-01-24 DIAGNOSIS — Z23 Encounter for immunization: Secondary | ICD-10-CM | POA: Diagnosis not present

## 2020-02-06 DIAGNOSIS — Z79899 Other long term (current) drug therapy: Secondary | ICD-10-CM | POA: Diagnosis not present

## 2020-02-06 DIAGNOSIS — E7800 Pure hypercholesterolemia, unspecified: Secondary | ICD-10-CM | POA: Diagnosis not present

## 2020-02-07 NOTE — H&P (Signed)
HPI:   Erika [name] is a 74 y.o. female who presents as a new Patient.   Referring Provider: Self, A Referral  Chief complaint: Tongue ulcer.  HPI: In May she developed a sore spot on her tongue posteriorly on the left side. It has progressed since then. She has developed left-sided ear pain intermittently. She has also developed a small bump in her left anterior submandibular region that seems to come and go. She has never smoked or drank. 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 She has extensive dental restorative work. She saw an oral surgeon and is scheduled for biopsy of the tongue tomorrow.  PMH/Meds/All/SocHx/FamHx/ROS:   History reviewed. No pertinent past medical history.  History reviewed. No pertinent surgical history.  No family history of bleeding disorders, wound healing problems or difficulty with anesthesia.   Social History   Socioeconomic History  . Marital status: Married  Spouse name: Not on file  . Number of children: Not on file  . Years of education: Not on file  . Highest education level: Not on file  Occupational History  . Not on file  Tobacco Use  . Smoking status: Never Smoker  . Smokeless tobacco: Never Used  Vaping Use  . Vaping Use: Never used  Substance and Sexual Activity  . Alcohol use: Not on file  . Drug use: Not on file  . Sexual activity: Not on file  Other Topics Concern  . Not on file  Social History Narrative  . Not on file   Social Determinants of Health   Financial Resource Strain:  . Difficulty of Paying Living Expenses:  Food Insecurity:  . Worried About Charity fundraiser in the Last Year:  . Arboriculturist in the Last Year:  Transportation Needs:  . Film/video editor (Medical):  Marland Kitchen Lack of Transportation (Non-Medical):  Physical Activity:  . Days of Exercise per Week:  . Minutes of Exercise per Session:  Stress:  . Feeling of Stress :  Social Connections:  . Frequency of  Communication with Friends and Family:  . Frequency of Social Gatherings with Friends and Family:  . Attends Religious Services:  . Active Member of Clubs or Organizations:  . Attends Archivist Meetings:  Marland Kitchen Marital Status:   Current Outpatient Medications:  . amLODIPine (NORVASC) 5 MG tablet, TAKE 1 TABLET BY MOUTH EVERY DAY, Disp: , Rfl:  . aspirin 81 MG EC tablet *ANTIPLATELET*, Take 81 mg by mouth., Disp: , Rfl:  . calcium carbonate-vitamin D3 (OSCAL-D) 500 mg(1,250mg ) -400 unit Tab tablet, Take by mouth., Disp: , Rfl:  . cetirizine (ZYRTEC) 10 MG tablet, Take 10 mg by mouth., Disp: , Rfl:  . famotidine (PEPCID) 20 MG tablet, Take 20 mg by mouth., Disp: , Rfl:  . fluticasone propionate (FLONASE) 50 mcg/actuation nasal spray, 2 sprays by Nasal route daily as needed., Disp: , Rfl:  . losartan (COZAAR) 100 MG tablet, Take 100 mg by mouth., Disp: , Rfl:  . metoPROLOL tartrate (LOPRESSOR) 25 MG tablet, TAKE 1 TABLET BY MOUTH TWICE DAILY WITH FOOD, Disp: , Rfl:  . nitrofurantoin (MACRODANTIN) 50 MG capsule, TAKE ONE CAPSULE BY MOUTH FOUR TIMES DAILY, Disp: , Rfl:  . nystatin (MYCOSTATIN) 100,000 unit/mL suspension, , Disp: , Rfl:  . omeprazole (PRILOSEC) 20 MG capsule, Take 80 mg by mouth., Disp: , Rfl:  . simvastatin (ZOCOR) 20 MG tablet, TAKE 1 TABLET BY MOUTH EVERY DAY IN THE EVENING, Disp: , Rfl:  A complete ROS was performed with pertinent positives/negatives noted in the HPI. The remainder of the ROS are negative.   Physical Exam:   BP 138/72  Pulse 67  Temp (!) 94.6 F (34.8 C)  Ht 1.651 m (5\' 5" )  Wt 68.5 kg (151 lb)  BMI 25.13 kg/m   General: Healthy and alert, in no distress, breathing easily. Normal affect. In a pleasant mood. Head: Normocephalic, atraumatic. No masses, or scars. Eyes: Pupils are equal, and reactive to light. Vision is grossly intact. No spontaneous or gaze nystagmus. Ears: Ear canals are clear. Tympanic membranes are intact, with normal  landmarks and the middle ears are clear and healthy. Hearing: Grossly normal. Nose: Nasal cavities are clear with healthy mucosa, no polyps or exudate. Airways are patent. Face: No masses or scars, facial nerve function is symmetric. Oral Cavity: There is an oval-shaped very superficial ulceration along the left posterior lateral oral tongue along the posterior floor of mouth. It is not palpable. It is a little bit tender. There are no other lesions identified. Tongue with normal mobility. Dentition with extensive restorative work. Bilateral anterior mandibular tori are present, much larger on the left. Oropharynx: There are no mucosal masses identified. Tongue base appears normal and healthy. Larynx/Hypopharynx: deferred Chest: Deferred Neck: No palpable masses, no cervical adenopathy, no thyroid nodules or enlargement. Neuro: Cranial nerves II-XII with normal function. Balance: Normal gate. Other findings: none.  Independent Review of Additional Tests or Records:  none  Procedures:  none  Impression & Plans:  Superficial ulceration along the posterior left oral tongue. Since there is nothing palpable and the ulceration is extremely superficial I doubt this is going to be carcinoma. I suspect its related to rubbing against her mandibular molars. I wonder if perhaps the large torus on the left is changing the positioning of the tongue and causing rubbing along the tooth. If the biopsy is benign I would recommend possibility of removing the left torus and perhaps filing down the molars to smooth them out. If there is evidence of carcinoma then we will discuss treatment of that.

## 2020-02-08 DIAGNOSIS — Z0001 Encounter for general adult medical examination with abnormal findings: Secondary | ICD-10-CM | POA: Diagnosis not present

## 2020-02-08 DIAGNOSIS — Z23 Encounter for immunization: Secondary | ICD-10-CM | POA: Diagnosis not present

## 2020-02-08 DIAGNOSIS — E7800 Pure hypercholesterolemia, unspecified: Secondary | ICD-10-CM | POA: Diagnosis not present

## 2020-02-08 DIAGNOSIS — I10 Essential (primary) hypertension: Secondary | ICD-10-CM | POA: Diagnosis not present

## 2020-02-08 DIAGNOSIS — E041 Nontoxic single thyroid nodule: Secondary | ICD-10-CM | POA: Diagnosis not present

## 2020-02-08 DIAGNOSIS — C049 Malignant neoplasm of floor of mouth, unspecified: Secondary | ICD-10-CM | POA: Diagnosis not present

## 2020-02-12 NOTE — Progress Notes (Signed)
[facility] [place], [place] AT [facility] OF [address] [place] [address] Phone: [phone] Fax: [phone]      Your procedure is scheduled on Friday 02/16/2020.  Report to [facility] Main Entrance "A" at 05:30 A.M., and check in at the Admitting office.  Call this number if you have problems, questions, or concerns between now and the morning of surgery:  [phone]    Remember:  Do not eat or drink after midnight the night before your surgery     Take these medicines the morning of surgery with A SIP OF WATER: Certirizine (Zyrtec) Esomeprazole (Nexium) Fluticasone (Flonase) Metoprolol tartrate (Lopressor)   As of today, STOP taking any Aspirin (unless otherwise instructed by your surgeon) or aspirin-containing products, Aleve, Naproxen, Ibuprofen, Motrin, Advil, Goody's, BC's, all herbal medications, fish oil, and all vitamins and supplements.  Follow your surgeon's instructions on when to stop Aspirin.  If no instructions were given by your surgeon then you will need to call the office to get those instructions.                        Do not wear jewelry, make up, or nail polish            Do not wear lotions, powders, perfumes, or deodorant.            Do not shave 48 hours prior to surgery.  Men may shave face and neck.            Do not bring valuables to the hospital.            [facility] is not responsible for any belongings or valuables.  Do NOT Smoke (Tobacco/Vaping) or drink Alcohol 24 hours prior to your procedure  If you use a CPAP at night, you may bring all equipment for your overnight stay.   Contacts, glasses, dentures or bridgework may not be worn into surgery.      For patients admitted to the hospital, discharge time will be determined by your treatment team.   Patients discharged the day of surgery will not be allowed to drive home, and someone needs to stay with them for 24  hours.    Special instructions:   [facility]- Preparing For Surgery  Before surgery, you can play an important role. Because skin is not sterile, your skin needs to be as free of germs as possible. You can reduce the number of germs on your skin by washing with CHG (chlorahexidine gluconate) Soap before surgery.  CHG is an antiseptic cleaner which kills germs and bonds with the skin to continue killing germs even after washing.    Oral Hygiene is also important to reduce your risk of infection.  Remember - BRUSH YOUR TEETH THE MORNING OF SURGERY WITH YOUR REGULAR TOOTHPASTE  Please do not use if you have an allergy to CHG or antibacterial soaps. If your skin becomes reddened/irritated stop using the CHG.  Do not shave (including legs and underarms) for at least 48 hours prior to first CHG shower. It is OK to shave your face.  Please follow these instructions carefully.   1. Shower the NIGHT BEFORE SURGERY and the MORNING OF SURGERY with CHG Soap.   2. If you chose to wash your hair, wash your hair first as usual with your normal shampoo.  3. After you shampoo, rinse your hair and  body thoroughly to remove the shampoo.  4. Use CHG as you would any other liquid soap. You can apply CHG directly to the skin and wash gently with a scrungie or a clean washcloth.   5. Apply the CHG Soap to your body ONLY FROM THE NECK DOWN.  Do not use on open wounds or open sores. Avoid contact with your eyes, ears, mouth and genitals (private parts). Wash Face and genitals (private parts)  with your normal soap.   6. Wash thoroughly, paying special attention to the area where your surgery will be performed.  7. Thoroughly rinse your body with warm water from the neck down.  8. DO NOT shower/wash with your normal soap after using and rinsing off the CHG Soap.  9. Pat yourself dry with a CLEAN TOWEL.  10. Wear CLEAN PAJAMAS to bed the night before surgery  11. Place CLEAN SHEETS on your bed the night of  your first shower and DO NOT SLEEP WITH PETS.   Day of Surgery: Shower with CHG soap as directed Wear Clean/Comfortable clothing the morning of surgery Do not apply any deodorants/lotions.   Remember to brush your teeth WITH YOUR REGULAR TOOTHPASTE.   Please read over the following fact sheets that you were given.

## 2020-02-13 ENCOUNTER — Other Ambulatory Visit (HOSPITAL_COMMUNITY)
Admission: RE | Admit: 2020-02-13 | Discharge: 2020-02-13 | Disposition: A | Discharge status: Home / Self Care | Payer: Medicare Other | Source: Ambulatory Visit | Attending: Otolaryngology | Admitting: Otolaryngology

## 2020-02-13 ENCOUNTER — Encounter (HOSPITAL_COMMUNITY)
Admission: RE | Admit: 2020-02-13 | Discharge: 2020-02-13 | Disposition: A | Discharge status: Home / Self Care | Payer: Medicare Other | Source: Ambulatory Visit | Attending: Otolaryngology | Admitting: Otolaryngology

## 2020-02-13 ENCOUNTER — Encounter (HOSPITAL_COMMUNITY): Payer: Self-pay

## 2020-02-13 ENCOUNTER — Other Ambulatory Visit: Payer: Self-pay

## 2020-02-13 DIAGNOSIS — I10 Essential (primary) hypertension: Secondary | ICD-10-CM | POA: Insufficient documentation

## 2020-02-13 DIAGNOSIS — Z01818 Encounter for other preprocedural examination: Secondary | ICD-10-CM | POA: Insufficient documentation

## 2020-02-13 DIAGNOSIS — R112 Nausea with vomiting, unspecified: Secondary | ICD-10-CM | POA: Diagnosis not present

## 2020-02-13 DIAGNOSIS — Z20822 Contact with and (suspected) exposure to covid-19: Secondary | ICD-10-CM | POA: Insufficient documentation

## 2020-02-13 DIAGNOSIS — Z91018 Allergy to other foods: Secondary | ICD-10-CM | POA: Diagnosis not present

## 2020-02-13 DIAGNOSIS — Z79899 Other long term (current) drug therapy: Secondary | ICD-10-CM | POA: Diagnosis not present

## 2020-02-13 DIAGNOSIS — Z01812 Encounter for preprocedural laboratory examination: Secondary | ICD-10-CM | POA: Insufficient documentation

## 2020-02-13 DIAGNOSIS — Z7982 Long term (current) use of aspirin: Secondary | ICD-10-CM | POA: Diagnosis not present

## 2020-02-13 DIAGNOSIS — Z888 Allergy status to other drugs, medicaments and biological substances status: Secondary | ICD-10-CM | POA: Diagnosis not present

## 2020-02-13 DIAGNOSIS — C029 Malignant neoplasm of tongue, unspecified: Secondary | ICD-10-CM | POA: Diagnosis not present

## 2020-02-13 DIAGNOSIS — Z885 Allergy status to narcotic agent status: Secondary | ICD-10-CM | POA: Diagnosis not present

## 2020-02-13 HISTORY — DX: Nausea with vomiting, unspecified: R11.2

## 2020-02-13 HISTORY — DX: Cardiac arrhythmia, unspecified: I49.9

## 2020-02-13 HISTORY — DX: Glossitis: K14.0

## 2020-02-13 HISTORY — DX: Headache, unspecified: R51.9

## 2020-02-13 HISTORY — DX: Other specified postprocedural states: Z98.890

## 2020-02-13 LAB — CBC
HCT: 38.1 % (ref 36.0–46.0)
Hemoglobin: 12.4 g/dL (ref 12.0–15.0)
MCH: 30.3 pg (ref 26.0–34.0)
MCHC: 32.5 g/dL (ref 30.0–36.0)
MCV: 93.2 fL (ref 80.0–100.0)
Platelets: 292 10*3/uL (ref 150–400)
RBC: 4.09 MIL/uL (ref 3.87–5.11)
RDW: 12 % (ref 11.5–15.5)
WBC: 4.9 10*3/uL (ref 4.0–10.5)
nRBC: 0 % (ref 0.0–0.2)

## 2020-02-13 LAB — BASIC METABOLIC PANEL
Anion gap: 11 (ref 5–15)
BUN: 21 mg/dL (ref 8–23)
CO2: 26 mmol/L (ref 22–32)
Calcium: 9.7 mg/dL (ref 8.9–10.3)
Chloride: 99 mmol/L (ref 98–111)
Creatinine, Ser: 0.86 mg/dL (ref 0.44–1.00)
GFR, Estimated: >60 mL/min (ref >60)
Glucose, Bld: 107 mg/dL — ABNORMAL HIGH (ref 70–99)
Potassium: 4.3 mmol/L (ref 3.5–5.1)
Sodium: 136 mmol/L (ref 135–145)

## 2020-02-13 LAB — SARS CORONAVIRUS 2 (TAT 6-24 HRS): SARS Coronavirus 2: NEGATIVE

## 2020-02-13 NOTE — Progress Notes (Signed)
PCP - DR [place]  Cardiologist - NA    Chest x-ray - NA EKG - 02/13/20 Stress Test - NA ECHO - 8/19 Cardiac Cath - NA    Blood Thinner Instructions:NA Aspirin Instructions:STOP  COVID TEST- FOR TODAY   Anesthesia review: HX HTN  Patient denies shortness of breath, fever, cough and chest pain at PAT appointment   All instructions explained to the patient, with a verbal understanding of the material. Patient agrees to go over the instructions while at home for a better understanding. Patient also instructed to self quarantine after being tested for COVID-19. The opportunity to ask questions was provided.

## 2020-02-14 NOTE — Progress Notes (Signed)
Anesthesia Chart Review:  Previous cardiac evaluation for stress-induced atrial fibrillation.  Last seen by Dr. Daneen Schick 05/11/2018.  Per note she had a history of sepsis stress related paroxysmal atrial fibrillation in June 2019.  She subsequently completed a 30-day event monitor with no evidence of atrial fibrillation.  Coronary CTA demonstrated widely patent coronaries.  LV function was normal by echo.  Due to no recurrence of A. fib and no structural heart disease and CHA2DS2-VASc score of 1, anticoagulation therapy was discontinued.  History of SIADH, review of labs shows frequent sodium readings in the 120s.  Recently sodium has been WNL, 136 on preop labs.  Remainder of preop labs also unremarkable.  EKG 02/13/2020: Sinus bradycardia.  Rate 55.  CT coronary morphology 01/27/2018: IMPRESSION: 1. Coronary calcium score of 0. This was 0 percentile for age and sex matched control.  2. Normal coronary origin with right dominance.  3. No evidence of CAD.  30-day event monitor 01/12/2018:  Normal sinus rhythm  No significant arrhythmia  No events or complaints.   Normal study  TTE 12/21/2017: - Left ventricle: The cavity size was normal. Systolic function was  normal. The estimated ejection fraction was in the range of 60%  to 65%. Wall motion was normal; there were no regional wall  motion abnormalities. Features are consistent with a pseudonormal  left ventricular filling pattern, with concomitant abnormal  relaxation and increased filling pressure (grade 2 diastolic  dysfunction). Doppler parameters are consistent with elevated  ventricular end-diastolic filling pressure.  - Mitral valve: Systolic bowing without prolapse. There was mild  regurgitation directed centrally.  - Left atrium: The atrium was mildly to moderately dilated.    Erika [name] [facility]/Anesthesiology Phone [phone] 02/14/2020 12:05 PM

## 2020-02-14 NOTE — Anesthesia Preprocedure Evaluation (Addendum)
Anesthesia Evaluation  Patient identified by MRN, date of birth, ID band Patient awake    Reviewed: Allergy & Precautions, NPO status , Patient's Chart, lab work & pertinent test results  History of Anesthesia Complications (+) PONV  Airway Mallampati: II  TM Distance: >3 FB Neck ROM: Full    Dental no notable dental hx.    Pulmonary neg pulmonary ROS,    Pulmonary exam normal breath sounds clear to auscultation       Cardiovascular hypertension, Normal cardiovascular exam+ dysrhythmias Atrial Fibrillation  Rhythm:Regular Rate:Normal     Neuro/Psych negative neurological ROS  negative psych ROS   GI/Hepatic negative GI ROS, Neg liver ROS,   Endo/Other  negative endocrine ROS  Renal/GU negative Renal ROS  negative genitourinary   Musculoskeletal negative musculoskeletal ROS (+)   Abdominal   Peds negative pediatric ROS (+)  Hematology negative hematology ROS (+)   Anesthesia Other Findings   Reproductive/Obstetrics negative OB ROS                            Anesthesia Physical Anesthesia Plan  ASA: III  Anesthesia Plan: General   Post-op Pain Management:    Induction: Intravenous  PONV Risk Score and Plan: 4 or greater and Ondansetron, Dexamethasone, Droperidol and Treatment may vary due to age or medical condition  Airway Management Planned: Oral ETT  Additional Equipment:   Intra-op Plan:   Post-operative Plan: Extubation in OR  Informed Consent: I have reviewed the patients History and Physical, chart, labs and discussed the procedure including the risks, benefits and alternatives for the proposed anesthesia with the patient or authorized representative who has indicated his/her understanding and acceptance.     Dental advisory given  Plan Discussed with: CRNA and Surgeon  Anesthesia Plan Comments: (PAT note by Karoline Caldwell, PA-C: Previous cardiac evaluation for  stress-induced atrial fibrillation.  Last seen by Dr. Daneen Schick 05/11/2018.  Per note she had a history of sepsis stress related paroxysmal atrial fibrillation in June 2019.  She subsequently completed a 30-day event monitor with no evidence of atrial fibrillation.  Coronary CTA demonstrated widely patent coronaries.  LV function was normal by echo.  Due to no recurrence of A. fib and no structural heart disease and CHA2DS2-VASc score of 1, anticoagulation therapy was discontinued.  History of SIADH, review of labs shows frequent sodium readings in the 120s.  Recently sodium has been WNL, 136 on preop labs.  Remainder of preop labs also unremarkable.  EKG 02/13/2020: Sinus bradycardia.  Rate 55.  CT coronary morphology 01/27/2018: IMPRESSION: 1. Coronary calcium score of 0. This was 0 percentile for age and sex matched control.  2. Normal coronary origin with right dominance.  3. No evidence of CAD.  30-day event monitor 01/12/2018: Normal sinus rhythm No significant arrhythmia No events or complaints.   Normal study  TTE 12/21/2017: - Left ventricle: The cavity size was normal. Systolic function was  normal. The estimated ejection fraction was in the range of 60%  to 65%. Wall motion was normal; there were no regional wall  motion abnormalities. Features are consistent with a pseudonormal  left ventricular filling pattern, with concomitant abnormal  relaxation and increased filling pressure (grade 2 diastolic  dysfunction). Doppler parameters are consistent with elevated  ventricular end-diastolic filling pressure.  - Mitral valve: Systolic bowing without prolapse. There was mild  regurgitation directed centrally.  - Left atrium: The atrium was mildly to moderately dilated.  )  Anesthesia Quick Evaluation  

## 2020-02-16 ENCOUNTER — Encounter (HOSPITAL_COMMUNITY): Payer: Self-pay | Admitting: Otolaryngology

## 2020-02-16 ENCOUNTER — Other Ambulatory Visit: Payer: Self-pay

## 2020-02-16 ENCOUNTER — Inpatient Hospital Stay (HOSPITAL_COMMUNITY): Payer: Medicare Other | Admitting: Anesthesiology

## 2020-02-16 ENCOUNTER — Encounter (HOSPITAL_COMMUNITY)
Admission: RE | Disposition: A | Discharge status: Home / Self Care | Payer: Self-pay | Source: Home / Self Care | Attending: Otolaryngology

## 2020-02-16 ENCOUNTER — Inpatient Hospital Stay (HOSPITAL_COMMUNITY)
Admission: RE | Admit: 2020-02-16 | Discharge: 2020-02-18 | DRG: 142 | Disposition: A | Discharge status: Home / Self Care | Payer: Medicare Other | Attending: Otolaryngology | Admitting: Otolaryngology

## 2020-02-16 ENCOUNTER — Inpatient Hospital Stay (HOSPITAL_COMMUNITY): Payer: Medicare Other | Admitting: Physician Assistant

## 2020-02-16 ENCOUNTER — Inpatient Hospital Stay (HOSPITAL_COMMUNITY): Payer: Medicare Other

## 2020-02-16 DIAGNOSIS — Z885 Allergy status to narcotic agent status: Secondary | ICD-10-CM

## 2020-02-16 DIAGNOSIS — I480 Paroxysmal atrial fibrillation: Secondary | ICD-10-CM | POA: Diagnosis not present

## 2020-02-16 DIAGNOSIS — C021 Malignant neoplasm of border of tongue: Secondary | ICD-10-CM | POA: Diagnosis present

## 2020-02-16 DIAGNOSIS — C029 Malignant neoplasm of tongue, unspecified: Secondary | ICD-10-CM | POA: Diagnosis present

## 2020-02-16 DIAGNOSIS — R112 Nausea with vomiting, unspecified: Secondary | ICD-10-CM | POA: Diagnosis not present

## 2020-02-16 DIAGNOSIS — Z7982 Long term (current) use of aspirin: Secondary | ICD-10-CM

## 2020-02-16 DIAGNOSIS — Z79899 Other long term (current) drug therapy: Secondary | ICD-10-CM | POA: Diagnosis not present

## 2020-02-16 DIAGNOSIS — Z888 Allergy status to other drugs, medicaments and biological substances status: Secondary | ICD-10-CM | POA: Diagnosis not present

## 2020-02-16 DIAGNOSIS — Z91018 Allergy to other foods: Secondary | ICD-10-CM

## 2020-02-16 DIAGNOSIS — Z20822 Contact with and (suspected) exposure to covid-19: Secondary | ICD-10-CM | POA: Diagnosis present

## 2020-02-16 DIAGNOSIS — E785 Hyperlipidemia, unspecified: Secondary | ICD-10-CM | POA: Diagnosis not present

## 2020-02-16 DIAGNOSIS — I10 Essential (primary) hypertension: Secondary | ICD-10-CM | POA: Diagnosis not present

## 2020-02-16 DIAGNOSIS — K140 Glossitis: Secondary | ICD-10-CM | POA: Diagnosis present

## 2020-02-16 DIAGNOSIS — Z4682 Encounter for fitting and adjustment of non-vascular catheter: Secondary | ICD-10-CM | POA: Diagnosis not present

## 2020-02-16 HISTORY — PX: EXCISION OF TONGUE LESION: SHX6434

## 2020-02-16 HISTORY — PX: RADICAL NECK DISSECTION: SHX2284

## 2020-02-16 HISTORY — PX: PARTIAL GLOSSECTOMY: SHX2173

## 2020-02-16 SURGERY — EXCISION, LESION, TONGUE
Anesthesia: General | Site: Neck

## 2020-02-16 MED ORDER — HYDROCODONE-ACETAMINOPHEN 7.5-325 MG/15ML PO SOLN
10.0000 mL | ORAL | Status: DC | PRN
  Administered 2020-02-16 – 2020-02-18 (×6): 15 mL via ORAL
  Filled 2020-02-16 (×6): qty 15

## 2020-02-16 MED ORDER — CLINDAMYCIN PHOSPHATE 600 MG/50ML IV SOLN
600.0000 mg | Freq: Three times a day (TID) | INTRAVENOUS | Status: AC
  Administered 2020-02-16 – 2020-02-17 (×3): 600 mg via INTRAVENOUS
  Filled 2020-02-16 (×3): qty 50

## 2020-02-16 MED ORDER — PROPOFOL 500 MG/50ML IV EMUL
INTRAVENOUS | Status: DC | PRN
  Administered 2020-02-16: 50 ug/kg/min via INTRAVENOUS

## 2020-02-16 MED ORDER — ORAL CARE MOUTH RINSE: 15.0000 mL | Freq: Once | OROMUCOSAL | Status: AC

## 2020-02-16 MED ORDER — HYDROMORPHONE HCL 1 MG/ML IJ SOLN
INTRAMUSCULAR | Status: AC
  Administered 2020-02-16: 0.5 mg via INTRAVENOUS
  Filled 2020-02-16: qty 1

## 2020-02-16 MED ORDER — CHLORHEXIDINE GLUCONATE 0.12 % MT SOLN: 15.0000 mL | Freq: Once | OROMUCOSAL | Status: AC

## 2020-02-16 MED ORDER — NITROFURANTOIN MACROCRYSTAL 50 MG PO CAPS
50.0000 mg | ORAL_CAPSULE | Freq: Every day | ORAL | Status: DC
  Administered 2020-02-16 – 2020-02-17 (×2): 50 mg via ORAL
  Filled 2020-02-16 (×3): qty 1

## 2020-02-16 MED ORDER — SIMVASTATIN 20 MG PO TABS
20.0000 mg | ORAL_TABLET | Freq: Every day | ORAL | Status: DC
  Administered 2020-02-16 – 2020-02-17 (×2): 20 mg via ORAL
  Filled 2020-02-16 (×2): qty 1

## 2020-02-16 MED ORDER — PROMETHAZINE HCL 25 MG/ML IJ SOLN
12.5000 mg | Freq: Four times a day (QID) | INTRAMUSCULAR | Status: DC | PRN
  Administered 2020-02-16 – 2020-02-18 (×4): 12.5 mg via INTRAVENOUS
  Filled 2020-02-16 (×4): qty 1

## 2020-02-16 MED ORDER — ADULT MULTIVITAMIN W/MINERALS CH
1.0000 | ORAL_TABLET | Freq: Every day | ORAL | Status: DC
  Administered 2020-02-17 – 2020-02-18 (×2): 1 via ORAL
  Filled 2020-02-16 (×2): qty 1

## 2020-02-16 MED ORDER — PROMETHAZINE HCL 25 MG RE SUPP
25.0000 mg | Freq: Four times a day (QID) | RECTAL | Status: DC | PRN
  Filled 2020-02-16: qty 1

## 2020-02-16 MED ORDER — HYDROMORPHONE HCL 1 MG/ML IJ SOLN
INTRAMUSCULAR | Status: DC | PRN
Start: 2020-02-16 — End: 2020-02-16
  Administered 2020-02-16 (×2): .5 mg via INTRAVENOUS

## 2020-02-16 MED ORDER — MIDAZOLAM HCL 2 MG/2ML IJ SOLN
INTRAMUSCULAR | Status: AC
  Filled 2020-02-16: qty 2

## 2020-02-16 MED ORDER — ASPIRIN EC 81 MG PO TBEC
81.0000 mg | DELAYED_RELEASE_TABLET | Freq: Every day | ORAL | Status: DC
  Administered 2020-02-17 – 2020-02-18 (×2): 81 mg via ORAL
  Filled 2020-02-16 (×2): qty 1

## 2020-02-16 MED ORDER — LOSARTAN POTASSIUM 50 MG PO TABS
100.0000 mg | ORAL_TABLET | Freq: Every day | ORAL | Status: DC
  Administered 2020-02-16 – 2020-02-18 (×3): 100 mg via ORAL
  Filled 2020-02-16 (×3): qty 2

## 2020-02-16 MED ORDER — CLINDAMYCIN PHOSPHATE 900 MG/50ML IV SOLN
INTRAVENOUS | Status: AC
  Filled 2020-02-16: qty 50

## 2020-02-16 MED ORDER — MIDAZOLAM HCL 5 MG/5ML IJ SOLN
INTRAMUSCULAR | Status: DC | PRN
  Administered 2020-02-16: 2 mg via INTRAVENOUS

## 2020-02-16 MED ORDER — BACITRACIN ZINC 500 UNIT/GM EX OINT
TOPICAL_OINTMENT | CUTANEOUS | Status: DC | PRN
  Administered 2020-02-16: 1 via TOPICAL

## 2020-02-16 MED ORDER — CALCIUM CARBONATE-VITAMIN D 500-200 MG-UNIT PO TABS
1.0000 | ORAL_TABLET | Freq: Every day | ORAL | Status: DC
  Administered 2020-02-16 – 2020-02-18 (×3): 1 via ORAL
  Filled 2020-02-16 (×3): qty 1

## 2020-02-16 MED ORDER — DEXAMETHASONE SODIUM PHOSPHATE 10 MG/ML IJ SOLN
INTRAMUSCULAR | Status: DC | PRN
  Administered 2020-02-16: 10 mg via INTRAVENOUS

## 2020-02-16 MED ORDER — FAMOTIDINE 20 MG PO TABS
20.0000 mg | ORAL_TABLET | Freq: Every day | ORAL | Status: DC
  Administered 2020-02-16 – 2020-02-17 (×2): 20 mg via ORAL
  Filled 2020-02-16 (×2): qty 1

## 2020-02-16 MED ORDER — FENTANYL CITRATE (PF) 250 MCG/5ML IJ SOLN
INTRAMUSCULAR | Status: DC | PRN
Start: 2020-02-16 — End: 2020-02-16
  Administered 2020-02-16 (×5): 50 ug via INTRAVENOUS

## 2020-02-16 MED ORDER — CHLORHEXIDINE GLUCONATE 0.12 % MT SOLN
OROMUCOSAL | Status: AC
  Administered 2020-02-16: 15 mL via OROMUCOSAL
  Filled 2020-02-16: qty 15

## 2020-02-16 MED ORDER — ROCURONIUM BROMIDE 10 MG/ML (PF) SYRINGE
PREFILLED_SYRINGE | INTRAVENOUS | Status: DC | PRN
  Administered 2020-02-16: 70 mg via INTRAVENOUS
  Administered 2020-02-16: 30 mg via INTRAVENOUS

## 2020-02-16 MED ORDER — MORPHINE SULFATE (PF) 2 MG/ML IV SOLN
2.0000 mg | INTRAVENOUS | Status: DC | PRN
  Administered 2020-02-16 – 2020-02-17 (×3): 2 mg via INTRAVENOUS
  Filled 2020-02-16 (×3): qty 1

## 2020-02-16 MED ORDER — CLINDAMYCIN PHOSPHATE 900 MG/50ML IV SOLN
900.0000 mg | Freq: Once | INTRAVENOUS | Status: AC
  Administered 2020-02-16: 900 mg via INTRAVENOUS

## 2020-02-16 MED ORDER — 0.9 % SODIUM CHLORIDE (POUR BTL) OPTIME
TOPICAL | Status: DC | PRN
  Administered 2020-02-16: 1000 mL

## 2020-02-16 MED ORDER — DEXTROSE-NACL 5-0.9 % IV SOLN: INTRAVENOUS | Status: DC

## 2020-02-16 MED ORDER — ONDANSETRON HCL 4 MG/2ML IJ SOLN
INTRAMUSCULAR | Status: AC
  Filled 2020-02-16: qty 2

## 2020-02-16 MED ORDER — LACTATED RINGERS IV SOLN: INTRAVENOUS | Status: DC | PRN

## 2020-02-16 MED ORDER — PROMETHAZINE HCL 25 MG PO TABS
25.0000 mg | ORAL_TABLET | Freq: Four times a day (QID) | ORAL | Status: DC | PRN
  Administered 2020-02-16: 25 mg via ORAL
  Filled 2020-02-16 (×2): qty 1

## 2020-02-16 MED ORDER — FENTANYL CITRATE (PF) 250 MCG/5ML IJ SOLN
INTRAMUSCULAR | Status: AC
  Filled 2020-02-16: qty 5

## 2020-02-16 MED ORDER — HYDROCODONE-ACETAMINOPHEN 7.5-325 MG/15ML PO SOLN
ORAL | Status: AC
  Filled 2020-02-16: qty 15

## 2020-02-16 MED ORDER — HYDROMORPHONE HCL 1 MG/ML IJ SOLN
INTRAMUSCULAR | Status: AC
  Filled 2020-02-16: qty 0.5

## 2020-02-16 MED ORDER — HYDROMORPHONE HCL 1 MG/ML IJ SOLN
INTRAMUSCULAR | Status: AC
Start: 2020-02-16 — End: ?
  Filled 2020-02-16: qty 0.5

## 2020-02-16 MED ORDER — TROLAMINE SALICYLATE 10 % EX CREA
1.0000 "application " | TOPICAL_CREAM | CUTANEOUS | Status: DC | PRN
  Filled 2020-02-16: qty 85

## 2020-02-16 MED ORDER — ONDANSETRON HCL 4 MG/2ML IJ SOLN
INTRAMUSCULAR | Status: DC | PRN
  Administered 2020-02-16: 4 mg via INTRAVENOUS

## 2020-02-16 MED ORDER — PANTOPRAZOLE SODIUM 40 MG PO TBEC
40.0000 mg | DELAYED_RELEASE_TABLET | Freq: Every day | ORAL | Status: DC
  Administered 2020-02-17 – 2020-02-18 (×2): 40 mg via ORAL
  Filled 2020-02-16 (×2): qty 1

## 2020-02-16 MED ORDER — ONDANSETRON HCL 4 MG/2ML IJ SOLN
4.0000 mg | Freq: Once | INTRAMUSCULAR | Status: AC
  Administered 2020-02-16: 4 mg via INTRAVENOUS

## 2020-02-16 MED ORDER — METOPROLOL TARTRATE 25 MG PO TABS
25.0000 mg | ORAL_TABLET | Freq: Two times a day (BID) | ORAL | Status: DC
  Administered 2020-02-16 – 2020-02-18 (×4): 25 mg via ORAL
  Filled 2020-02-16 (×4): qty 1

## 2020-02-16 MED ORDER — DICLOFENAC SODIUM 1 % EX GEL: 1.0000 "application " | Freq: Four times a day (QID) | CUTANEOUS | Status: DC | PRN

## 2020-02-16 MED ORDER — HYDROMORPHONE HCL 1 MG/ML IJ SOLN
0.2500 mg | INTRAMUSCULAR | Status: DC | PRN
  Administered 2020-02-16: 0.5 mg via INTRAVENOUS

## 2020-02-16 MED ORDER — PROPOFOL 10 MG/ML IV BOLUS
INTRAVENOUS | Status: AC
  Filled 2020-02-16: qty 20

## 2020-02-16 MED ORDER — LACTATED RINGERS IV SOLN: INTRAVENOUS | Status: DC

## 2020-02-16 MED ORDER — SUGAMMADEX SODIUM 200 MG/2ML IV SOLN
INTRAVENOUS | Status: DC | PRN
  Administered 2020-02-16: 150 mg via INTRAVENOUS

## 2020-02-16 MED ORDER — LIDOCAINE 2% (20 MG/ML) 5 ML SYRINGE
INTRAMUSCULAR | Status: DC | PRN
  Administered 2020-02-16: 60 mg via INTRAVENOUS

## 2020-02-16 MED ORDER — PROPOFOL 10 MG/ML IV BOLUS
INTRAVENOUS | Status: DC | PRN
  Administered 2020-02-16: 100 mg via INTRAVENOUS

## 2020-02-16 MED ORDER — AMLODIPINE BESYLATE 5 MG PO TABS
5.0000 mg | ORAL_TABLET | Freq: Every day | ORAL | Status: DC
  Administered 2020-02-16 – 2020-02-17 (×2): 5 mg via ORAL
  Filled 2020-02-16 (×2): qty 1

## 2020-02-16 MED ORDER — POLYVINYL ALCOHOL 1.4 % OP SOLN
1.0000 [drp] | Freq: Every day | OPHTHALMIC | Status: DC | PRN
  Filled 2020-02-16: qty 15

## 2020-02-16 MED ORDER — BACITRACIN ZINC 500 UNIT/GM EX OINT
TOPICAL_OINTMENT | CUTANEOUS | Status: AC
  Filled 2020-02-16: qty 28.35

## 2020-02-16 MED ORDER — BACITRACIN ZINC 500 UNIT/GM EX OINT
1.0000 "application " | TOPICAL_OINTMENT | Freq: Three times a day (TID) | CUTANEOUS | Status: DC
  Administered 2020-02-16 – 2020-02-18 (×6): 1 via TOPICAL
  Filled 2020-02-16: qty 28.35

## 2020-02-16 SURGICAL SUPPLY — 53 items
APPLIER CLIP 9.375 SM OPEN (CLIP)
APR CLP SM 9.3 20 MLT OPN (CLIP)
CANISTER SUCT 3000ML PPV (MISCELLANEOUS) ×3 IMPLANT
CLEANER TIP ELECTROSURG 2X2 (MISCELLANEOUS) ×3 IMPLANT
CLIP APPLIE 9.375 SM OPEN (CLIP) IMPLANT
CNTNR URN SCR LID CUP LEK RST (MISCELLANEOUS) IMPLANT
CONT SPEC 4OZ STRL OR WHT (MISCELLANEOUS)
CORD BIPOLAR FORCEPS 12FT (ELECTRODE) ×3 IMPLANT
COVER SURGICAL LIGHT HANDLE (MISCELLANEOUS) ×3 IMPLANT
COVER WAND RF STERILE (DRAPES) ×2 IMPLANT
DRAIN CHANNEL 15F RND FF W/TCR (WOUND CARE) IMPLANT
DRAIN SNY 10 ROU (WOUND CARE) IMPLANT
DRAIN WOUND SNY 15 RND (WOUND CARE) ×1 IMPLANT
DRAPE HALF SHEET 40X57 (DRAPES) ×1 IMPLANT
DRAPE INCISE 23X17 IOBAN STRL (DRAPES)
DRAPE INCISE 23X17 STRL (DRAPES) IMPLANT
DRAPE INCISE IOBAN 23X17 STRL (DRAPES) IMPLANT
ELECT COATED BLADE 2.86 ST (ELECTRODE) ×3 IMPLANT
ELECT REM PT RETURN 9FT ADLT (ELECTROSURGICAL) ×3
ELECTRODE REM PT RTRN 9FT ADLT (ELECTROSURGICAL) ×2 IMPLANT
EVACUATOR SILICONE 100CC (DRAIN) ×3 IMPLANT
FORCEPS BIPOLAR SPETZLER 8 1.0 (NEUROSURGERY SUPPLIES) ×3 IMPLANT
GAUZE 4X4 16PLY RFD (DISPOSABLE) ×3 IMPLANT
GLOVE BIO SURGEON STRL SZ 6.5 (GLOVE) ×3 IMPLANT
GLOVE ECLIPSE 7.5 STRL STRAW (GLOVE) ×3 IMPLANT
GOWN STRL REUS W/ TWL LRG LVL3 (GOWN DISPOSABLE) ×4 IMPLANT
GOWN STRL REUS W/TWL LRG LVL3 (GOWN DISPOSABLE) ×6
KIT BASIN OR (CUSTOM PROCEDURE TRAY) ×3 IMPLANT
KIT TURNOVER KIT B (KITS) ×3 IMPLANT
LOCATOR NERVE 3 VOLT (DISPOSABLE) IMPLANT
NDL PRECISIONGLIDE 27X1.5 (NEEDLE) ×2 IMPLANT
NEEDLE PRECISIONGLIDE 27X1.5 (NEEDLE) ×3 IMPLANT
NS IRRIG 1000ML POUR BTL (IV SOLUTION) ×3 IMPLANT
PAD ARMBOARD 7.5X6 YLW CONV (MISCELLANEOUS) ×6 IMPLANT
PENCIL FOOT CONTROL (ELECTRODE) ×3 IMPLANT
SHEARS HARMONIC 9CM CVD (BLADE) ×3 IMPLANT
SPECIMEN JAR MEDIUM (MISCELLANEOUS) IMPLANT
SPONGE INTESTINAL PEANUT (DISPOSABLE) IMPLANT
SPONGE LAP 18X18 RF (DISPOSABLE) IMPLANT
STAPLER VISISTAT 35W (STAPLE) ×3 IMPLANT
SUT CHROMIC 3 0 SH 27 (SUTURE) ×4 IMPLANT
SUT CHROMIC 5 0 P 3 (SUTURE) IMPLANT
SUT ETHILON 3 0 PS 1 (SUTURE) ×3 IMPLANT
SUT ETHILON 5 0 PS 2 18 (SUTURE) IMPLANT
SUT SILK 2 0 REEL (SUTURE) IMPLANT
SUT SILK 3 0 SH CR/8 (SUTURE) ×3 IMPLANT
SUT SILK 4 0 REEL (SUTURE) ×4 IMPLANT
SUT VIC AB 3-0 SH 18 (SUTURE) IMPLANT
SUT VIC AB 3-0 SH 27 (SUTURE) ×6
SUT VIC AB 3-0 SH 27X BRD (SUTURE) IMPLANT
TRAY ENT MC OR (CUSTOM PROCEDURE TRAY) ×3 IMPLANT
TRAY FOLEY MTR SLVR 14FR STAT (SET/KITS/TRAYS/PACK) ×1 IMPLANT
TUBE FEEDING 10FR FLEXIFLO (MISCELLANEOUS) IMPLANT

## 2020-02-16 NOTE — Progress Notes (Signed)
Patient ID: Erika [name], female   DOB: Sep 29, 1945, 74 y.o.   MRN: [id number] Postop rounds.  She is currently asleep after having received some morphine and Phenergan. She has drank very small amounts but has had some nausea and vomiting.  She is resting quietly. Breathing is clear. There is no significant swelling around the tongue. Neck incision looks excellent. Flaps are down and the JP drain is functioning and holding a seal.  Stable postop check. Continue care.

## 2020-02-16 NOTE — Interval H&P Note (Signed)
History and Physical Interval Note:  02/16/2020 7:20 AM  Erika [name]  has presented today for surgery, with the diagnosis of Tongue Cancer.  The various methods of treatment have been discussed with the patient and family. After consideration of risks, benefits and other options for treatment, the patient has consented to  Procedure(s): EXCISION OF TONGUE LESION Partial Glossectomy (N/A) RADICAL NECK DISSECTION Partial Left Neck Dissection (Left) as a surgical intervention.  The patient's history has been reviewed, patient examined, no change in status, stable for surgery.  I have reviewed the patient's chart and labs.  Questions were answered to the patient's satisfaction.     [name]

## 2020-02-16 NOTE — Op Note (Signed)
OPERATIVE REPORT  DATE OF SURGERY: 02/16/2020  PATIENT:  Erika [name],  74 y.o. female  PRE-OPERATIVE DIAGNOSIS:  Tongue Cancer  POST-OPERATIVE DIAGNOSIS:  Tongue Cancer  PROCEDURE:  Procedure(s): Left Partial Glossectomy Selective Left Neck Dissection, levels 1 2 and 3  SURGEON:  Beckie Salts, MD  ASSISTANTS: None  ANESTHESIA:   General   EBL: 50 ml  DRAINS: 15 French round JP  LOCAL MEDICATIONS USED:  None  SPECIMEN: 1.  Left selective neck dissection levels 1 2 and 3, oriented on a towel for pathologic evaluation. 2.  Left partial glossectomy, single suture marks anterior margin, double suture marks dorsal margin.  All 4 mucosal margins and deep margin all negative on frozen section.  COUNTS:  Correct  PROCEDURE DETAILS: The patient was taken to the operating room and placed on the operating table in the supine position. Following induction of general endotracheal anesthesia, using a nasal tube, the neck and face were prepped in a standard fashion.  Drapes were applied.  1.  Left selective neck dissection.  A left hemiapron incision incision was outlined with a marking pen and incised using electrocautery.  Cautery dissection down through platysma layer was accomplished and subplatysmal flaps were elevated superiorly up to the mandible and inferiorly to the lower neck just below the omohyoid muscle crossing the sternocleidomastoid muscle.  Stay sutures were used on the superior flap.  A thorough dissection of all fibrofatty tissue and lymph node bearing tissue from levels 1 through 3 was accomplished.  The marginal mandibular branch of the facial nerve was preserved and reflected superiorly.  The greater regular nerve was reserved and kept posterior to the level of dissection.  The submandibular triangle was cleaned of all fibrofatty tissue including the submandibular gland.  There were 2 enlarged facial nodes which were included in specimen.  Facial vessels were ligated  between clamps and divided.  The lingual nerve was identified and the ganglion was transected and sacrificed.  Facial artery was ligated between clamps and divided.  4-0 silk ties were used throughout.  The submental fat pad was also cleaned off of the space between the anterior belly of the digastric muscles.  Fascia of the lateral surface of the sternocleidomastoid muscle was dissected anteriorly.  The muscle was reflected posteriorly.  The spinal sensory nerve was identified and dissected up towards the base of skull.  The lymph node bearing tissue posterior superior to the spinal accessory nerve was dissected down to the splenius capitis and levator scapula muscles, dissected under the nerve and brought forward.  The internal jugular vein was identified and cleaned off of all fibrofatty tissue.  The vagus nerve and carotid system were preserved.  The hypoglossal nerve was also identified and preserved.  The dissection continued down to the level of the omohyoid muscle where it crosses the sternocleidomastoid muscle.  This was the lower limit of dissection.  Posteriorly the cervical nerve roots were preserved and fibrofatty tissue was brought forward.  The specimen was removed and sent for pathologic valuation.  The wound was irrigated with saline.  The drain was exited through a separate stab incision inferiorly secured in place with a nylon suture.  Hemostasis was completed.  The platysmal layer was reapproximated with interrupted chromic suture and staples were used on the skin.  Bacitracin was applied at the end of the case.  2.  Left partial glossectomy.  The tongue was inspected.  There were large mandibular tori bilaterally.  These were left unmolested.  The prior biopsy site was identified in the left posterior lateral tongue.  Generous submucosal incisions were outlined with electrocautery.  The tip of the tongue was retracted using a large towel clip.  Electrocautery was used to perform the entire  dissection.  Larger vessels were ligated between clamps and divided.  Adequate mucosal and deep muscular soft tissue margins were obtained and the specimen sent for frozen section analysis which was all negative.  Hemostasis was completed and the wound was reapproximated using 3-0 Vicryl suture.  The deep muscle was reapproximated in an interrupted fashion.  The mucosal surface was reapproximated using inverted interrupted sutures as well.  There was good apposition.  There is no further bleeding.  Oral cavity and pharynx were suctioned blood and secretions.  A nasogastric tube was inserted into the left side for feeding if necessary.  This was secured in place with benzoin and tape.  Patient was awakened extubated and transferred to recovery in stable condition.    PATIENT DISPOSITION:  To PACU, stable

## 2020-02-16 NOTE — Anesthesia Postprocedure Evaluation (Signed)
Anesthesia Post Note  Patient: ZARA WENDT  Procedure(s) Performed: EXCISION OF TONGUE LESION Partial Glossectomy (N/A Mouth) RADICAL NECK DISSECTION Partial Left Neck Dissection (Left Neck)     Patient location during evaluation: PACU Anesthesia Type: General Level of consciousness: awake and alert Pain management: pain level controlled Vital Signs Assessment: post-procedure vital signs reviewed and stable Respiratory status: spontaneous breathing, nonlabored ventilation, respiratory function stable and patient connected to nasal cannula oxygen Cardiovascular status: blood pressure returned to baseline and stable Postop Assessment: no apparent nausea or vomiting Anesthetic complications: no   No complications documented.  Last Vitals:  Vitals:   02/16/20 1115 02/16/20 1130  BP: (!) 165/75 (!) 155/67  Pulse: 76 77  Resp: (!) 21 12  Temp:    SpO2: 98% 99%    Last Pain:  Vitals:   02/16/20 1141  TempSrc:   PainSc: 5                  Mina Carlisi S

## 2020-02-16 NOTE — Transfer of Care (Signed)
Immediate Anesthesia Transfer of Care Note  Patient: Erika [name]  Procedure(s) Performed: EXCISION OF TONGUE LESION Partial Glossectomy (N/A Mouth) RADICAL NECK DISSECTION Partial Left Neck Dissection (Left Neck)  Patient Location: PACU  Anesthesia Type:General  Level of Consciousness: awake, alert  and oriented  Airway & Oxygen Therapy: Patient Spontanous Breathing  Post-op Assessment: Report given to RN, Post -op Vital signs reviewed and stable and Patient moving all extremities  Post vital signs: Reviewed and stable  Last Vitals:  Vitals Value Taken Time  BP 174/81 02/16/20 1031  Temp 36.1 C 02/16/20 1030  Pulse 72 02/16/20 1035  Resp 15 02/16/20 1035  SpO2 93 % 02/16/20 1035  Vitals shown include unvalidated device data.  Last Pain:  Vitals:   02/16/20 1030  TempSrc:   PainSc: (P) 0-No pain         Complications: No complications documented.

## 2020-02-16 NOTE — Anesthesia Procedure Notes (Signed)
Procedure Name: Intubation Date/Time: 02/16/2020 7:42 AM Performed by: Amadeo Garnet, CRNA Pre-anesthesia Checklist: Patient identified, Emergency Drugs available, Suction available, Patient being monitored and Timeout performed Patient Re-evaluated:Patient Re-evaluated prior to induction Oxygen Delivery Method: Circle system utilized Preoxygenation: Pre-oxygenation with 100% oxygen Induction Type: IV induction Ventilation: Mask ventilation without difficulty Laryngoscope Size: Mac and 4 Grade View: Grade I Nasal Tubes: Nasal Rae Tube size: 7.0 mm Number of attempts: 1 Airway Equipment and Method: Stylet Placement Confirmation: ETT inserted through vocal cords under direct vision,  positive ETCO2 and breath sounds checked- equal and bilateral Secured at: 24 cm Tube secured with: Tape Dental Injury: Teeth and Oropharynx as per pre-operative assessment  Comments: Bilateral nare sprayed with Afrin in preop. R nare dilated with 30 fr and 32 fr nasal trumpet. 7.0 Nasal rare passed atraumatically through nasal passage.

## 2020-02-17 ENCOUNTER — Encounter (HOSPITAL_COMMUNITY): Payer: Self-pay | Admitting: Otolaryngology

## 2020-02-17 LAB — GLUCOSE, CAPILLARY
Glucose-Capillary: 100 mg/dL — ABNORMAL HIGH (ref 70–99)
Glucose-Capillary: 110 mg/dL — ABNORMAL HIGH (ref 70–99)
Glucose-Capillary: 111 mg/dL — ABNORMAL HIGH (ref 70–99)
Glucose-Capillary: 124 mg/dL — ABNORMAL HIGH (ref 70–99)

## 2020-02-17 LAB — BASIC METABOLIC PANEL
Anion gap: 7 (ref 5–15)
BUN: 8 mg/dL (ref 8–23)
CO2: 22 mmol/L (ref 22–32)
Calcium: 7.7 mg/dL — ABNORMAL LOW (ref 8.9–10.3)
Chloride: 103 mmol/L (ref 98–111)
Creatinine, Ser: 0.68 mg/dL (ref 0.44–1.00)
GFR, Estimated: >60 mL/min (ref >60)
Glucose, Bld: 499 mg/dL — ABNORMAL HIGH (ref 70–99)
Potassium: 3.4 mmol/L — ABNORMAL LOW (ref 3.5–5.1)
Sodium: 132 mmol/L — ABNORMAL LOW (ref 135–145)

## 2020-02-17 MED ORDER — INSULIN ASPART 100 UNIT/ML ~~LOC~~ SOLN
0.0000 [IU] | SUBCUTANEOUS | Status: DC
  Administered 2020-02-18: 2 [IU] via SUBCUTANEOUS

## 2020-02-17 NOTE — Progress Notes (Signed)
Patient ID: Erika [name], female   DOB: 1946/02/12, 74 y.o.   MRN: [id number] Subjective: Doing well, taking p.o. liquids and medications without much difficulty.  Her biggest complaint is the nasogastric tube is very uncomfortable.  Objective: Vital signs in last 24 hours: Temp:  [97 F (36.1 C)-98.3 F (36.8 C)] 98.2 F (36.8 C) (10/23 0528) Pulse Rate:  [64-91] 64 (10/23 0528) Resp:  [9-21] 15 (10/23 0528) BP: (118-174)/(60-84) 129/60 (10/23 0528) SpO2:  [94 %-100 %] 100 % (10/23 0528) Weight change:     Intake/Output from previous day: 10/22 0701 - 10/23 0700 In: 1365 [P.O.:315; I.V.:1000; IV Piggyback:50] Out: 420 [Urine:200; Emesis/NG output:100; Drains:110; Blood:10] Intake/Output this shift: No intake/output data recorded.  PHYSICAL EXAM: She is awake and alert.  The neck incision looks excellent.  Slight ecchymosis in one area anteriorly.  There is no swelling.  The JP drain is holding a seal and with serosanguineous drainage.  The lower lip is weak.  Oral cavity and pharynx reveals the tongue is healing nicely without any ecchymosis swelling or bleeding.  Mobility is good.  Lab Results: No results for input(s): WBC, HGB, HCT, PLT in the last 72 hours. BMET No results for input(s): NA, K, CL, CO2, GLUCOSE, BUN, CREATININE, CALCIUM in the last 72 hours.  Studies/Results: DG Abd 1 View  Result Date: 02/16/2020 CLINICAL DATA:  NG tube placement EXAM: ABDOMEN - 1 VIEW COMPARISON:  None. FINDINGS: NG tube tip is in the mid to distal stomach. Nonobstructive bowel gas pattern. IMPRESSION: NG tube in the stomach. Electronically Signed   By: Rolm Baptise M.D.   On: 02/16/2020 17:52    Medications: I have reviewed the patient's current medications.  Assessment/Plan: Stable postop day 1.  We will check her sodium level today to make sure she is not having any trouble with hyponatremia as she has in the past.  Nasogastric feeding tube was removed.  She would like to go home.  If  she is tolerating adequate liquids today and we can let her go home later today with the drain in place and remove it in a few days in the office.  LOS: 1 day   Erika [name] 02/17/2020, 8:48 AM

## 2020-02-18 LAB — GLUCOSE, CAPILLARY
Glucose-Capillary: 119 mg/dL — ABNORMAL HIGH (ref 70–99)
Glucose-Capillary: 122 mg/dL — ABNORMAL HIGH (ref 70–99)

## 2020-02-18 NOTE — Discharge Summary (Signed)
Physician Discharge Summary  Patient ID: Erika [name] MRN: [id number] DOB/AGE: [birth year] 76 y.o.  Admit date: 02/16/2020 Discharge date: 02/18/2020  Admission Diagnoses: Tongue cancer  Discharge Diagnoses:  Active Problems:   Tongue cancer [facility])   Discharged Condition: good  Hospital Course: No complications  Consults: none  Significant Diagnostic Studies: none  Treatments: surgery: Partial glossectomy and modified neck dissection  Discharge Exam: Blood pressure (!) 173/75, pulse 75, temperature 97.7 F (36.5 C), temperature source Oral, resp. rate 15, height 5\' 3"  (1.6 m), weight 66.2 kg, SpO2 94 %. PHYSICAL EXAM: Awake and alert, breathing easily and talking well.  Surgical site in the tongue is healing nicely without any signs of infection.  Neck looks excellent.  Drain is still in place.  Disposition: Discharge disposition: 01-Home or Self Care       Discharge Instructions    Diet - low sodium heart healthy   Complete by: As directed    Diet full liquid   Complete by: As directed    Discharge wound care:   Complete by: As directed    Apply antibiotic ointment to the staples twice daily.  Empty the drain, measure and record the amount, and recharge the drain about every 8 hours.   Increase activity slowly   Complete by: As directed      Allergies as of 02/18/2020      Reactions   Hydrochlorothiazide Other (See Comments)   Brain swelling   Atorvastatin Other (See Comments)   cramps   Codeine Nausea And Vomiting   Strawberry Flavor Itching      Medication List    TAKE these medications   acetaminophen 500 MG tablet Commonly known as: TYLENOL Take 1,000 mg by mouth every 6 (six) hours as needed for moderate pain or headache.   amLODipine 5 MG tablet Commonly known as: NORVASC Take 1 tablet (5 mg total) by mouth daily. Please make overdue appt with Dr. Tamala Julian before anymore refills. 3rd and Final Attempt What changed: when to take this    ARTIFICIAL TEARS OP Place 1 drop into both eyes daily as needed (dry eyes).   aspirin EC 81 MG tablet Take 81 mg by mouth daily.   CALCIUM 600 + D PO Take 1 tablet by mouth daily.   cetirizine 10 MG tablet Commonly known as: ZYRTEC Take 10 mg by mouth daily.   diclofenac Sodium 1 % Gel Commonly known as: VOLTAREN Apply 1 application topically 4 (four) times daily as needed (pain).   esomeprazole 40 MG capsule Commonly known as: NEXIUM Take 40 mg by mouth daily.   famotidine 20 MG tablet Commonly known as: PEPCID Take 20 mg by mouth at bedtime.   fluticasone 50 MCG/ACT nasal spray Commonly known as: FLONASE INSTILL 2 SPRAYS IN EACH NOSTRIL EVERY DAY. What changed: See the new instructions.   losartan 100 MG tablet Commonly known as: COZAAR Take 1 tablet (100 mg total) by mouth daily. Please make overdue appt with Dr. Tamala Julian before anymore refills. 2nd attempt   metoprolol tartrate 25 MG tablet Commonly known as: LOPRESSOR Take 25 mg by mouth 2 (two) times daily.   multivitamin with minerals Tabs tablet Take 1 tablet by mouth daily.   nitrofurantoin 50 MG capsule Commonly known as: MACRODANTIN Take 50 mg by mouth at bedtime.   RA Probiotic Gummies Chew Chew 2 capsules by mouth daily.   simvastatin 20 MG tablet Commonly known as: ZOCOR Take 20 mg by mouth at bedtime.   trolamine salicylate  10 % cream Commonly known as: ASPERCREME Apply 1 application topically as needed for muscle pain.            Discharge Care Instructions  (From admission, onward)         Start     Ordered   02/18/20 0000  Discharge wound care:       Comments: Apply antibiotic ointment to the staples twice daily.  Empty the drain, measure and record the amount, and recharge the drain about every 8 hours.   02/18/20 0354          Follow-up Information    [name], MD. Schedule an appointment as soon as possible for a visit on 02/20/2020.   Specialty:  Otolaryngology Contact information: [address] [place] [address] [phone]               Signed: [name] 02/18/2020, 9:51 AM

## 2020-02-18 NOTE — Discharge Instructions (Signed)
Stay on a liquid diet, try to have some calories and protein such as protein shakes, smoothies, milkshakes, etc.  You may try some soft foods such as scrambled eggs, pasta, soft bread.  Try to walk around several times daily.  Try to keep her legs moving as much as possible even when lying or sitting.  It is okay to brush her teeth, just be very careful around the left side of the tongue as he may cause some discomfort.  He can also rinse with mouthwash and/or salt water as needed.  Okay to use Tylenol and/or Motrin as needed for pain.

## 2020-02-18 NOTE — Progress Notes (Signed)
Pt and her son educated on drain prior to leaving the unit. Discharged home in stable condition

## 2020-02-19 LAB — HEMOGLOBIN A1C
Hgb A1c MFr Bld: 5.9 % — ABNORMAL HIGH (ref 4.8–5.6)
Mean Plasma Glucose: 123 mg/dL

## 2020-02-21 ENCOUNTER — Encounter: Payer: Medicare Other | Admitting: Podiatry

## 2020-02-21 LAB — SURGICAL PATHOLOGY

## 2020-02-28 ENCOUNTER — Encounter: Payer: Medicare Other | Admitting: Podiatry

## 2020-03-07 NOTE — Progress Notes (Signed)
Oncology Nurse Navigator Documentation  Placed introductory call to new referral patient Erika [name].  Introduced myself as the H&N oncology nurse navigator that works with Dr. Isidore Moos to whom she has been referred by Dr. Constance Holster.  She confirmed understanding of referral.  Briefly explained my role as her navigator, provided my contact information.   Confirmed understanding of upcoming appts and [place] location, explained arrival and registration process.  I explained the purpose of a dental evaluation prior to starting RT, indicated she would be contacted by [facility] DM to arrange an appt.    I encouraged her to call with questions/concerns as she moves forward with appts and procedures.    She verbalized understanding of information provided, expressed appreciation for my call.   Navigator Initial Assessment . Employment Status: She is retired . Currently on FMLA / STD: No . Living Situation: She lives with a disabled son and disabled daughter-in-law . Support System: son and daughter in law . PCP: [name] MD . PCD: Dr. Norberta Keens . Financial Concerns:no . Transportation Needs: no . Sensory Deficits:no . Language Barriers/Interpreter Needed:  no . Ambulation Needs: no . DME Used in Home: no . Psychosocial Needs:  no . Concerns/Needs Understanding Cancer:  addressed/answered by navigator to best of ability . Self-Expressed Needs: no   Harlow Asa RN, BSN, OCN Head & Neck Oncology Nurse [place] [facility] Phone # [phone]  Fax # [phone]

## 2020-03-11 DIAGNOSIS — M278 Other specified diseases of jaws: Secondary | ICD-10-CM

## 2020-03-11 DIAGNOSIS — K0510 Chronic gingivitis, plaque induced: Secondary | ICD-10-CM | POA: Diagnosis not present

## 2020-03-11 DIAGNOSIS — M270 Developmental disorders of jaws: Secondary | ICD-10-CM

## 2020-03-11 DIAGNOSIS — K030 Excessive attrition of teeth: Secondary | ICD-10-CM | POA: Diagnosis not present

## 2020-03-11 DIAGNOSIS — K06010 Localized gingival recession, unspecified: Secondary | ICD-10-CM | POA: Diagnosis not present

## 2020-03-11 DIAGNOSIS — K08409 Partial loss of teeth, unspecified cause, unspecified class: Secondary | ICD-10-CM | POA: Diagnosis not present

## 2020-03-13 ENCOUNTER — Encounter: Payer: Medicare Other | Admitting: Podiatry

## 2020-03-13 ENCOUNTER — Ambulatory Visit (HOSPITAL_COMMUNITY): Payer: Self-pay | Admitting: Dentistry

## 2020-03-13 ENCOUNTER — Other Ambulatory Visit: Payer: Self-pay

## 2020-03-13 NOTE — Consult Note (Signed)
DENTAL VISIT OUTPATIENT CONSULTATION  Service Date:   03/13/2020 Referring Provider:                  Eppie Gibson, MD  Patient Name:   Erika [name] Date of Birth:   July 24, 1945 Medical Record Number: [id number]   ______________________________  PLAN/RECOMMENDATIONS: . Patient is cleared for radiation therapy from a dental perspective at this time.  There are NO signs of severe dental decay, acute infection or abscess, or suspicious intraoral/extraoral pathology requiring biopsy.  . We will schedule for delivery of scatter guard as soon as patient has confirmed XRT prior to simulation.  . Recommend patient establish care at a dental office of her choice once optimized medically for routine dental care. . Discussed in detail all options with the patient and she is agreeable to the plan.  Thank you for consulting with Hospital Dentistry and for the opportunity to participate in this patient's treatment.  Should you have any questions or concerns, please contact the [facility] at [phone]. ______________________________   COVID 19 SCREENING: The patient denies symptoms concerning for COVID-19 infection including fever, chills, cough, or newly developed shortness of breath.   HPI: Erika [name] is a very pleasant 74 y.o. female with h/o hyperlipidemia, A-fib, HTN, GERD and recently diagnosed SCC of the tongue s/p surgical resection and anticipating concurrent chemo/XRT who presents today for dental evaluation.  Dental was consulted for an evaluation as part of Pre-XRT work-up.   Dental History: Patient reports having a dentist that she sees regularly.  She started to notice an ulcer on the left side of the base of her tongue which was hurting and would not go away.  Her old dentist prescribed her medication to use for it, and patient eventually sought a second opinion from another dental office, which is now her regular dentist.  She was diagnosed after a biopsy when she went in to  see Dr. Constance Holster for ear pain.  She states that she had a cleaning scheduled in September, however this was cancelled due to her recent diagnosis.  She states having difficulty brushing and flossing because she still has numbness on the left side of her lip and tongue after her surgery.  She states she has some difficulty swallowing on the left side, but no pain.  She currently denies any dental related pain or sensitivity. Patient able to manage oral secretions.  Patient denies odynophagia, dysphonia, SOB and neck pain.  Patient denies fever, rigors and malaise.   CHIEF COMPLAINT: "I still have some numbness on the left side from my surgery and difficulty swallowing."   Patient Active Problem List   Diagnosis Date Noted  . Tongue cancer ([place]) 02/16/2020  . Hyperlipidemia 12/14/2017  . Abnormal EKG 12/14/2017  . Thyroid nodule 12/14/2017  . A-fib ([place])   . AF (paroxysmal atrial fibrillation) ([place]) 10/15/2017  . Leukopenia 10/12/2017  . Thrombocytopenia ([place]) 10/12/2017  . Elevated transaminase level 10/12/2017  . Acute hyponatremia 10/12/2017  . Sepsis secondary to UTI ([place]) 10/12/2017  . Hypertension 10/12/2017  . Positive D dimer 10/12/2017  . Recurrent UTI 10/12/2017  . GERD (gastroesophageal reflux disease) 10/12/2017  . Arthritis 10/12/2017  . Tick bites 10/12/2017  . Status post total right knee replacement 03/31/2015  . Chronic cough 11/03/2012   Past Medical History:  Diagnosis Date  . A-fib ([place])   . Adrenal nodule ([place])   . Anxiety   . Arthritis   . Atrophic vaginitis   . Chronic  hyponatremia   . Dysrhythmia   . Female climacteric state   . GERD (gastroesophageal reflux disease)   . Headache   . Hypercholesteremia   . Hyperlipidemia   . Hypertension   . Leukopenia   . PONV (postoperative nausea and vomiting)   . Recurrent UTI   . Right thyroid nodule   . Thrombocytopenia ([place])   . Tongue ulcer    Past Surgical History:  Procedure Laterality Date  .  ESOPHAGOGASTRODUODENOSCOPY    . EXCISION OF TONGUE LESION N/A 02/16/2020   Procedure: EXCISION OF TONGUE LESION Partial Glossectomy;  Surgeon: [name], MD;  Location: [facility];  Service: ENT;  Laterality: N/A;  . HERNIA REPAIR     x2  groin  . JOINT REPLACEMENT    . PARTIAL GLOSSECTOMY  02/16/2020   EXCISION OF TONGUE LESION Partial Glossectomy (N/A Mouth)  . RADICAL NECK DISSECTION Left 02/16/2020   Procedure: RADICAL NECK DISSECTION Partial Left Neck Dissection;  Surgeon: [name], MD;  Location: [facility];  Service: ENT;  Laterality: Left;  . REPLACEMENT TOTAL KNEE     right knee  . SKIN CANCER EXCISION  05/2017   Excised from calf area  . TONSILLECTOMY    . TUBAL LIGATION     Allergies  Allergen Reactions  . Hydrochlorothiazide Other (See Comments)    Brain swelling  . Atorvastatin Other (See Comments)    cramps  . Codeine Nausea And Vomiting  . Strawberry Flavor Itching   Current Outpatient Medications  Medication Sig Dispense Refill  . acetaminophen (TYLENOL) 500 MG tablet Take 1,000 mg by mouth every 6 (six) hours as needed for moderate pain or headache.    Marland Kitchen amLODipine (NORVASC) 5 MG tablet Take 1 tablet (5 mg total) by mouth daily. Please make overdue appt with Dr. Tamala Julian before anymore refills. 3rd and Final Attempt (Patient taking differently: Take 5 mg by mouth at bedtime. Please make overdue appt with Dr. Tamala Julian before anymore refills. 3rd and Final Attempt) 15 tablet 0  . aspirin EC 81 MG tablet Take 81 mg by mouth daily.    . Calcium Carb-Cholecalciferol (CALCIUM 600 + D PO) Take 1 tablet by mouth daily.    . Carboxymethylcellulose Sodium (ARTIFICIAL TEARS OP) Place 1 drop into both eyes daily as needed (dry eyes).    . cetirizine (ZYRTEC) 10 MG tablet Take 10 mg by mouth daily.    . diclofenac Sodium (VOLTAREN) 1 % GEL Apply 1 application topically 4 (four) times daily as needed (pain).    Marland Kitchen esomeprazole (NEXIUM) 40 MG capsule Take 40 mg by mouth daily.  5  .  famotidine (PEPCID) 20 MG tablet Take 20 mg by mouth at bedtime.     . fluticasone (FLONASE) 50 MCG/ACT nasal spray INSTILL 2 SPRAYS IN EACH NOSTRIL EVERY DAY. (Patient taking differently: Place 2 sprays into both nostrils daily. ) 16 g 3  . losartan (COZAAR) 100 MG tablet Take 1 tablet (100 mg total) by mouth daily. Please make overdue appt with Dr. Tamala Julian before anymore refills. 2nd attempt 15 tablet 0  . metoprolol tartrate (LOPRESSOR) 25 MG tablet Take 25 mg by mouth 2 (two) times daily.     . Multiple Vitamin (MULTIVITAMIN WITH MINERALS) TABS tablet Take 1 tablet by mouth daily.    . nitrofurantoin (MACRODANTIN) 50 MG capsule Take 50 mg by mouth at bedtime.     . Probiotic Product (RA PROBIOTIC GUMMIES) CHEW Chew 2 capsules by mouth daily.    . simvastatin (  ZOCOR) 20 MG tablet Take 20 mg by mouth at bedtime.     . trolamine salicylate (ASPERCREME) 10 % cream Apply 1 application topically as needed for muscle pain.     No current facility-administered medications for this visit.    LABS: Lab Results  Component Value Date   WBC 4.9 02/13/2020   HGB 12.4 02/13/2020   HCT 38.1 02/13/2020   MCV 93.2 02/13/2020   PLT 292 02/13/2020      Component Value Date/Time   NA 132 (L) 02/17/2020 1001   NA 134 05/26/2018 0926   K 3.4 (L) 02/17/2020 1001   CL 103 02/17/2020 1001   CO2 22 02/17/2020 1001   GLUCOSE 499 (H) 02/17/2020 1001   BUN 8 02/17/2020 1001   BUN 12 05/26/2018 0926   CREATININE 0.68 02/17/2020 1001   CALCIUM 7.7 (L) 02/17/2020 1001   GFRNONAA >60 02/17/2020 1001   GFRAA 82 05/26/2018 0926   Lab Results  Component Value Date   INR 1.22 10/12/2017   INR 1.02 10/12/2017   No results found for: PTT  Social History   Socioeconomic History  . Marital status: Divorced    Spouse name: Not on file  . Number of children: 3  . Years of education: Not on file  . Highest education level: Not on file  Occupational History  . Occupation: Art gallery manager Rep     Employer: [employer]  Tobacco Use  . Smoking status: Never Smoker  . Smokeless tobacco: Never Used  Vaping Use  . Vaping Use: Never used  Substance and Sexual Activity  . Alcohol use: No  . Drug use: No  . Sexual activity: Not on file  Other Topics Concern  . Not on file  Social History Narrative  . Not on file   Social Determinants of Health   Financial Resource Strain:   . Difficulty of Paying Living Expenses: Not on file  Food Insecurity:   . Worried About Charity fundraiser in the Last Year: Not on file  . Ran Out of Food in the Last Year: Not on file  Transportation Needs:   . Lack of Transportation (Medical): Not on file  . Lack of Transportation (Non-Medical): Not on file  Physical Activity:   . Days of Exercise per Week: Not on file  . Minutes of Exercise per Session: Not on file  Stress:   . Feeling of Stress : Not on file  Social Connections:   . Frequency of Communication with Friends and Family: Not on file  . Frequency of Social Gatherings with Friends and Family: Not on file  . Attends Religious Services: Not on file  . Active Member of Clubs or Organizations: Not on file  . Attends Archivist Meetings: Not on file  . Marital Status: Not on file  Intimate Partner Violence:   . Fear of Current or Ex-Partner: Not on file  . Emotionally Abused: Not on file  . Physically Abused: Not on file  . Sexually Abused: Not on file   Family History  Problem Relation Age of Onset  . Emphysema Sister   . Emphysema Brother   . Hypertension Mother   . Heart failure Mother   . Hypertension Father   . CVA Father      REVIEW OF SYSTEMS: Reviewed with the patient as per HPI. PSYCH: Patient denies having dental phobia.   VITAL SIGNS: BP (!) 152/70 (BP Location: Right Arm)   Pulse 62  Temp 98.3 F (36.8 C)    PHYSICAL EXAMINATION: GENERAL: Well-developed, comfortable and in no apparent distress. NEUROLOGICAL: Alert and oriented to person,  place and time. EXTRAORAL:  Facial symmetry present without any edema or erythema.  No swelling or lymphadenopathy.  Slight dropping evident on the lower lip left of midline.  Some popping of right TMJ upon opening. INTRAORAL: Soft tissues appear well-perfused and mucous membranes moist.  FOM and vestibules soft and not raised. Oral cavity without mass or lesion. No signs of infection, parulis, sinus tract, edema or erythema evident upon exam.  Very large bilateral mandibular tori, almost spanning to occlusal surfaces of teeth on the left side. Maxillary and Mandibular exostosis.  DENTAL EXAMINATION: DENTITION: Overall good remaining dentition.  Missing teeth, heavily restored dentition, mandibular anterior teeth with attrition. The patient is maintaining fair oral hygiene. PERIODONTAL: Inflamed, erythematous gingival tissue.  Gingival recession.  Moderate, generalized plaque and calculus accumulation.   DENTAL CARIES/DEFECTIVE RESTORATIONS: No clinical caries evident.  All existing crown/bridge work appears to be stable and in good condition. ENDODONTIC: #2, #3, #8 and #30 previously endo treated with definitive crowns. CROWN/BRIDGE: Patient has heavily restored remaining dentition.  4-unit bridge on the lower left quadrant, and >50% of her other remaining dentition with full coverage crowns. PROSTHODONTIC: Patient denies having removable dentures. OCCLUSION: Class I molar occlusion.  MIO = 2mm  RADIOGRAPHIC EXAMINATION PAN exposed and interpreted: Condyles seated bilaterally in fossas.  No evidence of abnormal pathology.  All visualized osseous structures appear WNL. Generalized mild horizontal bone loss consistent with chronic mild periodontitis. Slight radiographic calculus accumulation evident. Missing teeth, existing restorations, heavily restored remaining dentition with multiple crowns/bridges. #2, #3, #8 and #30 previously endo treated with definitive crowns.  #18-#21 4-unit bridge with  #20 and #19 acting as pontics.    ASSESSMENT 1. SCC of the tongue s/p surgical resection 2. Pre-Radiation Therapy 3. HTN 4. Hyperlipidemia 5. A-fib 6. Arthritis 7. SIADH 8. Seasonal allergies 9. Missing teeth 10. Excessive attrition  11. Chronic gingivitis 12. Gingival recession 13. Large bilateral mandibular tori 14. Exostosis of Maxilla and Mandible   PLAN/RECOMMENDATIONS  . I discussed the risks, benefits, and complications of various treatment options with the patient in relationship to her medical and dental conditions. We discussed various treatment options to include no treatment, pre-prosthetic surgery as indicated, periodontal therapy, dental restorations, root canal therapy, crown and bridge therapy, implant therapy, and replacement of missing teeth as indicated.   . Since patient does not have a definitive XRT plan at this time, I will wait to go into depth on counseling of side effects of XRT and importance of dental care during/after XRT until she knows for sure her plan.  Maxillary impression taken today with alginate for scatter guard protection.  Unable to take impression of the mandibular arch due to the size of her mandibular tori, and fabricating a lower SG would cause more harm than good if it were to cut her mucosa or leave sore spots/ulcerations.  . The patient verbalized understanding of all options, and currently wishes to proceed with returning for counseling and scatter guard delivery should she move forward with radiation therapy.  We will schedule her to return for scatter guard delivery pending visit with Dr. Isidore Moos.  . Discussion of findings with medical team and coordination of future medical and dental care as needed.   The patient tolerated today's visit well.  All questions/concerns were addressed and the patient departed in stable condition.   I  spent in excess of 120 minutes during the conduct of this consultation and >50% of this time involved  direct face-to-face encounter for counseling and/or coordination of the patient's care.   [place], DMD

## 2020-03-13 NOTE — Patient Instructions (Signed)
[facility] Dr. Naika Noto B. Devontay Celaya Phone: [phone] Fax: [phone]   It was a pleasure seeing you today!  Please refer to the information below regarding your dental visit with us, and call us should you have any questions or concerns that may come up after you leave.   Thank you for giving us the opportunity to provide care for you.  If there is anything we can do for you, please let us know.     RADIATION THERAPY AND INFORMATION REGARDING YOUR TEETH   [x]Xerostomia (Dry Mouth) Your salivary glands may be in the field of radiation.  Radiation may include all or only part of your salivary glands.  This will cause your saliva to dry up, and you will have a dry mouth.  The dry mouth will be for the rest of your life unless your radiation oncologist tells you otherwise.  Your saliva has many functions:  It wets your tongue for speaking.  It coats your teeth and the inside of your mouth for easier movement.  It helps with chewing and swallowing food.  It helps clean away harmful acid and toxic products made by the germs in your mouth, therefore it helps prevent cavities.  It kills some germs in your mouth and helps to prevent gum disease.  It helps to carry flavor to your taste buds.  Once you have lost your saliva you will be at higher risk for tooth decay and gum disease.    What can be done to help improve your mouth when there's not enough saliva: . Your dentist may give a prescription for Salagen.  It will not bring back all of your saliva but may bring back some of it.  Also, your saliva may be thick and ropy or white and foamy. It will not feel like it use to feel. . You will need to swish with water every time your mouth feels dry.  YOU CANNOT suck on any cough drops, mints, lemon drops, candy, vitamin C or any other products.  You cannot use anything other than water to make your mouth feel less dry.  If you want to drink anything else,  you have to drink it all at once and brush afterwards.  Be sure to discuss the details of your diet habits with your dentist or hygienist.  [x]Radiation caries: . This is decay (cavities) that happens very quickly once your mouth is very dry due to radiation therapy.  Normally, cavities take six months to two years to become a problem.  When you have dry mouth, cavities may take as little as eight weeks to cause you a problem.   . Dental check-ups every two months are necessary as long as you have a dry mouth. Radiation caries typically, but not always, start at your gum line where it is hard to see the cavity.  It is therefore also hard to fill these cavities adequately.  This high rate of cavities happens because your mouth no longer has saliva and therefore the acid made by the germs starts the decay process.  Whenever you eat anything the germs in your mouth change the food into acid.  The acid then burns a small hole in your tooth.  This small hole is the beginning of a cavity.  If this is not treated then it will grow bigger and become a cavity.  The way to avoid this hole getting bigger is to use fluoride every evening as prescribed by your   dentist following your radiation.   NOTE:  You have to make sure that your teeth are very clean before you use the fluoride.  This fluoride in turn will strengthen your teeth and prepare them for another day of fighting acid.  If you develop radiation caries many times, the damage is so large that you will have to have all your teeth removed.  This could be a big problem if some of these teeth are in the field of radiation.  Further details of why this could be a big problem will follow.  (See Osteoradionecrosis).  [x]Dysgeusia (Loss of Taste) This happens to varying degrees once you've had radiation therapy to your jaw region.  Many times taste is not completely lost, but becomes limited.  The loss of taste is mostly due to radiation affecting your taste buds.   However, if you have no saliva in your mouth to carry the flavor to your taste buds, it would be difficult for your taste buds to taste anything.  That is why using water or a prescription for Salagen prior to meals and during meal times may help with some of the taste.  Keep in mind that taste generally returns very slowly over the course of several months or several years after radiation therapy.  Don't give up hope.  [x]Trismus (Limited Jaw Opening) According to your Radiation Oncologist, your TMJ or jaw joints are going to be partially or fully in the field of radiation.  This means that over time the muscles that help you open and close your mouth may get stiff.  This will potentially result in your not being able to open your mouth wide enough or as wide as you can open it now.    Let me give you an example of how slowly this happens and how unaware people are of it:   A gentlemen that had radiation therapy two years ago came back to me complaining that bananas are just too large for him to be able to fit them in between his teeth.  He was not able to open wide enough to bite into a banana.  This happens slowly and over a period of time.  What we do to try and prevent this:   . Your dentist will probably give you a stack of sticks called a trismus exercise device.  This stack will help remind your muscles and your jaw joints to open up to the same distance every day.  Use these sticks every morning when you wake up, or according to the instructions given by your dentist.    . You must use these sticks for at least one to two years after radiation therapy.  The reason for that is because it happens so slowly and keeps going on for about two years after radiation therapy.  Your hospital dentist will help you monitor your mouth opening and make sure that it's not getting smaller after radiation.  [x]Osteoradionecrosis (ORN) . This is a condition where your jaw bone after radiation therapy becomes  very dry.  It has very little blood supply to keep it alive.  If you develop a cavity that turns into an abscess or an infection, then the jaw bone does not have enough blood supply to help fight the infection.  At this point it is very likely that the infection could cause the death of your jaw bone.  When you have dead bone it has to be removed.  Therefore, you might end up having   to have surgery to remove part of your jaw bone, the part of the jaw bone that has been affected.    . Healing is also a problem if you are to have surgery (like a tooth extraction) in the areas where the bone has had radiation therapy.  If you have surgery, you need more blood supply to heal which is not available.  When blood supply and oxygen are not available, there is a chance for the bone to die. . Occasionally, ORN happens on its own with no obvious reason, but this is quite rare.  We believe that patients who continue to smoke and/or drink alcohol have a higher chance of having this problem. . Once your jaw bone has had radiation therapy, if there are any remaining teeth in that area, it is not recommended to have them pulled unless your dentist or oral surgeon is aware of your history of radiation and believes it is safe.  . The risks for ORN either from infection or spontaneously occurring (with no reason) are life long. 

## 2020-03-14 NOTE — Progress Notes (Signed)
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 TONGUE, LEFT, PARTIAL GLOSSECTOMY:  - Invasive moderately to poorly differentiated squamous cell carcinoma  - Carcinoma invades for depth of 0.5 cm  - Peripheral and deep resection margins are negative for carcinoma or  high-grade dysplasia  - Negative for lymphovascular invasion  - See oncology table  B. LYMPH NODES, LEVELS 1,2,3, MODIFIED LEFT NECK DISSECTION:  - Twenty-two lymph nodes with no metastatic carcinoma (0/22).   Pathologic Stage Classification (pTNM, AJCC 8th Edition): pT1, pN0   Nutrition Status Yes No Comments  Weight changes? []  [x]    Swallowing concerns? [x]  []  Since her surgery, left side of her tongue doesn't have feeling/function. She must chew/swallow on the right side of her mouth  PEG? []  [x]     Referrals Yes No Comments  Social Work? []  [x]    Dentistry? [x]  []  03/13/2020 Saw Dr. Sandi Mariscal  Swallowing therapy? [x]  []    Nutrition? [x]  []    Med/Onc? []  [x]     Safety Issues Yes No Comments  Prior radiation? []  [x]    Pacemaker/ICD? []  [x]    Possible current pregnancy? []  [x]    Is the patient on methotrexate? []  [x]     Tobacco/Marijuana/Snuff/ETOH use: Patient has never smoked, she does not drink alcohol, and denies any illicit drug use  Past/Anticipated interventions by otolaryngology, if any:  02/16/2020 Dr. [name] Left Partial Glossectomy Selective Left Neck Dissection, levels 1 2 and 3  Past/Anticipated interventions by medical oncology, if any:  No referral placed   Current Complaints / other details:  Recently founs out she has family history of oral cancer on her father's side (paternal aunt had mandibular cancer,  paternal grandmother had lip/oral cancer, paternal aunt had tongue cancer)

## 2020-03-15 ENCOUNTER — Encounter: Payer: Self-pay | Admitting: Radiation Oncology

## 2020-03-15 ENCOUNTER — Other Ambulatory Visit: Payer: Self-pay

## 2020-03-15 ENCOUNTER — Ambulatory Visit
Admission: RE | Admit: 2020-03-15 | Discharge: 2020-03-15 | Disposition: A | Discharge status: Home / Self Care | Payer: Medicare Other | Source: Ambulatory Visit | Attending: Radiation Oncology | Admitting: Radiation Oncology

## 2020-03-15 VITALS — BP 127/82 | HR 66 | Temp 98.1°F | Resp 18 | Ht 63.0 in | Wt 144.0 lb

## 2020-03-15 DIAGNOSIS — C021 Malignant neoplasm of border of tongue: Secondary | ICD-10-CM | POA: Diagnosis not present

## 2020-03-15 DIAGNOSIS — I499 Cardiac arrhythmia, unspecified: Secondary | ICD-10-CM | POA: Diagnosis not present

## 2020-03-15 DIAGNOSIS — Z79899 Other long term (current) drug therapy: Secondary | ICD-10-CM | POA: Diagnosis not present

## 2020-03-15 DIAGNOSIS — E7800 Pure hypercholesterolemia, unspecified: Secondary | ICD-10-CM | POA: Insufficient documentation

## 2020-03-15 DIAGNOSIS — E785 Hyperlipidemia, unspecified: Secondary | ICD-10-CM | POA: Diagnosis not present

## 2020-03-15 DIAGNOSIS — F419 Anxiety disorder, unspecified: Secondary | ICD-10-CM | POA: Insufficient documentation

## 2020-03-15 DIAGNOSIS — I10 Essential (primary) hypertension: Secondary | ICD-10-CM | POA: Diagnosis not present

## 2020-03-15 DIAGNOSIS — R911 Solitary pulmonary nodule: Secondary | ICD-10-CM | POA: Insufficient documentation

## 2020-03-15 DIAGNOSIS — C029 Malignant neoplasm of tongue, unspecified: Secondary | ICD-10-CM

## 2020-03-15 DIAGNOSIS — K219 Gastro-esophageal reflux disease without esophagitis: Secondary | ICD-10-CM | POA: Insufficient documentation

## 2020-03-15 DIAGNOSIS — R5381 Other malaise: Secondary | ICD-10-CM

## 2020-03-15 DIAGNOSIS — E871 Hypo-osmolality and hyponatremia: Secondary | ICD-10-CM | POA: Insufficient documentation

## 2020-03-15 DIAGNOSIS — Z9049 Acquired absence of other specified parts of digestive tract: Secondary | ICD-10-CM | POA: Diagnosis not present

## 2020-03-15 DIAGNOSIS — E041 Nontoxic single thyroid nodule: Secondary | ICD-10-CM | POA: Insufficient documentation

## 2020-03-15 DIAGNOSIS — D696 Thrombocytopenia, unspecified: Secondary | ICD-10-CM | POA: Insufficient documentation

## 2020-03-15 DIAGNOSIS — I4891 Unspecified atrial fibrillation: Secondary | ICD-10-CM | POA: Diagnosis not present

## 2020-03-15 DIAGNOSIS — Z7982 Long term (current) use of aspirin: Secondary | ICD-10-CM | POA: Diagnosis not present

## 2020-03-15 DIAGNOSIS — Z1329 Encounter for screening for other suspected endocrine disorder: Secondary | ICD-10-CM

## 2020-03-15 NOTE — Addendum Note (Signed)
Encounter addended by: Eppie Gibson, MD on: 03/15/2020 1:23 PM  Actions taken: Clinical Note Signed

## 2020-03-15 NOTE — Progress Notes (Signed)
See Consult Note from 03/13/20.

## 2020-03-15 NOTE — Progress Notes (Signed)
Oncology Nurse Navigator Documentation  Met with patient during initial consult with Dr. Isidore Moos. She was accompanied by her friend.  . Further introduced myself as her/their Navigator, explained my role as a member of the Care Team. . Provided New Patient Information packet: o Contact information for physician, this navigator, other members of the Care Team o Advance Directive information ([place] blue pamphlet with LCSW insert); provided [facility] AD booklet at his request,  o Fall Prevention Patient [place] sheet o Symptom Management [facility] information o [facility] map with highlight of [place] o SLP Information sheet . Assisted with post-consult appt scheduling. . I contacted Dr. Benson Norway regarding SPD's. She will only be able to fabricate the top guard. She will see Dr. Benson Norway before her CT simulation to obtain the SPD.  . I have notified her that her CT simulation is scheduled for 11/23 at 1:30 and she verbalized understanding.  . They verbalized understanding of information provided. . I encouraged them to call with questions/concerns moving forward.  Harlow Asa, RN, BSN, OCN Head & Neck Oncology Nurse [place] [facility] [place] [phone]

## 2020-03-15 NOTE — Progress Notes (Addendum)
Radiation Oncology         [phone] ________________________________  Initial Outpatient Consultation  Name: Erika [name] MRN: [id number]  Date: 03/15/2020  DOB: [birth year]  HU:[id number], [place], MD  [name], MD   REFERRING PHYSICIAN: [name], MD  DIAGNOSIS:    ICD-10-CM   1. Squamous cell carcinoma of lateral tongue (HCC)  C02.1    Cancer Staging Tongue cancer (HCC) Staging form: Oral Cavity, AJCC 8th Edition - Pathologic: Stage III (pT3, pN0, cM0) - Signed by Eppie Gibson, MD on 03/15/2020   CHIEF COMPLAINT: Here to discuss management of tongue cancer  HISTORY OF PRESENT ILLNESS::Erika [name] is a 74 y.o. female who presented with worsening left-sided tongue ulcer about 3 to 4 months ago. Biopsy of the tongue revealed invasive squamous cell carcinoma, "The carcinoma invades approximately 11 mm and extends to the edges of the biopsy material."  Pertinent imaging thus far includes CT scan of neck performed on 01/19/2020. The tongue lesion was not visible and there was artifact from dental amalgam that was limiting visualization of the entire tongue. However, there were no worrisome nodes in the neck.   PET scan on 01/22/2020 showed an asymmetric focus of increased FDG uptake within the left side of the tongue. There were no highly suspicious FDG avid lymph nodes to suggest nodal metastasis and no evidence for distant metastatic disease. There was, however, a non-specific, 5 mm, short axis, left, level 2 lymph node that exhibited mild FDG uptake. Finally, there was a ground-glass attenuating nodule in the left upper lobe that measured 4 mm and a 3 mm solid nodule in the left upper lobe; no follow-up was recommended.  Subsequently, the patient saw Dr. Constance Holster, who performed a left partial glossectomy and selective left neck dissection on the date of 02/16/2020. Pathology from the procedure revealed 1.1cm of invasive moderately to poorly differentiated squamous cell carcinoma  that invaded a depth of 0.5 cm. Peripheral and deep resection margins were negative for carcinoma or high-grade dysplasia. There was no lymphovascular invasion.  Twenty-two left neck lymph nodes were dissected and all were negative for metastatic carcinoma.   She was discussed at tumor board this week.  The consensus is that her staging should be pathologic T3N0 because of the depth of invasion that was found at her original biopsy.     Nutrition Status Yes No Comments  Weight changes? []  [x]    Swallowing concerns? [x]  []  Since her surgery, left side of her tongue doesn't have feeling/function. She must chew/swallow on the right side of her mouth  PEG? []  [x]     Referrals Yes No Comments  Social Work? []  [x]    Dentistry? [x]  []  03/13/2020 Saw Dr. Sandi Mariscal  Swallowing therapy? [x]  []    Nutrition? [x]  []    Med/Onc? []  [x]     Safety Issues Yes No Comments  Prior radiation? []  [x]    Pacemaker/ICD? []  [x]    Possible current pregnancy? []  [x]    Is the patient on methotrexate? []  [x]     Tobacco/Marijuana/Snuff/ETOH use: Patient has never smoked, she does not drink alcohol, and denies any illicit drug use   Past/Anticipated interventions by otolaryngology, if any:  02/16/2020 Dr. [name] Left Partial Glossectomy Selective Left Neck Dissection, levels 1 2 and 3  Current Complaints / other details:  Recently founds out she has family history of oral cancer on her father's side (paternal aunt had mandibular cancer, paternal grandmother had lip/oral cancer, paternal aunt had tongue cancer) She has some family  stressors right now.  She lives with her son who has a distant history of a head injury as well as his wife who has some cognitive issues of her own.   PREVIOUS RADIATION THERAPY: No  PAST MEDICAL HISTORY:  has a past medical history of A-fib ([place]), Adrenal nodule ([place]), Anxiety, Arthritis, Atrophic vaginitis, Chronic hyponatremia, Dysrhythmia, Female climacteric state, GERD  (gastroesophageal reflux disease), Headache, Hypercholesteremia, Hyperlipidemia, Hypertension, Leukopenia, PONV (postoperative nausea and vomiting), Recurrent UTI, Right thyroid nodule, Thrombocytopenia ([place]), and Tongue ulcer.    PAST SURGICAL HISTORY: Past Surgical History:  Procedure Laterality Date  . ESOPHAGOGASTRODUODENOSCOPY    . EXCISION OF TONGUE LESION N/A 02/16/2020   Procedure: EXCISION OF TONGUE LESION Partial Glossectomy;  Surgeon: [name], MD;  Location: [facility];  Service: ENT;  Laterality: N/A;  . HERNIA REPAIR     x2  groin  . JOINT REPLACEMENT    . PARTIAL GLOSSECTOMY  02/16/2020   EXCISION OF TONGUE LESION Partial Glossectomy (N/A Mouth)  . RADICAL NECK DISSECTION Left 02/16/2020   Procedure: RADICAL NECK DISSECTION Partial Left Neck Dissection;  Surgeon: [name], MD;  Location: [facility];  Service: ENT;  Laterality: Left;  . REPLACEMENT TOTAL KNEE     right knee  . SKIN CANCER EXCISION  05/2017   Excised from calf area  . TONSILLECTOMY    . TUBAL LIGATION      FAMILY HISTORY: family history includes CVA in her father; Emphysema in her brother and sister; Heart failure in her mother; Hypertension in her father and mother.  SOCIAL HISTORY:  reports that she has never smoked. She has never used smokeless tobacco. She reports that she does not drink alcohol and does not use drugs.  ALLERGIES: Hydrochlorothiazide, Atorvastatin, Codeine, and Strawberry flavor  MEDICATIONS:  Current Outpatient Medications  Medication Sig Dispense Refill  . acetaminophen (TYLENOL) 500 MG tablet Take 1,000 mg by mouth every 6 (six) hours as needed for moderate pain or headache.    Marland Kitchen amLODipine (NORVASC) 5 MG tablet Take 1 tablet (5 mg total) by mouth daily. Please make overdue appt with Dr. Tamala Julian before anymore refills. 3rd and Final Attempt (Patient taking differently: Take 5 mg by mouth at bedtime. Please make overdue appt with Dr. Tamala Julian before anymore refills. 3rd and Final Attempt)  15 tablet 0  . aspirin EC 81 MG tablet Take 81 mg by mouth daily.    . Calcium Carb-Cholecalciferol (CALCIUM 600 + D PO) Take 1 tablet by mouth daily.    . Carboxymethylcellulose Sodium (ARTIFICIAL TEARS OP) Place 1 drop into both eyes daily as needed (dry eyes).    . cetirizine (ZYRTEC) 10 MG tablet Take 10 mg by mouth daily.    . diclofenac Sodium (VOLTAREN) 1 % GEL Apply 1 application topically 4 (four) times daily as needed (pain).    Marland Kitchen esomeprazole (NEXIUM) 40 MG capsule Take 40 mg by mouth daily.  5  . famotidine (PEPCID) 20 MG tablet Take 20 mg by mouth at bedtime.     . fluticasone (FLONASE) 50 MCG/ACT nasal spray INSTILL 2 SPRAYS IN EACH NOSTRIL EVERY DAY. (Patient taking differently: Place 2 sprays into both nostrils daily. ) 16 g 3  . losartan (COZAAR) 100 MG tablet Take 1 tablet (100 mg total) by mouth daily. Please make overdue appt with Dr. Tamala Julian before anymore refills. 2nd attempt 15 tablet 0  . metoprolol tartrate (LOPRESSOR) 25 MG tablet Take 25 mg by mouth 2 (two) times daily.     Marland Kitchen  Multiple Vitamin (MULTIVITAMIN WITH MINERALS) TABS tablet Take 1 tablet by mouth daily.    . nitrofurantoin (MACRODANTIN) 50 MG capsule Take 50 mg by mouth at bedtime.     . Probiotic Product (RA PROBIOTIC GUMMIES) CHEW Chew 2 capsules by mouth daily.    . simvastatin (ZOCOR) 20 MG tablet Take 20 mg by mouth at bedtime.     . trolamine salicylate (ASPERCREME) 10 % cream Apply 1 application topically as needed for muscle pain.     No current facility-administered medications for this encounter.    REVIEW OF SYSTEMS:  Notable for that above.   PHYSICAL EXAM:  height is 5\' 3"  (1.6 m) and weight is 144 lb (65.3 kg). Her temperature is 98.1 F (36.7 C). Her blood pressure is 127/82 and her pulse is 66. Her respiration is 18 and oxygen saturation is 100%.   General: Alert and oriented, in no acute distress HEENT: Head is normocephalic. Extraocular movements are intact. Oropharynx is notable for no  lesions in the upper throat.  Notable for healing tissue at the underbelly of the left lateral tongue, status post partial glossectomy Neck: Neck is notable for no palpable adenopathy.  She has some excess tissue/swelling above her left neck surgical scar which is not a clinical concern. Heart: Regular in rate and rhythm with no murmurs, rubs, or gallops. Chest: Clear to auscultation bilaterally, with no rhonchi, wheezes, or rales. Abdomen: Soft, nontender, nondistended, with no rigidity or guarding. Neurologic: Cranial nerves II through XII are grossly intact. No obvious focalities. Speech is fluent. Coordination is intact. Psychiatric: Judgment and insight are intact. Affect is appropriate.   ECOG = 1  0 - Asymptomatic (Fully active, able to carry on all predisease activities without restriction)  1 - Symptomatic but completely ambulatory (Restricted in physically strenuous activity but ambulatory and able to carry out work of a light or sedentary nature. For example, light housework, office work)  2 - Symptomatic, <50% in bed during the day (Ambulatory and capable of all self care but unable to carry out any work activities. Up and about more than 50% of waking hours)  3 - Symptomatic, >50% in bed, but not bedbound (Capable of only limited self-care, confined to bed or chair 50% or more of waking hours)  4 - Bedbound (Completely disabled. Cannot carry on any self-care. Totally confined to bed or chair)  5 - Death   Eustace Pen MM, Creech RH, Tormey DC, et al. [phone]). "Toxicity and response criteria of the [facility]". [place] Oncol. 5 (6): 649-55   LABORATORY DATA:  Lab Results  Component Value Date   WBC 4.9 02/13/2020   HGB 12.4 02/13/2020   HCT 38.1 02/13/2020   MCV 93.2 02/13/2020   PLT 292 02/13/2020   CMP     Component Value Date/Time   NA 132 (L) 02/17/2020 1001   NA 134 05/26/2018 0926   K 3.4 (L) 02/17/2020 1001   CL 103 02/17/2020 1001    CO2 22 02/17/2020 1001   GLUCOSE 499 (H) 02/17/2020 1001   BUN 8 02/17/2020 1001   BUN 12 05/26/2018 0926   CREATININE 0.68 02/17/2020 1001   CALCIUM 7.7 (L) 02/17/2020 1001   PROT 4.4 (L) 10/14/2017 1355   ALBUMIN 2.4 (L) 10/14/2017 1355   AST 164 (H) 10/14/2017 1355   ALT 111 (H) 10/14/2017 1355   ALKPHOS 285 (H) 10/14/2017 1355   BILITOT 0.9 10/14/2017 1355   GFRNONAA >60 02/17/2020 1001  GFRAA 82 05/26/2018 0926      Lab Results  Component Value Date   TSH 0.369 10/12/2017     RADIOGRAPHY: DG Abd 1 View  Result Date: 02/16/2020 CLINICAL DATA:  NG tube placement EXAM: ABDOMEN - 1 VIEW COMPARISON:  None. FINDINGS: NG tube tip is in the mid to distal stomach. Nonobstructive bowel gas pattern. IMPRESSION: NG tube in the stomach. Electronically Signed   By: Rolm Baptise M.D.   On: 02/16/2020 17:52      IMPRESSION/PLAN:  This is a delightful patient with head and neck cancer, squamous cell carcinoma of the left oral tongue.  We discussed her pathology results at ear nose and throat tumor board and I reiterated the key points of the discussion to the patient and her friend today.  I would consider her cancer to the moderate risk because of the depth of invasion and the intermediate to poorly differentiated cells.  On the other hand, we discussed the favorable findings such as negative nodes, negative margins, negative lymphovascular space invasion.  The pathology report does not specify how wide her margins are.  In absence of this information, I still think that we have enough evidence to justify adjuvant radiation therapy to improve her likelihood of local regional control.    She would like to proceed with radiation therapy.  We talked about the standard fractionation approach versus hypofractionation.  The difference would be 6 weeks versus 4 weeks of treatment.  While the 6-week regimen is more standard, the 4-week regimen would probably be easier for her to tolerate.  I also  believe there is a lower likelihood that she would need a feeding tube if she undergoes a 4-week regimen.  She would like to try the 4-week regimen after we had a thorough discussion of her options.  We discussed the potential risks, benefits, and side effects of radiotherapy. We talked in detail about acute and late effects. We discussed that some of the most bothersome acute effects may be mucositis, dysgeusia, salivary changes, skin irritation, hair loss, dehydration, weight loss and fatigue. We talked about late effects which include but are not necessarily limited to dysphagia, hypothyroidism, nerve injury, vascular injury, spinal cord injury, xerostomia, jaw injury, dental issues, neck edema, and potential injury to any of the tissues in the head and neck region. No guarantees of treatment were given. A consent form was signed and placed in the patient's medical record. The patient is enthusiastic about proceeding with treatment. I look forward to participating in the patient's care.    Simulation (treatment planning) will take place next week after she is cleared by dentistry  We also discussed that the treatment of head and neck cancer is a multidisciplinary process to maximize treatment outcomes and quality of life. For this reason the following referrals have been or will be made:   Medical oncology to discuss chemotherapy    Dentistry for dental evaluation, possible extractions in the radiation fields, and /or advice on reducing risk of cavities, osteoradionecrosis, or other oral issues.   Nutritionist for nutrition support during and after treatment.   Speech language pathology for swallowing and/or speech therapy.   Social work for social support.    Physical therapy due to risk of lymphedema in neck and deconditioning.   Baseline labs including TSH.  Given her family history I contacted genetic counseling to see if an appointment is advised.  On date of service, in total, I  spent 65 minutes on this encounter. Patient  was seen in person.  __________________________________________   Eppie Gibson, MD  This document serves as a record of services personally performed by Eppie Gibson, MD. It was created on his behalf by Clerance Lav, a trained medical scribe. The creation of this record is based on the scribe's personal observations and the provider's statements to them. This document has been checked and approved by the attending provider.

## 2020-03-18 ENCOUNTER — Telehealth: Payer: Self-pay | Admitting: *Deleted

## 2020-03-18 NOTE — Telephone Encounter (Signed)
Called patient to inform of labs on 03-19-20 @ 1 pm, spoke with patient and she is aware of this appt.

## 2020-03-19 ENCOUNTER — Other Ambulatory Visit: Payer: Self-pay

## 2020-03-19 ENCOUNTER — Encounter: Payer: Self-pay | Admitting: General Practice

## 2020-03-19 ENCOUNTER — Ambulatory Visit (HOSPITAL_COMMUNITY): Payer: Dental | Admitting: Dentistry

## 2020-03-19 ENCOUNTER — Emergency Department (HOSPITAL_COMMUNITY)
Admission: EM | Admit: 2020-03-19 | Discharge: 2020-03-19 | Disposition: A | Discharge status: Home / Self Care | Payer: Medicare Other | Attending: Emergency Medicine | Admitting: Emergency Medicine

## 2020-03-19 ENCOUNTER — Ambulatory Visit
Admission: RE | Admit: 2020-03-19 | Discharge: 2020-03-19 | Disposition: A | Discharge status: Home / Self Care | Payer: Medicare Other | Source: Ambulatory Visit | Attending: Radiation Oncology | Admitting: Radiation Oncology

## 2020-03-19 DIAGNOSIS — C029 Malignant neoplasm of tongue, unspecified: Secondary | ICD-10-CM

## 2020-03-19 DIAGNOSIS — R55 Syncope and collapse: Secondary | ICD-10-CM

## 2020-03-19 DIAGNOSIS — Z1329 Encounter for screening for other suspected endocrine disorder: Secondary | ICD-10-CM

## 2020-03-19 DIAGNOSIS — Z85810 Personal history of malignant neoplasm of tongue: Secondary | ICD-10-CM | POA: Insufficient documentation

## 2020-03-19 DIAGNOSIS — I10 Essential (primary) hypertension: Secondary | ICD-10-CM | POA: Diagnosis not present

## 2020-03-19 DIAGNOSIS — K0510 Chronic gingivitis, plaque induced: Secondary | ICD-10-CM

## 2020-03-19 DIAGNOSIS — Z79899 Other long term (current) drug therapy: Secondary | ICD-10-CM | POA: Insufficient documentation

## 2020-03-19 DIAGNOSIS — Z96651 Presence of right artificial knee joint: Secondary | ICD-10-CM | POA: Diagnosis not present

## 2020-03-19 DIAGNOSIS — K030 Excessive attrition of teeth: Secondary | ICD-10-CM

## 2020-03-19 DIAGNOSIS — C021 Malignant neoplasm of border of tongue: Secondary | ICD-10-CM | POA: Diagnosis not present

## 2020-03-19 DIAGNOSIS — Z7982 Long term (current) use of aspirin: Secondary | ICD-10-CM | POA: Diagnosis not present

## 2020-03-19 DIAGNOSIS — Z20822 Contact with and (suspected) exposure to covid-19: Secondary | ICD-10-CM | POA: Diagnosis not present

## 2020-03-19 DIAGNOSIS — R000 Tachycardia, unspecified: Secondary | ICD-10-CM | POA: Diagnosis not present

## 2020-03-19 DIAGNOSIS — I959 Hypotension, unspecified: Secondary | ICD-10-CM | POA: Diagnosis not present

## 2020-03-19 DIAGNOSIS — K117 Disturbances of salivary secretion: Secondary | ICD-10-CM

## 2020-03-19 DIAGNOSIS — K06010 Localized gingival recession, unspecified: Secondary | ICD-10-CM | POA: Diagnosis not present

## 2020-03-19 DIAGNOSIS — R5381 Other malaise: Secondary | ICD-10-CM

## 2020-03-19 DIAGNOSIS — K08199 Complete loss of teeth due to other specified cause, unspecified class: Secondary | ICD-10-CM | POA: Diagnosis not present

## 2020-03-19 DIAGNOSIS — M270 Developmental disorders of jaws: Secondary | ICD-10-CM

## 2020-03-19 DIAGNOSIS — R0902 Hypoxemia: Secondary | ICD-10-CM | POA: Diagnosis not present

## 2020-03-19 LAB — CBC WITH DIFFERENTIAL/PLATELET
Abs Immature Granulocytes: 0.03 10*3/uL (ref 0.00–0.07)
Basophils Absolute: 0 10*3/uL (ref 0.0–0.1)
Basophils Relative: 0 %
Eosinophils Absolute: 0.1 10*3/uL (ref 0.0–0.5)
Eosinophils Relative: 2 %
HCT: 37.3 % (ref 36.0–46.0)
Hemoglobin: 12.1 g/dL (ref 12.0–15.0)
Immature Granulocytes: 0 %
Lymphocytes Relative: 15 %
Lymphs Abs: 1.2 10*3/uL (ref 0.7–4.0)
MCH: 29.9 pg (ref 26.0–34.0)
MCHC: 32.4 g/dL (ref 30.0–36.0)
MCV: 92.1 fL (ref 80.0–100.0)
Monocytes Absolute: 0.5 10*3/uL (ref 0.1–1.0)
Monocytes Relative: 6 %
Neutro Abs: 6.3 10*3/uL (ref 1.7–7.7)
Neutrophils Relative %: 77 %
Platelets: 270 10*3/uL (ref 150–400)
RBC: 4.05 MIL/uL (ref 3.87–5.11)
RDW: 12 % (ref 11.5–15.5)
WBC: 8.3 10*3/uL (ref 4.0–10.5)
nRBC: 0 % (ref 0.0–0.2)

## 2020-03-19 LAB — BASIC METABOLIC PANEL
Anion gap: 12 (ref 5–15)
BUN: 15 mg/dL (ref 8–23)
CO2: 22 mmol/L (ref 22–32)
Calcium: 9.6 mg/dL (ref 8.9–10.3)
Chloride: 98 mmol/L (ref 98–111)
Creatinine, Ser: 0.98 mg/dL (ref 0.44–1.00)
GFR, Estimated: 60 mL/min — ABNORMAL LOW (ref >60)
Glucose, Bld: 123 mg/dL — ABNORMAL HIGH (ref 70–99)
Potassium: 3.7 mmol/L (ref 3.5–5.1)
Sodium: 132 mmol/L — ABNORMAL LOW (ref 135–145)

## 2020-03-19 LAB — URINALYSIS, ROUTINE W REFLEX MICROSCOPIC
Bilirubin Urine: NEGATIVE
Glucose, UA: NEGATIVE mg/dL
Hgb urine dipstick: NEGATIVE
Ketones, ur: NEGATIVE mg/dL
Nitrite: NEGATIVE
Protein, ur: NEGATIVE mg/dL
Specific Gravity, Urine: 1.01 (ref 1.005–1.030)
pH: 6 (ref 5.0–8.0)

## 2020-03-19 LAB — RESPIRATORY PANEL BY RT PCR (FLU A&B, COVID)
Influenza A by PCR: NEGATIVE
Influenza B by PCR: NEGATIVE
SARS Coronavirus 2 by RT PCR: NEGATIVE

## 2020-03-19 LAB — CBG MONITORING, ED: Glucose-Capillary: 132 mg/dL — ABNORMAL HIGH (ref 70–99)

## 2020-03-19 LAB — TSH: TSH: 0.925 u[IU]/mL (ref 0.308–3.960)

## 2020-03-19 MED ORDER — SODIUM CHLORIDE 0.9 % IV BOLUS
1000.0000 mL | Freq: Once | INTRAVENOUS | Status: AC
  Administered 2020-03-19: 1000 mL via INTRAVENOUS

## 2020-03-19 MED ORDER — SODIUM CHLORIDE 0.9 % IV SOLN: INTRAVENOUS | Status: DC

## 2020-03-19 NOTE — Progress Notes (Signed)
[facility] CSW Progress Notes  Call to patient per referral as a new head/neck cancer patient.  Not at home, noted she is in [place].  Will call again tomorrow to complete social work assessment.  Edwyna Shell, LCSW Clinical Social Worker Phone:  [phone]

## 2020-03-19 NOTE — ED Triage Notes (Signed)
Pt arrives via  EMS from home with complaints of being weak. Pt sat down when family reports LOC for 10 seconds with tightness to arms and legs. Not postictal per EMS. Pt alert and oriented X4. Pt states before episode occurred she felt hot and nauseous  Initial BP with fire: 84/52.Marland Kitchen HR 130 EMS reports 162/74 .Marland Kitchen HR 60 RR 16 100% RA CBG 135.

## 2020-03-19 NOTE — Discharge Instructions (Addendum)
You have been evaluated for your passing out episode.  Fortunately your blood work, EKG, and evaluation did not find any concerning finding.  Follow-up closely with your doctor for further care.  Return if your symptoms worsen or if you have any other concern.

## 2020-03-19 NOTE — ED Notes (Signed)
Pt prefers not to be in a gown.

## 2020-03-19 NOTE — Progress Notes (Signed)
DENTAL VISIT PROGRESS NOTE   Patient Name:   Erika [name] Date of Birth:   1945-06-17 Medical Record Number: [id number]  ___________________________________  Dion Saucier presents for insertion of upper scatter protection device and trismus exercise applicance.  She has no complaints.  Vitals: BP (!) 155/76 (BP Location: Left Arm)   Pulse (!) 59   Temp 98.7 F (37.1 C)   Patient Active Problem List   Diagnosis Date Noted  . Tongue cancer ([place]) 02/16/2020  . Hyperlipidemia 12/14/2017  . Abnormal EKG 12/14/2017  . Thyroid nodule 12/14/2017  . A-fib ([place])   . AF (paroxysmal atrial fibrillation) ([place]) 10/15/2017  . Leukopenia 10/12/2017  . Thrombocytopenia ([place]) 10/12/2017  . Elevated transaminase level 10/12/2017  . Acute hyponatremia 10/12/2017  . Sepsis secondary to UTI ([place]) 10/12/2017  . Hypertension 10/12/2017  . Positive D dimer 10/12/2017  . Recurrent UTI 10/12/2017  . GERD (gastroesophageal reflux disease) 10/12/2017  . Arthritis 10/12/2017  . Tick bites 10/12/2017  . Status post total right knee replacement 03/31/2015  . Chronic cough 11/03/2012   Past Medical History:  Diagnosis Date  . A-fib ([place])   . Adrenal nodule ([place])   . Anxiety   . Arthritis   . Atrophic vaginitis   . Chronic hyponatremia   . Dysrhythmia   . Female climacteric state   . GERD (gastroesophageal reflux disease)   . Headache   . Hypercholesteremia   . Hyperlipidemia   . Hypertension   . Leukopenia   . PONV (postoperative nausea and vomiting)   . Recurrent UTI   . Right thyroid nodule   . Thrombocytopenia ([place])   . Tongue ulcer    Current Outpatient Medications  Medication Sig Dispense Refill  . acetaminophen (TYLENOL) 500 MG tablet Take 1,000 mg by mouth every 6 (six) hours as needed for moderate pain or headache.    Marland Kitchen amLODipine (NORVASC) 5 MG tablet Take 1 tablet (5 mg total) by mouth daily. Please make overdue appt with Dr. Tamala Julian before anymore refills. 3rd and Final  Attempt (Patient taking differently: Take 5 mg by mouth at bedtime. Please make overdue appt with Dr. Tamala Julian before anymore refills. 3rd and Final Attempt) 15 tablet 0  . aspirin EC 81 MG tablet Take 81 mg by mouth daily.    . Calcium Carb-Cholecalciferol (CALCIUM 600 + D PO) Take 1 tablet by mouth daily.    . Carboxymethylcellulose Sodium (ARTIFICIAL TEARS OP) Place 1 drop into both eyes daily as needed (dry eyes).    . cetirizine (ZYRTEC) 10 MG tablet Take 10 mg by mouth daily.    . diclofenac Sodium (VOLTAREN) 1 % GEL Apply 1 application topically 4 (four) times daily as needed (pain).    Marland Kitchen esomeprazole (NEXIUM) 40 MG capsule Take 40 mg by mouth daily.  5  . famotidine (PEPCID) 20 MG tablet Take 20 mg by mouth at bedtime.     . fluticasone (FLONASE) 50 MCG/ACT nasal spray INSTILL 2 SPRAYS IN EACH NOSTRIL EVERY DAY. (Patient taking differently: Place 2 sprays into both nostrils daily. ) 16 g 3  . losartan (COZAAR) 100 MG tablet Take 1 tablet (100 mg total) by mouth daily. Please make overdue appt with Dr. Tamala Julian before anymore refills. 2nd attempt 15 tablet 0  . metoprolol tartrate (LOPRESSOR) 25 MG tablet Take 25 mg by mouth 2 (two) times daily.     . Multiple Vitamin (MULTIVITAMIN WITH MINERALS) TABS tablet Take 1 tablet by mouth daily.    Marland Kitchen  nitrofurantoin (MACRODANTIN) 50 MG capsule Take 50 mg by mouth at bedtime.     . Probiotic Product (RA PROBIOTIC GUMMIES) CHEW Chew 2 capsules by mouth daily.    . simvastatin (ZOCOR) 20 MG tablet Take 20 mg by mouth at bedtime.     . trolamine salicylate (ASPERCREME) 10 % cream Apply 1 application topically as needed for muscle pain.     No current facility-administered medications for this visit.   Allergies  Allergen Reactions  . Hydrochlorothiazide Other (See Comments)    Brain swelling  . Atorvastatin Other (See Comments)    cramps  . Codeine Nausea And Vomiting  . Strawberry Flavor Itching    ASSESSMENT: Patient with recently diagnosed SCC  of the tongue anticipating radiation therapy.  Patient has heavily restored existing dentition.  PROCEDURE: Appliances were tried in and adjusted as needed. Polished. Trismus device was fabricated to 32 mm using tongue depressor sticks. Postop instructions were provided in verbal format concerning the use and care of appliances.  Counseling on the side effects of radiation related to the oral cavity was discussed with the patient.  PLAN: 1. Patient will return to our clinic following the completion on radiation for a follow-up appointment.  She will subsequently return to her regular dentist for routine dental care. 2. Patient to call if questions or problems arise before then.   Patient tolerated today's visit well and verbalized understanding of discussion and plan.  All questions and concerns were addressed, and she departed in stable condition.   [place] Benson [place], DMD

## 2020-03-19 NOTE — ED Provider Notes (Signed)
[facility] EMERGENCY DEPARTMENT Provider Note   CSN: [id number] Arrival date & time: 03/19/20  1913     History Chief Complaint  Patient presents with  . Loss of Consciousness    Erika [name] is a 74 y.o. female.  The history is provided by the patient and medical records. No language interpreter was used.  Loss of Consciousness    74 year old female significant history of atrial fibrillation not on any anticoagulant, recurrent headache, hypertension, brought here via EMS from home for evaluation of syncope.  History obtained through patient and through son who is at bedside.  Per son, tonight she was cooking dinner and was about to eat dinner however when she sat down in her chair she appears to be stiffened up, slumped over and had a brief syncopal episode lasting for a few seconds.  She came to and then shortly syncopized again for another few seconds and then came to.  She did not complain of any significant headache but did report feeling flushed and hot prior to passing out.  At this time she felt her normal self.  No headache, no vision changes, no neck pain chest pain trouble breathing abdominal pain back pain pain to extremities and having any focal numbness or weakness.  No tongue biting or bowel bladder incontinence.  She previously had tongue cancer status post surgical intervention by Dr. Constance Holster and today she checkup by the radiologist for preparation of radiation treatment.  States she had a head CT scan today as well.  When EMS arrived it was noted that her blood pressure was low at 84/52 and heart rate of 130.  Initial CBG was 135.  Past Medical History:  Diagnosis Date  . A-fib ([place])   . Adrenal nodule ([place])   . Anxiety   . Arthritis   . Atrophic vaginitis   . Chronic hyponatremia   . Dysrhythmia   . Female climacteric state   . GERD (gastroesophageal reflux disease)   . Headache   . Hypercholesteremia   . Hyperlipidemia   . Hypertension   .  Leukopenia   . PONV (postoperative nausea and vomiting)   . Recurrent UTI   . Right thyroid nodule   . Thrombocytopenia ([place])   . Tongue ulcer     Patient Active Problem List   Diagnosis Date Noted  . Tongue cancer ([place]) 02/16/2020  . Hyperlipidemia 12/14/2017  . Abnormal EKG 12/14/2017  . Thyroid nodule 12/14/2017  . A-fib ([place])   . AF (paroxysmal atrial fibrillation) ([place]) 10/15/2017  . Leukopenia 10/12/2017  . Thrombocytopenia ([place]) 10/12/2017  . Elevated transaminase level 10/12/2017  . Acute hyponatremia 10/12/2017  . Sepsis secondary to UTI ([place]) 10/12/2017  . Hypertension 10/12/2017  . Positive D dimer 10/12/2017  . Recurrent UTI 10/12/2017  . GERD (gastroesophageal reflux disease) 10/12/2017  . Arthritis 10/12/2017  . Tick bites 10/12/2017  . Status post total right knee replacement 03/31/2015  . Chronic cough 11/03/2012    Past Surgical History:  Procedure Laterality Date  . ESOPHAGOGASTRODUODENOSCOPY    . EXCISION OF TONGUE LESION N/A 02/16/2020   Procedure: EXCISION OF TONGUE LESION Partial Glossectomy;  Surgeon: [name], MD;  Location: [place];  Service: ENT;  Laterality: N/A;  . HERNIA REPAIR     x2  groin  . JOINT REPLACEMENT    . PARTIAL GLOSSECTOMY  02/16/2020   EXCISION OF TONGUE LESION Partial Glossectomy (N/A Mouth)  . RADICAL NECK DISSECTION Left 02/16/2020   Procedure: RADICAL  NECK DISSECTION Partial Left Neck Dissection;  Surgeon: [name], MD;  Location: [place];  Service: ENT;  Laterality: Left;  . REPLACEMENT TOTAL KNEE     right knee  . SKIN CANCER EXCISION  05/2017   Excised from calf area  . TONSILLECTOMY    . TUBAL LIGATION       OB History   No obstetric history on file.     Family History  Problem Relation Age of Onset  . Emphysema Sister   . Emphysema Brother   . Hypertension Mother   . Heart failure Mother   . Hypertension Father   . CVA Father     Social History   Tobacco Use  . Smoking status: Never Smoker    . Smokeless tobacco: Never Used  Vaping Use  . Vaping Use: Never used  Substance Use Topics  . Alcohol use: No  . Drug use: No    Home Medications Prior to Admission medications   Medication Sig Start Date End Date Taking? Authorizing Provider  acetaminophen (TYLENOL) 500 MG tablet Take 1,000 mg by mouth every 6 (six) hours as needed for moderate pain or headache.    [provider]  amLODipine (NORVASC) 5 MG tablet Take 1 tablet (5 mg total) by mouth daily. Please make overdue appt with Dr. Tamala Julian before anymore refills. 3rd and Final Attempt Patient taking differently: Take 5 mg by mouth at bedtime. Please make overdue appt with Dr. Tamala Julian before anymore refills. 3rd and Final Attempt 07/17/19   Belva Crome, MD  aspirin EC 81 MG tablet Take 81 mg by mouth daily.    [provider]  Calcium Carb-Cholecalciferol (CALCIUM 600 + D PO) Take 1 tablet by mouth daily.    [provider]  Carboxymethylcellulose Sodium (ARTIFICIAL TEARS OP) Place 1 drop into both eyes daily as needed (dry eyes).    [provider]  cetirizine (ZYRTEC) 10 MG tablet Take 10 mg by mouth daily.    [provider]  diclofenac Sodium (VOLTAREN) 1 % GEL Apply 1 application topically 4 (four) times daily as needed (pain).    [provider]  esomeprazole (NEXIUM) 40 MG capsule Take 40 mg by mouth daily. 01/20/18   [provider]  famotidine (PEPCID) 20 MG tablet Take 20 mg by mouth at bedtime.     [provider]  fluticasone (FLONASE) 50 MCG/ACT nasal spray INSTILL 2 SPRAYS IN EACH NOSTRIL EVERY DAY. Patient taking differently: Place 2 sprays into both nostrils daily.  09/05/13   Clance, Armando Reichert, MD  losartan (COZAAR) 100 MG tablet Take 1 tablet (100 mg total) by mouth daily. Please make overdue appt with Dr. Tamala Julian before anymore refills. 2nd attempt 08/01/19   Belva Crome, MD  metoprolol tartrate (LOPRESSOR) 25 MG tablet Take 25 mg by mouth 2 (two)  times daily.     [provider]  Multiple Vitamin (MULTIVITAMIN WITH MINERALS) TABS tablet Take 1 tablet by mouth daily.    [provider]  nitrofurantoin (MACRODANTIN) 50 MG capsule Take 50 mg by mouth at bedtime.     [provider]  Probiotic Product (RA PROBIOTIC GUMMIES) CHEW Chew 2 capsules by mouth daily.    [provider]  simvastatin (ZOCOR) 20 MG tablet Take 20 mg by mouth at bedtime.     [provider]  trolamine salicylate (ASPERCREME) 10 % cream Apply 1 application topically as needed for muscle pain.    [provider]  Allergies    Hydrochlorothiazide, Atorvastatin, Codeine, and Strawberry flavor  Review of Systems   Review of Systems  Cardiovascular: Positive for syncope.  All other systems reviewed and are negative.   Physical Exam Updated Vital Signs BP (!) 146/66 (BP Location: Right Arm)   Pulse 66   Temp 98.8 F (37.1 C) (Oral)   Resp 17   Ht 5\' 3"  (1.6 m)   Wt 65 kg   SpO2 100%   BMI 25.38 kg/m   Physical Exam Vitals and nursing note reviewed.  Constitutional:      General: She is not in acute distress.    Appearance: She is well-developed.  HENT:     Head: Atraumatic.     Mouth/Throat:     Mouth: Mucous membranes are moist.  Eyes:     Extraocular Movements: Extraocular movements intact.     Conjunctiva/sclera: Conjunctivae normal.     Pupils: Pupils are equal, round, and reactive to light.  Cardiovascular:     Rate and Rhythm: Normal rate and regular rhythm.     Pulses: Normal pulses.     Heart sounds: Normal heart sounds.  Pulmonary:     Effort: Pulmonary effort is normal.     Breath sounds: Normal breath sounds.  Abdominal:     Palpations: Abdomen is soft.     Tenderness: There is no abdominal tenderness.  Musculoskeletal:     Cervical back: Normal range of motion and neck supple.     Comments: 5 out of 5 strength to all 4 extremities  Skin:    Findings: No rash.    Neurological:     Mental Status: She is alert and oriented to person, place, and time.     GCS: GCS eye subscore is 4. GCS verbal subscore is 5. GCS motor subscore is 6.     Cranial Nerves: Cranial nerves are intact.  Psychiatric:        Mood and Affect: Mood normal.     ED Results / Procedures / Treatments   Labs (all labs ordered are listed, but only abnormal results are displayed) Labs Reviewed  BASIC METABOLIC PANEL - Abnormal; Notable for the following components:      Result Value   Sodium 132 (*)    Glucose, Bld 123 (*)    GFR, Estimated 60 (*)    All other components within normal limits  URINALYSIS, ROUTINE W REFLEX MICROSCOPIC - Abnormal; Notable for the following components:   Leukocytes,Ua TRACE (*)    Bacteria, UA RARE (*)    All other components within normal limits  CBG MONITORING, ED - Abnormal; Notable for the following components:   Glucose-Capillary 132 (*)    All other components within normal limits  RESPIRATORY PANEL BY RT PCR (FLU A&B, COVID)  CBC WITH DIFFERENTIAL/PLATELET    EKG EKG Interpretation  Date/Time:  Tuesday March 19 2020 19:51:44 EST Ventricular Rate:  71 PR Interval:    QRS Duration: 94 QT Interval:  367 QTC Calculation: 399 R Axis:   18 Text Interpretation: Sinus rhythm Low voltage, precordial leads No significant change since last tracing Confirmed by Blanchie Dessert [phone]) on 03/19/2020 9:22:27 PM   Radiology No results found.  Procedures Procedures (including critical care time)  Medications Ordered in ED Medications  sodium chloride 0.9 % bolus 1,000 mL (1,000 mLs Intravenous New Bag/Given 03/19/20 2016)    And  0.9 %  sodium chloride infusion (has no administration in time range)    ED Course  I have reviewed the triage vital signs and the nursing notes.  Pertinent labs & imaging results that were available during my care of the patient were reviewed by me and considered in my medical decision making (see  chart for details).    MDM Rules/Calculators/A&P                          BP (!) 146/66 (BP Location: Right Arm)   Pulse 66   Temp 98.8 F (37.1 C) (Oral)   Resp 17   Ht 5\' 3"  (1.6 m)   Wt 65 kg   SpO2 100%   BMI 25.38 kg/m   Final Clinical Impression(s) / ED Diagnoses Final diagnoses:  Vasovagal syncope    Rx / DC Orders ED Discharge Orders    None     8:43 PM Patient with history of tongue cancer who had a witnessed syncopal episode prior to arrival for.  She is back at baseline.  She was initially noted to have low blood pressure and tachycardia but that has since improved.  She is ambulating without difficulty.  She has no focal neuro deficit on exam.  She did not fall to the ground or injured any body part.  No complaints of headache.  Suspect vasovagal syncope.  Work-up initiated.  9:23 PM EKG without concerning arrhythmia or ischemic changes.  Glucose is 132.  Normal H&H, normal WBC, electrolyte panels are reassuring, UA without signs of urinary tract infection.  Normal orthostatic vital sign.  Care discussed with Dr. Maryan Rued who has evaluated patient and agrees that she is stable for discharge.   Domenic Moras, PA-C 03/19/20 2158    Blanchie Dessert, MD 03/20/20 [phone]

## 2020-03-20 NOTE — Progress Notes (Signed)
Oncology Nurse Navigator Documentation  To provide support, encouragement and care continuity, met with [name] during her CT SIM yesterday. She was accompanied by her friend. She tolerated procedure without difficulty, denied questions/concerns.   I toured them to treatment area, explained procedures for lobby registration, arrival to Radiation Waiting, arrival to tmt area and preparation for tmt.  She voiced understanding.   I encouraged him to call me prior to 04/01/20 [place].  Harlow Asa RN, BSN, OCN Head & Neck Oncology Nurse [place] at [facility] Phone # [phone]  Fax # [phone]

## 2020-03-22 ENCOUNTER — Encounter: Payer: Self-pay | Admitting: General Practice

## 2020-03-22 NOTE — Progress Notes (Signed)
[facility] [facility] Work  Initial Assessment   Erika [name] is a 74 y.o. year old female assessed by phone. Clinical Social Work was referred by treatment team for assessment of psychosocial needs.   SDOH (Social Determinants of Health) assessments performed: Yes   Distress Screen completed: Yes   ONCBCN DISTRESS SCREENING 03/15/2020  Screening Type Initial Screening  Distress experienced in past week (1-10) 4  Family Problem type Other (comment)  Emotional problem type Adjusting to appearance changes  Physical Problem type Pain  Physician notified of physical symptoms Yes  Referral to clinical psychology No  Referral to clinical social work No  Referral to financial advocate No  Referral to support programs No  Referral to palliative care No      Family/Social Information:  . Housing Arrangement: patient lives with son and daughter in [place] . Family members/support persons in your life? Son/daughter in [place], friends, neighbors, church . Transportation concerns: no . Employment: Retired  .  Income source: retirement funds . Financial concerns: No o Type of concern: None . Food access concerns: no . Religious or spiritual practice: regular church attender and part of a Bible study group . Medication Concerns: none . Services Currently in place: none at this time  Coping/ Adjustment to diagnosis: . Patient understands treatment plan and what happens next? Diagnosed with Stage 3 tongue cancer, had ulcer in mouth and discussed this w dentist.  "It kept getting worse."  Referred to additional specialists after having ear pain and knot in neck in addition to ulcer on tongue.  Lives w son who has a head injury and his wife - son and wife are able to help w transportation.  "I saw about 7 doctors before anyone figured it out."  Treatment plan includes 4 weeks of radiation, already had surgery on 02/16/20.  Is healing well, but parts of face and ear are numb, is concerned that she has  residual speech effects.  Will be scheduled to be seen in [facility]   Marland Kitchen Concerns about diagnosis and/or treatment: Pain or discomfort during procedures and radiation bothers me, I wonder if it will do more damage than good; "sounds like it is horrible", "They tell you the worse and hope it wont be that bad."  Teeth cannot be protected during radiation, concerned about inability to swallow during treatment, food will not taste good . Patient reported stressors: fear of radiation side effects, keeping nutrition up, oldest son had knee replacement recently, granddaughter has [place] Dun and has fallen recently, patient is close to this granddaughter . Hopes and priorities: family gatherings at holidays . Patient enjoys reading, time with family/ friends and cooking and eating, people "I am a people person", "I have a lot of friends and support, Bible study group and church . Current coping skills/ strengths: Communication skills, Religious Affiliation and Supportive family/friends    SUMMARY: Current SDOH Barriers:  . None  Interventions: . Discussed common feeling and emotions when being diagnosed with cancer, and the importance of support during treatment . Informed patient of the support team roles and support services at [facility] . Provided CSW contact information and encouraged patient to call with any questions or concerns   Follow Up Plan: Patient will contact CSW with any support or resource needs Patient verbalizes understanding of plan: Yes    Beverely Pace , LCSW

## 2020-03-28 ENCOUNTER — Ambulatory Visit: Payer: Medicare Other | Admitting: Radiation Oncology

## 2020-03-28 ENCOUNTER — Other Ambulatory Visit: Payer: Self-pay

## 2020-03-28 ENCOUNTER — Encounter: Payer: Self-pay | Admitting: General Practice

## 2020-03-28 ENCOUNTER — Ambulatory Visit: Payer: Medicare Other | Admitting: Physical Therapy

## 2020-03-28 ENCOUNTER — Ambulatory Visit: Payer: Medicare Other | Attending: Radiation Oncology

## 2020-03-28 ENCOUNTER — Encounter: Payer: Self-pay | Admitting: Physical Therapy

## 2020-03-28 DIAGNOSIS — I890 Lymphedema, not elsewhere classified: Secondary | ICD-10-CM | POA: Diagnosis not present

## 2020-03-28 DIAGNOSIS — R293 Abnormal posture: Secondary | ICD-10-CM

## 2020-03-28 DIAGNOSIS — C029 Malignant neoplasm of tongue, unspecified: Secondary | ICD-10-CM | POA: Insufficient documentation

## 2020-03-28 DIAGNOSIS — R1311 Dysphagia, oral phase: Secondary | ICD-10-CM | POA: Diagnosis not present

## 2020-03-28 DIAGNOSIS — M25612 Stiffness of left shoulder, not elsewhere classified: Secondary | ICD-10-CM | POA: Diagnosis not present

## 2020-03-28 NOTE — Therapy (Signed)
[place], [place], [address] Phone: [phone]   Fax:  [phone]  Physical Therapy Evaluation  Patient Details  Name: Erika [name] MRN: [id number] Date of Birth: 10-06-1945 Referring Provider (PT): Reita May Date: 03/28/2020   PT End of Session - 03/28/20 0848    Visit Number 1    Number of Visits 13    Date for PT Re-Evaluation 05/09/20    PT Start Time 0941    PT Stop Time 1020    PT Time Calculation (min) 39 min    Activity Tolerance Patient tolerated treatment well    Behavior During Therapy [facility] for tasks assessed/performed           Past Medical History:  Diagnosis Date  . A-fib ([place])   . Adrenal nodule ([place])   . Anxiety   . Arthritis   . Atrophic vaginitis   . Chronic hyponatremia   . Dysrhythmia   . Female climacteric state   . GERD (gastroesophageal reflux disease)   . Headache   . Hypercholesteremia   . Hyperlipidemia   . Hypertension   . Leukopenia   . PONV (postoperative nausea and vomiting)   . Recurrent UTI   . Right thyroid nodule   . Thrombocytopenia ([place])   . Tongue ulcer     Past Surgical History:  Procedure Laterality Date  . ESOPHAGOGASTRODUODENOSCOPY    . EXCISION OF TONGUE LESION N/A 02/16/2020   Procedure: EXCISION OF TONGUE LESION Partial Glossectomy;  Surgeon: [name], MD;  Location: [facility];  Service: ENT;  Laterality: N/A;  . HERNIA REPAIR     x2  groin  . JOINT REPLACEMENT    . PARTIAL GLOSSECTOMY  02/16/2020   EXCISION OF TONGUE LESION Partial Glossectomy (N/A Mouth)  . RADICAL NECK DISSECTION Left 02/16/2020   Procedure: RADICAL NECK DISSECTION Partial Left Neck Dissection;  Surgeon: [name], MD;  Location: [facility];  Service: ENT;  Laterality: Left;  . REPLACEMENT TOTAL KNEE     right knee  . SKIN CANCER EXCISION  05/2017   Excised from calf area  . TONSILLECTOMY    . TUBAL LIGATION      There were no vitals filed for  this visit.    Subjective Assessment - 03/28/20 0845    Subjective I am feeling okay.    Pertinent History invasive squamous cell carcinoma of tongue stage III; 9/24- CT neck, 9/27- PET, 10/22-left partial glossectomy and selective left neck dissection, 22 lymph nodes dissected and all were negative for metastatic carcinoma- will undergo radiation    Patient Stated Goals to gain info from providers    Currently in Pain? Yes    Pain Score 1     Pain Location Shoulder    Pain Orientation Right    Pain Descriptors / Indicators Discomfort    Pain Type Surgical pain    Pain Onset More than a month ago    Pain Frequency Intermittent    Aggravating Factors  moving shoulder    Pain Relieving Factors hot compress, aspercreme, muscle relaxers    Effect of Pain on Daily Activities unable to lift arm to brush hair              [facility] PT Assessment - 03/28/20 0001      Assessment   Medical Diagnosis SCC of tongue    Referring Provider (PT) Isidore Moos    Onset Date/Surgical Date 02/16/20    Hand Dominance Right  Prior Therapy had PT on left shoulder several years ago      Precautions   Precautions --   R TKA     Restrictions   Weight Bearing Restrictions No      Balance Screen   Has the patient fallen in the past 6 months No    Has the patient had a decrease in activity level because of a fear of falling?  No    Is the patient reluctant to leave their home because of a fear of falling?  No      Home Environment   Living Environment Private residence    Living Arrangements Children   son and his wife live with pt   Available Help at Discharge Family    Type of [place]      Prior Function   Level of [place] Retired    Leisure walks daily, uses foam roller on lower back to reduce pain      Cognition   Overall Cognitive Status Within Functional Limits for tasks assessed      Functional Tests   Functional tests Sit to Stand      Sit to Stand    Comments 10 reps in 30 seconds- 12 is average      Posture/Postural Control   Posture/Postural Control Postural limitations    Postural Limitations Rounded Shoulders;Forward head      ROM / Strength   AROM / PROM / Strength AROM      AROM   Overall AROM  Deficits    Overall AROM Comments L shoulder ROM: approx 80 degrees flexion 50 abduction - pt reports she has had rotator cuff problems on this side previously but was able to raise her arm prior to surgery    Cervical Flexion WFL    Cervical Extension WFL    Cervical - Right Side Bend 50% limited    Cervical - Left Side Bend 50% limited    Cervical - Right Rotation 25% limited    Cervical - Left Rotation 25% limited      Ambulation/Gait   Ambulation/Gait Yes    Ambulation/Gait Assistance 7: Independent    Ambulation Distance (Feet) 15 Feet    Gait Pattern Within Functional Limits    Ambulation Surface Level             LYMPHEDEMA/ONCOLOGY QUESTIONNAIRE - 03/28/20 0001      Lymphedema Assessments   Lymphedema Assessments Head and Neck      Head and Neck   4 cm superior to sternal notch around neck 33 cm    6 cm superior to sternal notch around neck 33 cm    8 cm superior to sternal notch around neck 34 cm                   Objective measurements completed on examination: See above findings.               PT Education - 03/28/20 0919    Education Details Neck ROM, importance of posture when sitting, standing and lying down, deep breathing, walking program and importance of staying active throughout treatment, CURE article on staying active, "Why exercise?" flyer, lymphedema and PT info    Person(s) Educated Patient    Methods Explanation;Handout    Comprehension Verbalized understanding               PT Long Term Goals - 03/28/20 0850      PT  LONG TERM GOAL #1   Title Pt will return to baseline ROM measurements and demonstrate no signs of lymphedema to allow pt to return to PLOF.     Time 6    Period Weeks    Status New    Target Date 05/09/20      PT LONG TERM GOAL #2   Title Pt will be able to raise her left arm up high enough to brush her hair.    Time 6    Period Weeks    Status New    Target Date 05/09/20      PT LONG TERM GOAL #3   Title Pt will be independent with self MLD for long term management of lymphedema of anterior neck.    Time 4    Period Weeks    Status New    Target Date 04/25/20      PT LONG TERM GOAL #4   Title Pt will be independent in a home exercise program for long term stretching and strengthening.    Time 6    Period Weeks    Status New    Target Date 05/09/20      PT LONG TERM GOAL #5   Title Pt will obtain appropriate compression garments for long term management of lymphedema.    Time 6    Period Weeks    Status New    Target Date 05/09/20              Head and Neck Clinic Goals - 03/28/20 0849      Patient will be able to verbalize understanding of a home exercise program for cervical range of motion, posture, and walking.    Time 1    Period Days    Status Achieved      Patient will be able to verbalize understanding of proper sitting and standing posture.    Period Days    Status Achieved      Patient will be able to verbalize understanding of lymphedema risk and availability of treatment for this condition.    Time 1    Period Days    Status Achieved              Plan - 03/28/20 0920    Clinical Impression Statement Pt presents to head and neck clinic with recently diagnosed squamous cell carcinoma of tongue. She had preexisting rotator cuff issues on the L side but reports after surgery her ROM worsened and she is no longer able to raise her arm to brush her hair. She also has some anterior neck swelling since the surgery. Educated pt about signs and symptoms of lymphedema as well as anatomy and physiology of lymphatic system. Educated pt in importance of staying as active as  possible throughout treatment to decrease fatigue as well as head and neck ROM exercises to decrease loss of ROM. Pt would benefit from skilled PT services to improve L shoulder ROM to improve function and decrease anterior neck swelling.    Examination-Activity Limitations Reach Overhead;Lift;Dressing    Examination-Participation Restrictions Cleaning;Community Activity    Stability/Clinical Decision Making Stable/Uncomplicated    Clinical Decision Making Low    Rehab Potential Excellent    PT Frequency 2x / week    PT Duration 6 weeks    PT Treatment/Interventions ADLs/Self Care Home Management;Patient/family education;Therapeutic exercise    PT Next Visit Plan measure shoulder ROM and add appropriate goals, give HEP for shoulder, MLD to anterior neck eventually instructing in  self MLD    PT Home Exercise Plan head and neck ROM exercises    Consulted and Agree with Plan of Care Patient           Patient will benefit from skilled therapeutic intervention in order to improve the following deficits and impairments:  Pain, Postural dysfunction, Impaired UE functional use, Increased fascial restricitons, Decreased strength, Decreased range of motion, Increased edema  Visit Diagnosis: Stiffness of left shoulder, not elsewhere classified - Plan: PT plan of care cert/re-cert  Lymphedema, not elsewhere classified - Plan: PT plan of care cert/re-cert  Abnormal posture - Plan: PT plan of care cert/re-cert  Squamous cell carcinoma of tongue ([place]) - Plan: PT plan of care cert/re-cert     Problem List Patient Active Problem List   Diagnosis Date Noted  . Tongue cancer ([place]) 02/16/2020  . Hyperlipidemia 12/14/2017  . Abnormal EKG 12/14/2017  . Thyroid nodule 12/14/2017  . A-fib ([place])   . AF (paroxysmal atrial fibrillation) ([place]) 10/15/2017  . Leukopenia 10/12/2017  . Thrombocytopenia ([place]) 10/12/2017  . Elevated transaminase level 10/12/2017  . Acute hyponatremia 10/12/2017  . Sepsis  secondary to UTI ([place]) 10/12/2017  . Hypertension 10/12/2017  . Positive D dimer 10/12/2017  . Recurrent UTI 10/12/2017  . GERD (gastroesophageal reflux disease) 10/12/2017  . Arthritis 10/12/2017  . Tick bites 10/12/2017  . Status post total right knee replacement 03/31/2015  . Chronic cough 11/03/2012    Allyson Sabal [facility] 03/28/2020, 11:54 AM  [place], [place], [address] Phone: [phone]   Fax:  [phone]  Name: Erika [name] MRN: [id number] Date of Birth: 1945/08/26  Manus Gunning, PT 03/28/20 11:54 AM

## 2020-03-28 NOTE — Therapy (Signed)
[facility] [address] [place], [place], [address] Phone: [phone]   Fax:  [phone]  Speech Language Pathology Evaluation  Patient Details  Name: Erika [name] MRN: [id number] Date of Birth: Sep 12, 1945 Referring Provider (SLP): Eppie Gibson, MD   Encounter Date: 03/28/2020   End of Session - 03/28/20 1310    Visit Number 1    Number of Visits 7    Date for SLP Re-Evaluation 06/26/20    SLP Start Time 0858    SLP Stop Time  0938    SLP Time Calculation (min) 40 min    Activity Tolerance Patient tolerated treatment well           Past Medical History:  Diagnosis Date  . A-fib ([place])   . Adrenal nodule ([place])   . Anxiety   . Arthritis   . Atrophic vaginitis   . Chronic hyponatremia   . Dysrhythmia   . Female climacteric state   . GERD (gastroesophageal reflux disease)   . Headache   . Hypercholesteremia   . Hyperlipidemia   . Hypertension   . Leukopenia   . PONV (postoperative nausea and vomiting)   . Recurrent UTI   . Right thyroid nodule   . Thrombocytopenia ([place])   . Tongue ulcer     Past Surgical History:  Procedure Laterality Date  . ESOPHAGOGASTRODUODENOSCOPY    . EXCISION OF TONGUE LESION N/A 02/16/2020   Procedure: EXCISION OF TONGUE LESION Partial Glossectomy;  Surgeon: [name], MD;  Location: [facility];  Service: ENT;  Laterality: N/A;  . HERNIA REPAIR     x2  groin  . JOINT REPLACEMENT    . PARTIAL GLOSSECTOMY  02/16/2020   EXCISION OF TONGUE LESION Partial Glossectomy (N/A Mouth)  . RADICAL NECK DISSECTION Left 02/16/2020   Procedure: RADICAL NECK DISSECTION Partial Left Neck Dissection;  Surgeon: [name], MD;  Location: [facility];  Service: ENT;  Laterality: Left;  . REPLACEMENT TOTAL KNEE     right knee  . SKIN CANCER EXCISION  05/2017   Excised from calf area  . TONSILLECTOMY    . TUBAL LIGATION      There were no vitals filed for this visit.       SLP  Evaluation [facility] - 03/28/20 6962      SLP Visit Information   SLP Received On 03/28/20    Referring Provider (SLP) Eppie Gibson, MD    Onset Date Spring-summer 2021    Medical Diagnosis SCCA of tongue (left)      Subjective   Patient/Family Stated Goal maintain WNL swallowing      Pain Assessment   Pain Score 4    in lt neck - soreness     General Information   HPI Ulcer on lt tongue for ~4 months. PET 01-22-20: level 2 lymphonode exhibited mild FDG uptake. 02-16-20 partial glossectomy and selective lt neck dissection. Biopsy path revealed 1.1 cm invasive mod-poorly differentiated SCCA. Plan to receive 20 fx of 2.5 Gy until 04-30-20. Pt's lt lateral tongue still without feeling necessitating chewing/swallowing on rt.       Prior Functional Status   Cognitive/Linguistic Baseline Within functional limits      Cognition   Overall Cognitive Status Within Functional Limits for tasks assessed      Auditory Comprehension   Overall Auditory Comprehension Appears within functional limits for tasks assessed      Oral Motor/Sensory Function   Overall Oral Motor/Sensory Function Impaired  Labial ROM Within Functional Limits    Labial Symmetry Within Functional Limits    Labial Coordination WFL    Lingual ROM Reduced left    Lingual Symmetry Abnormal symmetry left    Lingual Strength Reduced    Lingual Sensation Reduced Left    Lingual Coordination Reduced    Velum Within Functional Limits    Mandible --    Overall Oral Motor/Sensory Function Pt reports needing to use finger sweep rarely  if she doesn't place food on rt oral caviry wiht posterior lingual placement. Reports calcium vitamin is challenging. SLP suggested breaking up and/or using applesauce with or afterwards for assistance.       Motor Speech   Overall Motor Speech Impaired    Articulation Impaired   but 100% intelligible   Level of Impairment Word    Intelligibility Intelligible           Because data states the  risk for dysphagia during and after radiation treatment is high due to undergoing radiation tx, SLP taught pt about the possibility of reduced/limited ability for PO intake during rad tx. SLP encouraged pt to continue swallowing POs as far into rad tx as possible, even ingesting POs and/or completing HEP shortly after administration of pain meds. Among other modifications for days when pt cannot functionally swallow, SLP talked about performing only non-swallowing tasks on the handout/HEP, and then adding swallowing tasks back in when it becomes possible to do so.  SLP educated pt re: changes to swallowing musculature after rad tx, and why adherence to dysphagia HEP provided today and PO consumption was necessary to inhibit muscle fibrosis following rad tx. Pt demonstrated understanding of these things to SLP.    SLP then developed a HEP for pt and pt was instructed how to perform exercises involving lingual, vocal, and pharyngeal strengthening. SLP performed each exercise and pt return demonstrated each exercise. SLP ensured pt performance was correct prior to moving on to next exercise. Pt was instructed to complete this program 2 times a day, 6-7 days/week until 6 months after her last rad tx, then x2-3 a week after that.                  SLP Education - 03/28/20 1310    Education Details HEP procedure, tips for medication adminstration, late effects head/neck radiation on swallow function    Person(s) Educated Patient    Methods Explanation;Demonstration;Verbal cues;Handout    Comprehension Verbalized understanding;Returned demonstration;Verbal cues required;Need further instruction            SLP Short Term Goals - 03/28/20 1544      SLP SHORT TERM GOAL #1   Title pt will complete HEP with rare min A    Time 1    Period --   session/s, for all STGs   Status New      SLP SHORT TERM GOAL #2   Title pt will tell SLP why pt is completing HEP with modified independence     Time 2    Status New      SLP SHORT TERM GOAL #3   Title pt will tell SLP how a food journal could hasten return to a more normalized diet    Time 2    Status New      SLP SHORT TERM GOAL #4   Title pt will describe 3 overt s/s aspiration PNA with modified independence    Time 2    Status New  SLP Long Term Goals - 03/28/20 1545      SLP LONG TERM GOAL #1   Title pt will complete HEP with modified independence over two visits    Time 4    Period --   sessions, for all LTGs   Status New      SLP LONG TERM GOAL #2   Title pt will describe how to modify HEP over time, and the timeline associated with reduction in HEP frequency with modified independence over two sessions    Time 6    Status New            Plan - 03/28/20 0950    Clinical Impression Statement At this time pt swallowing is deemed [facility] with softer foods/thin liquids using small bites, chew on rt and PRN placing boluses on posterior rt lingual surface (including calcium vitamin). Pt is using these strategies independently, SLP only suggestion was that pt place her calcium vitamin on posterior right lingual surface after breaking in half, and maybe using applesauce to assist. SLP designed an individualized HEP for dysphagia and pt completed each exercise on their own with total cues faded to modified independent, except pt experienced most difficulty with pitch raise - req'd max A from SLP and still did not master this. SLP to monitor this.There are no overt s/s aspiration reported by pt at this time. Data indicate that pt's swallow ability will likely decrease over the course of radiation therapy and could very well decline over time following conclusion of their radiation therapy due to muscle disuse atrophy and/or muscle fibrosis. Pt will cont to need to be seen by SLP in order to assess safety of PO intake, assess the need for recommending any objective swallow assessment, and ensuring pt correctly completes  the individualized HEP.    Speech Therapy Frequency --   once every approx 4 weeks   Duration --   7 total sessions   Treatment/Interventions Aspiration precaution training;Pharyngeal strengthening exercises;Diet toleration management by SLP;Trials of upgraded texture/liquids;Patient/family education;Compensatory strategies;SLP instruction and feedback    Potential to Achieve Goals Good    SLP Home Exercise Plan provided    Consulted and Agree with Plan of Care Patient           Patient will benefit from skilled therapeutic intervention in order to improve the following deficits and impairments:   Dysphagia, oral phase    Problem List Patient Active Problem List   Diagnosis Date Noted  . Tongue cancer ([place]) 02/16/2020  . Hyperlipidemia 12/14/2017  . Abnormal EKG 12/14/2017  . Thyroid nodule 12/14/2017  . A-fib ([place])   . AF (paroxysmal atrial fibrillation) ([place]) 10/15/2017  . Leukopenia 10/12/2017  . Thrombocytopenia ([place]) 10/12/2017  . Elevated transaminase level 10/12/2017  . Acute hyponatremia 10/12/2017  . Sepsis secondary to UTI ([place]) 10/12/2017  . Hypertension 10/12/2017  . Positive D dimer 10/12/2017  . Recurrent UTI 10/12/2017  . GERD (gastroesophageal reflux disease) 10/12/2017  . Arthritis 10/12/2017  . Tick bites 10/12/2017  . Status post total right knee replacement 03/31/2015  . Chronic cough 11/03/2012    Chau Sawin ,MS, CCC-SLP  03/28/2020, 3:46 PM  [facility] [address] [place], [place], [address] Phone: [phone]   Fax:  [phone]  Name: Erika [name] MRN: [id number] Date of Birth: 10-Jan-1946

## 2020-03-28 NOTE — Patient Instructions (Addendum)
SWALLOWING EXERCISES Do these until 6 months after your last day of radiation, then 2-3 times per week afterwards  1. Effortful Swallows - Press your tongue against the roof of your mouth for 3 seconds, then squeeze the muscles in your neck while you swallow your saliva or a sip of water - Repeat 10-15 times, 2-3 times a day, and use whenever you eat or drink  2. Pitch Raise - Repeat "he", once per second in as high of a pitch as you can - Repeat 20 times, 2-3 times a day  3. Mendelsohn Maneuver - "half swallow" exercise - Start to swallow, and keep your Adam's apple up by squeezing hard with the muscles of the throat - Hold the squeeze for 5-7 seconds and then relax - Repeat 10-15 times, 2-3 times a day *use a wet spoon if your mouth gets dry*  4. Tongue Stretch/Teeth Clean - Move your tongue around the pocket between your gums and teeth, clockwise and then counter-clockwise - Repeat on the back side, clockwise and then counter-clockwise - Repeat 15-20 times, 2-3 times a day  5. Chin pushback - Open your mouth  - Place your fist UNDER your chin near your neck - Tuck your chin and push back with your fist for 5 seconds - Repeat 10 times, 2-3 times a day        6. "Super Swallow"  - Take a breath and hold it  - Bear down (like pushing your bowels)  - Swallow then IMMEDIATELY cough  - Repeat 10 times, 2-3 times a day

## 2020-03-28 NOTE — Progress Notes (Signed)
[place] CSW Progress Notes  Met w patient during Head and Neck MDC.  Provided information on Support Center and resources related to head and neck cancer.  Encouraged her to connect as needed for support/resources.  Patient is appreciative of support from multiple providers during her cancer journey.  She has support from family, however granddaughter had recent injury which has been a source of great concern to her.   Edwyna Shell, LCSW Clinical Social Worker Phone:  [phone]

## 2020-03-29 ENCOUNTER — Ambulatory Visit: Payer: Medicare Other | Admitting: Rehabilitation

## 2020-03-29 ENCOUNTER — Encounter: Payer: Self-pay | Admitting: Rehabilitation

## 2020-03-29 ENCOUNTER — Ambulatory Visit: Payer: Medicare Other

## 2020-03-29 DIAGNOSIS — I890 Lymphedema, not elsewhere classified: Secondary | ICD-10-CM

## 2020-03-29 DIAGNOSIS — R1311 Dysphagia, oral phase: Secondary | ICD-10-CM

## 2020-03-29 DIAGNOSIS — C029 Malignant neoplasm of tongue, unspecified: Secondary | ICD-10-CM

## 2020-03-29 DIAGNOSIS — R293 Abnormal posture: Secondary | ICD-10-CM

## 2020-03-29 DIAGNOSIS — C021 Malignant neoplasm of border of tongue: Secondary | ICD-10-CM | POA: Diagnosis not present

## 2020-03-29 DIAGNOSIS — M25612 Stiffness of left shoulder, not elsewhere classified: Secondary | ICD-10-CM

## 2020-03-29 NOTE — Patient Instructions (Signed)
Access Code: [email or web address]  URL: [email or web address]: 12/03/2021Prepared by: Marcene Brawn TevisExercises  Seated Shoulder Abduction AAROM with [place] - 1 x daily - 7 x weekly - 1 sets - 10 reps - no hold  Supine Shoulder Flexion AAROM with Hands Clasped - 1 x daily - 7 x weekly - 1 sets - 10 reps - no hold  Isometric Shoulder External Rotation - 1 x daily - 7 x weekly - 1 sets - 10 reps - 6 second hold  Isometric Shoulder Flexion - 1 x daily - 7 x weekly - 1 sets - 10 reps - 6 second hold

## 2020-03-29 NOTE — Therapy (Signed)
[place], [place], [address] Phone: [phone]   Fax:  [phone]  Physical Therapy Treatment  Patient Details  Name: Erika [name] MRN: [id number] Date of Birth: 1945/10/30 Referring Provider (PT): Reita May Date: 03/29/2020   PT End of Session - 03/29/20 1002    Visit Number 2    Number of Visits 13    Date for PT Re-Evaluation 05/09/20    PT Start Time 0904    PT Stop Time 0958    PT Time Calculation (min) 54 min    Activity Tolerance Patient tolerated treatment well    Behavior During Therapy [facility] for tasks assessed/performed           Past Medical History:  Diagnosis Date  . A-fib ([place])   . Adrenal nodule ([place])   . Anxiety   . Arthritis   . Atrophic vaginitis   . Chronic hyponatremia   . Dysrhythmia   . Female climacteric state   . GERD (gastroesophageal reflux disease)   . Headache   . Hypercholesteremia   . Hyperlipidemia   . Hypertension   . Leukopenia   . PONV (postoperative nausea and vomiting)   . Recurrent UTI   . Right thyroid nodule   . Thrombocytopenia ([place])   . Tongue ulcer     Past Surgical History:  Procedure Laterality Date  . ESOPHAGOGASTRODUODENOSCOPY    . EXCISION OF TONGUE LESION N/A 02/16/2020   Procedure: EXCISION OF TONGUE LESION Partial Glossectomy;  Surgeon: [name], MD;  Location: [place];  Service: ENT;  Laterality: N/A;  . HERNIA REPAIR     x2  groin  . JOINT REPLACEMENT    . PARTIAL GLOSSECTOMY  02/16/2020   EXCISION OF TONGUE LESION Partial Glossectomy (N/A Mouth)  . RADICAL NECK DISSECTION Left 02/16/2020   Procedure: RADICAL NECK DISSECTION Partial Left Neck Dissection;  Surgeon: [name], MD;  Location: [facility];  Service: ENT;  Laterality: Left;  . REPLACEMENT TOTAL KNEE     right knee  . SKIN CANCER EXCISION  05/2017   Excised from calf area  . TONSILLECTOMY    . TUBAL LIGATION      There were no vitals filed for this  visit.   Subjective Assessment - 03/29/20 0905    Subjective It is not hurting much it is just hard to move.  The muscles over there are sore and tight.    Pertinent History invasive squamous cell carcinoma of tongue stage III; 9/24- CT neck, 9/27- PET, 10/22-left partial glossectomy and selective left neck dissection, 22 lymph nodes dissected and all were negative for metastatic carcinoma- will undergo radiation    Currently in Pain? No/denies   it hurts 4-5/10 using the arm             [facility] PT Assessment - 03/29/20 0001      ROM / Strength   AROM / PROM / Strength PROM      AROM   AROM Assessment Site Shoulder    Right/Left Shoulder Right;Left    Left Shoulder Extension 43 Degrees    Left Shoulder Flexion 60 Degrees    Left Shoulder ABduction 45 Degrees    Left Shoulder Internal Rotation --   to belly   Left Shoulder External Rotation 45 Degrees   at neutral     PROM   Overall PROM Comments minimal pain end range    PROM Assessment Site Shoulder    Right/Left  Shoulder Left    Left Shoulder Flexion 140 Degrees    Left Shoulder ABduction 160 Degrees    Left Shoulder Internal Rotation --   to belly   Left Shoulder External Rotation 55 Degrees                         [facility] Adult PT Treatment/Exercise - 03/29/20 0001      Self-Care   Self-Care Other Self-Care Comments    Other Self-Care Comments  Discussed postural changes evident from surgical resection. Physiology of healing and how to focus on posture and muscle activation while avoiding pain for now.  Emphasized postural TE given yesterday at Head and neck clinic       Exercises   Exercises Shoulder      Shoulder Exercises: Supine   Flexion AAROM;Both;10 reps    Flexion Limitations hands clasped with vcs for initial performance and added to HEP.Also instructed to do this seated or standing      Shoulder Exercises: Seated   Elevation Both;5 reps    Retraction Both;10 reps    External Rotation Left;5  reps    External Rotation Limitations isometric activation using opposite hand with cueing for initial performance and added to HEP    Flexion 5 reps;Left    Flexion Limitations isometric activation using oposite hand and added to HEP      Shoulder Exercises: Standing   ABduction Left;AAROM;10 reps    ABduction Limitations with dowel       Manual Therapy   Manual Therapy Manual Lymphatic Drainage (MLD)    Manual therapy comments AROM/PROM taken today. Assessment of posture and musculature    Manual Lymphatic Drainage (MLD) brief education on neck and axillary circles along with milking the neck towards the axilla with written instructions on back of HEP                  PT Education - 03/29/20 1002    Education Details initial shoulder HEP, briefly MLD    Person(s) Educated Patient    Methods Explanation;Demonstration;Tactile cues;Verbal cues;Handout    Comprehension Verbalized understanding;Returned demonstration;Verbal cues required;Tactile cues required;Need further instruction               PT Long Term Goals - 03/29/20 1109      PT LONG TERM GOAL #1   Title Pt will return to baseline ROM measurements and demonstrate no signs of lymphedema to allow pt to return to PLOF.    Status On-going      PT LONG TERM GOAL #2   Title Pt will be able to raise her left arm up high enough to brush her hair.    Status On-going      PT LONG TERM GOAL #3   Title Pt will be independent with self MLD for long term management of lymphedema of anterior neck.    Status On-going      PT LONG TERM GOAL #4   Title Pt will be independent in a home exercise program for long term stretching and strengthening.    Status On-going      PT LONG TERM GOAL #5   Title Pt will obtain appropriate compression garments for long term management of lymphedema.    Status On-going                 Plan - 03/29/20 1002    Clinical Impression Statement Pt presents to first PT treatment  visit with more  thorough check of shoulder weakness today.  Pt demonstrates postural changes in the left shoulder of scapular protraction and anterior tilt leading to the left shoulder sitting lower and more forward as well as weakness in the UT with shoulder elevation demonstrating nerve palsy/injury from neck resection on the left.  Pt was given getle AAROM and isometric activation to start at home.  Brief discussion about self MLD for pt to practice for next visit before focusing on edema next session.    PT Frequency 2x / week    PT Duration 6 weeks    PT Treatment/Interventions ADLs/Self Care Home Management;Patient/family education;Therapeutic exercise;Manual lymph drainage;Compression bandaging;Manual techniques    PT Next Visit Plan MLD to anterior neck eventually instructing in self MLD, continue left shoulder postural activation for nerve damage    PT Home Exercise Plan head and neck ROM exercises, [id number] for shoulder    Consulted and Agree with Plan of Care Patient           Patient will benefit from skilled therapeutic intervention in order to improve the following deficits and impairments:     Visit Diagnosis: Stiffness of left shoulder, not elsewhere classified  Lymphedema, not elsewhere classified  Abnormal posture  Squamous cell carcinoma of tongue (HCC)  Dysphagia, oral phase     Problem List Patient Active Problem List   Diagnosis Date Noted  . Tongue cancer ([place]) 02/16/2020  . Hyperlipidemia 12/14/2017  . Abnormal EKG 12/14/2017  . Thyroid nodule 12/14/2017  . A-fib ([place])   . AF (paroxysmal atrial fibrillation) ([place]) 10/15/2017  . Leukopenia 10/12/2017  . Thrombocytopenia ([place]) 10/12/2017  . Elevated transaminase level 10/12/2017  . Acute hyponatremia 10/12/2017  . Sepsis secondary to UTI ([place]) 10/12/2017  . Hypertension 10/12/2017  . Positive D dimer 10/12/2017  . Recurrent UTI 10/12/2017  . GERD (gastroesophageal reflux disease) 10/12/2017  .  Arthritis 10/12/2017  . Tick bites 10/12/2017  . Status post total right knee replacement 03/31/2015  . Chronic cough 11/03/2012    Stark Bray 03/29/2020, 11:10 AM  [place], [place], [address] Phone: [phone]   Fax:  [phone]  Name: Erika [name] MRN: [id number] Date of Birth: 01/19/1946

## 2020-04-01 ENCOUNTER — Other Ambulatory Visit: Payer: Self-pay

## 2020-04-01 ENCOUNTER — Ambulatory Visit
Admission: RE | Admit: 2020-04-01 | Discharge: 2020-04-01 | Disposition: A | Discharge status: Home / Self Care | Payer: Medicare Other | Source: Ambulatory Visit | Attending: Radiation Oncology | Admitting: Radiation Oncology

## 2020-04-01 DIAGNOSIS — C029 Malignant neoplasm of tongue, unspecified: Secondary | ICD-10-CM | POA: Diagnosis not present

## 2020-04-01 DIAGNOSIS — C021 Malignant neoplasm of border of tongue: Secondary | ICD-10-CM | POA: Diagnosis not present

## 2020-04-01 MED ORDER — SONAFINE EX EMUL
1.0000 "application " | Freq: Two times a day (BID) | CUTANEOUS | Status: DC
  Administered 2020-04-01: 1 via TOPICAL

## 2020-04-01 NOTE — Progress Notes (Signed)
Pt here for patient teaching.    Pt given Radiation and You booklet, Managing Acute Radiation Side Effects for Head and Neck Cancer handout, skin care instructions and Sonafine.    Reviewed areas of pertinence such as fatigue, hair loss, mouth changes, skin changes, throat changes, earaches and taste changes .   Pt able to give teach back of to pat skin, use unscented/gentle soap and drink plenty of water,apply Sonafine bid and avoid applying anything to skin within 4 hours of treatment.   Pt demonstrated understanding and verbalizes understanding of information given and will contact nursing with any questions or concerns.    Http://rtanswers.org/treatmentinformation/whattoexpect/index          

## 2020-04-01 NOTE — Progress Notes (Signed)
Oncology Nurse Navigator Documentation  To provide support, encouragement and care continuity, met with Ms. Deterding for her initial RT.  She was accompanied by her friend  I reviewed the 2-step treatment process, answered questions.   Ms. Duarte completed treatment without difficulty, denied questions/concerns.  I reviewed the registration/arrival procedure for subsequent treatments.  I walked her around to the radiation clinic for her undertreat visit with Dr. Isidore Moos.   I encouraged them to call me with questions/concerns as tmts proceed.   Harlow Asa RN, BSN, OCN Head & Neck Oncology Nurse [facility] Phone # [phone]  Fax # [phone]

## 2020-04-02 ENCOUNTER — Ambulatory Visit
Admission: RE | Admit: 2020-04-02 | Discharge: 2020-04-02 | Disposition: A | Discharge status: Home / Self Care | Payer: Medicare Other | Source: Ambulatory Visit | Attending: Radiation Oncology | Admitting: Radiation Oncology

## 2020-04-02 DIAGNOSIS — C029 Malignant neoplasm of tongue, unspecified: Secondary | ICD-10-CM | POA: Diagnosis not present

## 2020-04-02 DIAGNOSIS — C021 Malignant neoplasm of border of tongue: Secondary | ICD-10-CM | POA: Diagnosis not present

## 2020-04-03 ENCOUNTER — Inpatient Hospital Stay: Payer: Medicare Other | Attending: Radiation Oncology | Admitting: Nutrition

## 2020-04-03 ENCOUNTER — Encounter: Payer: Medicare Other | Admitting: Nutrition

## 2020-04-03 ENCOUNTER — Ambulatory Visit
Admission: RE | Admit: 2020-04-03 | Discharge: 2020-04-03 | Disposition: A | Discharge status: Home / Self Care | Payer: Medicare Other | Source: Ambulatory Visit | Attending: Radiation Oncology | Admitting: Radiation Oncology

## 2020-04-03 DIAGNOSIS — C029 Malignant neoplasm of tongue, unspecified: Secondary | ICD-10-CM | POA: Diagnosis not present

## 2020-04-03 DIAGNOSIS — C021 Malignant neoplasm of border of tongue: Secondary | ICD-10-CM | POA: Diagnosis not present

## 2020-04-03 NOTE — Progress Notes (Signed)
Telephone appointment completed with patient with new diagnosis of tongue cancer status post left partial glossectomy and left neck dissection.  She is followed by Dr. Isidore Moos and will be receiving radiation therapy with her final radiation therapy appointment scheduled for January 4.  Past medical history includes postop nausea vomiting, hypertension, hyperlipidemia, hypercholesterolemia, GERD, A. fib, and anxiety.  Medications include Nexium, Pepcid, multivitamin.  Labs include sodium 132, glucose 123 on November 23.  Height: 5 feet 3 inches. Weight: 144.2 pounds. Usual body weight: 150 pounds. BMI: 25.51.  Patient reports since her surgery, her tongue does not work correctly. She tries to eat on the right side. Reports speech therapist provided her with exercises. She currently denies difficulty consuming her normal diet. She denies nutrition impact symptoms at this time.  Nutrition diagnosis: Food and nutrition related knowledge deficit related to tongue cancer and associated treatments as evidenced by no prior need for nutrition related information.  Intervention: Educated patient on the importance of weight maintenance throughout treatment by consuming adequate calories and protein. Brief education provided on strategies to improve dry mouth/thick saliva. Educated patient on sources of protein in her diet. I have mailed fact sheets to patient including increasing calories and protein, high-protein foods, high-calorie high-protein snacks, and dry mouth. Provided contact information for RD.  Monitoring, evaluation, goals: Patient will contact RD with questions or concerns.  RD will make follow-up telephone calls with patient weekly.  **Disclaimer: This note was dictated with voice recognition software. Similar sounding words can inadvertently be transcribed and this note may contain transcription errors which may not have been corrected upon publication of note.**

## 2020-04-04 ENCOUNTER — Ambulatory Visit
Admission: RE | Admit: 2020-04-04 | Discharge: 2020-04-04 | Disposition: A | Discharge status: Home / Self Care | Payer: Medicare Other | Source: Ambulatory Visit | Attending: Radiation Oncology | Admitting: Radiation Oncology

## 2020-04-04 DIAGNOSIS — C021 Malignant neoplasm of border of tongue: Secondary | ICD-10-CM | POA: Diagnosis not present

## 2020-04-04 DIAGNOSIS — C029 Malignant neoplasm of tongue, unspecified: Secondary | ICD-10-CM | POA: Diagnosis not present

## 2020-04-05 ENCOUNTER — Ambulatory Visit
Admission: RE | Admit: 2020-04-05 | Discharge: 2020-04-05 | Disposition: A | Discharge status: Home / Self Care | Payer: Medicare Other | Source: Ambulatory Visit | Attending: Radiation Oncology | Admitting: Radiation Oncology

## 2020-04-05 DIAGNOSIS — C021 Malignant neoplasm of border of tongue: Secondary | ICD-10-CM | POA: Diagnosis not present

## 2020-04-05 DIAGNOSIS — C029 Malignant neoplasm of tongue, unspecified: Secondary | ICD-10-CM | POA: Diagnosis not present

## 2020-04-08 ENCOUNTER — Other Ambulatory Visit: Payer: Self-pay

## 2020-04-08 ENCOUNTER — Ambulatory Visit
Admission: RE | Admit: 2020-04-08 | Discharge: 2020-04-08 | Disposition: A | Discharge status: Home / Self Care | Payer: Medicare Other | Source: Ambulatory Visit | Attending: Radiation Oncology | Admitting: Radiation Oncology

## 2020-04-08 ENCOUNTER — Other Ambulatory Visit: Payer: Self-pay | Admitting: Radiation Oncology

## 2020-04-08 DIAGNOSIS — C021 Malignant neoplasm of border of tongue: Secondary | ICD-10-CM | POA: Diagnosis not present

## 2020-04-08 DIAGNOSIS — C029 Malignant neoplasm of tongue, unspecified: Secondary | ICD-10-CM | POA: Diagnosis not present

## 2020-04-08 MED ORDER — LIDOCAINE VISCOUS HCL 2 % MT SOLN: OROMUCOSAL | 5 refills | Status: DC

## 2020-04-08 MED ORDER — HYDROCODONE-ACETAMINOPHEN 7.5-325 MG/15ML PO SOLN: 10.0000 mL | ORAL | 0 refills | Status: DC | PRN

## 2020-04-09 ENCOUNTER — Ambulatory Visit: Payer: Medicare Other | Admitting: Rehabilitation

## 2020-04-09 ENCOUNTER — Encounter: Payer: Self-pay | Admitting: Rehabilitation

## 2020-04-09 ENCOUNTER — Telehealth: Payer: Self-pay | Admitting: Dietician

## 2020-04-09 ENCOUNTER — Ambulatory Visit
Admission: RE | Admit: 2020-04-09 | Discharge: 2020-04-09 | Disposition: A | Discharge status: Home / Self Care | Payer: Medicare Other | Source: Ambulatory Visit | Attending: Radiation Oncology | Admitting: Radiation Oncology

## 2020-04-09 DIAGNOSIS — I890 Lymphedema, not elsewhere classified: Secondary | ICD-10-CM | POA: Diagnosis not present

## 2020-04-09 DIAGNOSIS — M25612 Stiffness of left shoulder, not elsewhere classified: Secondary | ICD-10-CM | POA: Diagnosis not present

## 2020-04-09 DIAGNOSIS — C029 Malignant neoplasm of tongue, unspecified: Secondary | ICD-10-CM | POA: Diagnosis not present

## 2020-04-09 DIAGNOSIS — R293 Abnormal posture: Secondary | ICD-10-CM | POA: Diagnosis not present

## 2020-04-09 DIAGNOSIS — R1311 Dysphagia, oral phase: Secondary | ICD-10-CM | POA: Diagnosis not present

## 2020-04-09 DIAGNOSIS — C021 Malignant neoplasm of border of tongue: Secondary | ICD-10-CM | POA: Diagnosis not present

## 2020-04-09 NOTE — Telephone Encounter (Signed)
Telephone follow-up appointment completed with patient with new diagnosis of tongue cancer status post left partial glossectomy and left neck dissection. She is followed by Dr. Isidore Moos for radiation therapy, final treatment scheduled January 4.  Medications: Calcium 600 + D, Voltaren, MVI, Probiotic, Pepicd  No new labs for review   Weight 143 lbs 4.8 oz on 03/19/20  Patient reports new mouth sores this week, using salt water/baking soda rinse as well as Lidocaine mouthwash. She denies weight loss, and continuing to eat well, blending pure protein supplement with ice cream for extra calories and protein. Patient reports drinking Gatorade (16 oz) daily for low sodium. She denies receiving the educational handouts that were mailed out to her on initial nutrition assessment. RD confirmed address, will resend today.   Nutrition diagnosis: Food and nutrition related knowledge deficit related to tongue cancer and associated treatments as evidenced by no prior need for nutrition related information. -ongoing  Intervention: Educated pt on trying soft and moist foods Discussed strategies for increasing calories and protein Encouraged pt to increase oral supplements if unable to maintain regular diet due to mouth soreness Fact sheets including soft and moist high protein menu ideas, easy to chew and swallow foods, tips for increasing calories and protein, and coupons will be mailed to patient today Provided contact information for RD  Monitoring, evaluation, goals: Patient will contact RD with questions or concerns. RD will follow-up via telephone call with patient weekly. Next follow-up December 21st 1:00 pm  Lajuan Lines, RD, LDN Clinical Nutrition After Hours/Weekend Pager # in [place]

## 2020-04-09 NOTE — Therapy (Signed)
[place], [place], [address] Phone: [phone]   Fax:  [phone]  Physical Therapy Treatment  Patient Details  Name: Erika [name] MRN: [id number] Date of Birth: [birth year] Referring Provider (PT): Reita May Date: 04/09/2020   PT End of Session - 04/09/20 1603    Visit Number 3    Number of Visits 13    Date for PT Re-Evaluation 05/09/20    PT Start Time 1500    PT Stop Time 1545    PT Time Calculation (min) 45 min    Activity Tolerance Patient tolerated treatment well    Behavior During Therapy [facility] for tasks assessed/performed           Past Medical History:  Diagnosis Date   A-fib ([place])    Adrenal nodule ([place])    Anxiety    Arthritis    Atrophic vaginitis    Chronic hyponatremia    Dysrhythmia    Female climacteric state    GERD (gastroesophageal reflux disease)    Headache    Hypercholesteremia    Hyperlipidemia    Hypertension    Leukopenia    PONV (postoperative nausea and vomiting)    Recurrent UTI    Right thyroid nodule    Thrombocytopenia ([place])    Tongue ulcer     Past Surgical History:  Procedure Laterality Date   ESOPHAGOGASTRODUODENOSCOPY     EXCISION OF TONGUE LESION N/A 02/16/2020   Procedure: EXCISION OF TONGUE LESION Partial Glossectomy;  Surgeon: [name], MD;  Location: [facility];  Service: ENT;  Laterality: N/A;   HERNIA REPAIR     x2  groin   JOINT REPLACEMENT     PARTIAL GLOSSECTOMY  02/16/2020   EXCISION OF TONGUE LESION Partial Glossectomy (N/A Mouth)   RADICAL NECK DISSECTION Left 02/16/2020   Procedure: RADICAL NECK DISSECTION Partial Left Neck Dissection;  Surgeon: [name], MD;  Location: [facility];  Service: ENT;  Laterality: Left;   REPLACEMENT TOTAL KNEE     right knee   SKIN CANCER EXCISION  05/2017   Excised from calf area   TONSILLECTOMY     TUBAL LIGATION      There were no vitals filed for  this visit.   Subjective Assessment - 04/09/20 1500    Subjective My mouth is already sore from starting radiation.  It already started.    Pertinent History invasive squamous cell carcinoma of tongue stage III; 9/24- CT neck, 9/27- PET, 10/22-left partial glossectomy and selective left neck dissection, 22 lymph nodes dissected and all were negative for metastatic carcinoma- will undergo radiation    Patient Stated Goals to gain info from providers    Currently in Pain? Yes    Pain Score 4     Pain Location Throat    Pain Orientation Right    Pain Descriptors / Indicators Burning    Pain Type Surgical pain    Pain Onset More than a month ago    Pain Frequency Intermittent                             [facility] Adult PT Treatment/Exercise - 04/09/20 0001      Exercises   Exercises Neck      Neck Exercises: Supine   Neck Retraction 10 reps    Cervical Rotation 10 reps    Shoulder Flexion 5 reps;Both    Shoulder Flexion Limitations alternating  Other Supine Exercise meeks extension press 5" x 6    Other Supine Exercise bil shoulder ER x 10 with retraction focus      Shoulder Exercises: Seated   Row 10 reps    Other Seated Exercises shoulder elevation x 10 bil to focus on UT activation      Manual Therapy   Manual Lymphatic Drainage (MLD) in supine HOB elevated; short neck, bil axillary nodes, anterior neck Lt>Rt and submental region working towards bil axillae.  Pt does not seem tender or red yet from radiation but is starting to have mouth sores and pain with talking                       PT Long Term Goals - 03/29/20 1109      PT LONG TERM GOAL #1   Title Pt will return to baseline ROM measurements and demonstrate no signs of lymphedema to allow pt to return to PLOF.    Status On-going      PT LONG TERM GOAL #2   Title Pt will be able to raise her left arm up high enough to brush her hair.    Status On-going      PT LONG TERM GOAL #3    Title Pt will be independent with self MLD for long term management of lymphedema of anterior neck.    Status On-going      PT LONG TERM GOAL #4   Title Pt will be independent in a home exercise program for long term stretching and strengthening.    Status On-going      PT LONG TERM GOAL #5   Title Pt will obtain appropriate compression garments for long term management of lymphedema.    Status On-going                 Plan - 04/09/20 1603    Clinical Impression Statement Pt demonstrates improved Lt shoulder mobility and less pain improved from last session.  Pt has had about 1 week of radiation to the BOT and is starting to have some mouth sores.  Skin is still not tender or red but we discussed one more visit and then pt focusing on postural and shoulder exercises at home which she is doing well with and abbreviated MLD of bil axillary nodes, clavicular nodes, and milking the anterior left neck until return post radiation.  Overall mild fluctuating edema above the dissection incision.    PT Frequency 2x / week    PT Duration 6 weeks    PT Treatment/Interventions ADLs/Self Care Home Management;Patient/family education;Therapeutic exercise;Manual lymph drainage;Compression bandaging;Manual techniques    PT Next Visit Plan one more visit before radiation hold; check shoulder AROM, review postural exercise/neck clinic TE for rest of radiation, MLD to anterior left neck    PT Home Exercise Plan head and neck ROM exercises, [id number] for shoulder    Consulted and Agree with Plan of Care Patient           Patient will benefit from skilled therapeutic intervention in order to improve the following deficits and impairments:     Visit Diagnosis: Lymphedema, not elsewhere classified  Abnormal posture  Stiffness of left shoulder, not elsewhere classified  Squamous cell carcinoma of tongue ([place])     Problem List Patient Active Problem List   Diagnosis Date Noted   Tongue cancer  ([place]) 02/16/2020   Hyperlipidemia 12/14/2017   Abnormal EKG 12/14/2017   Thyroid nodule 12/14/2017  A-fib (HCC)    AF (paroxysmal atrial fibrillation) ([place]) 10/15/2017   Leukopenia 10/12/2017   Thrombocytopenia ([place]) 10/12/2017   Elevated transaminase level 10/12/2017   Acute hyponatremia 10/12/2017   Sepsis secondary to UTI ([place]) 10/12/2017   Hypertension 10/12/2017   Positive D dimer 10/12/2017   Recurrent UTI 10/12/2017   GERD (gastroesophageal reflux disease) 10/12/2017   Arthritis 10/12/2017   Tick bites 10/12/2017   Status post total right knee replacement 03/31/2015   Chronic cough 11/03/2012    Stark Bray 04/09/2020, 4:07 PM  [place], [place], [address] Phone: [phone]   Fax:  [phone]  Name: Erika [name] MRN: [id number] Date of Birth: [birth year]

## 2020-04-10 ENCOUNTER — Ambulatory Visit
Admission: RE | Admit: 2020-04-10 | Discharge: 2020-04-10 | Disposition: A | Discharge status: Home / Self Care | Payer: Medicare Other | Source: Ambulatory Visit | Attending: Radiation Oncology | Admitting: Radiation Oncology

## 2020-04-10 ENCOUNTER — Other Ambulatory Visit: Payer: Self-pay | Admitting: Radiation Oncology

## 2020-04-10 DIAGNOSIS — C021 Malignant neoplasm of border of tongue: Secondary | ICD-10-CM | POA: Diagnosis not present

## 2020-04-10 DIAGNOSIS — C029 Malignant neoplasm of tongue, unspecified: Secondary | ICD-10-CM | POA: Diagnosis not present

## 2020-04-10 MED ORDER — HYDROCODONE-ACETAMINOPHEN 7.5-325 MG/15ML PO SOLN: 10.0000 mL | ORAL | 0 refills | Status: DC | PRN

## 2020-04-11 ENCOUNTER — Ambulatory Visit
Admission: RE | Admit: 2020-04-11 | Discharge: 2020-04-11 | Disposition: A | Discharge status: Home / Self Care | Payer: Medicare Other | Source: Ambulatory Visit | Attending: Radiation Oncology | Admitting: Radiation Oncology

## 2020-04-11 ENCOUNTER — Other Ambulatory Visit: Payer: Self-pay

## 2020-04-11 ENCOUNTER — Ambulatory Visit: Payer: Medicare Other

## 2020-04-11 DIAGNOSIS — I890 Lymphedema, not elsewhere classified: Secondary | ICD-10-CM | POA: Diagnosis not present

## 2020-04-11 DIAGNOSIS — M25612 Stiffness of left shoulder, not elsewhere classified: Secondary | ICD-10-CM | POA: Diagnosis not present

## 2020-04-11 DIAGNOSIS — C021 Malignant neoplasm of border of tongue: Secondary | ICD-10-CM | POA: Diagnosis not present

## 2020-04-11 DIAGNOSIS — R293 Abnormal posture: Secondary | ICD-10-CM | POA: Diagnosis not present

## 2020-04-11 DIAGNOSIS — C029 Malignant neoplasm of tongue, unspecified: Secondary | ICD-10-CM

## 2020-04-11 DIAGNOSIS — R1311 Dysphagia, oral phase: Secondary | ICD-10-CM | POA: Diagnosis not present

## 2020-04-11 NOTE — Patient Instructions (Addendum)
1. Decompression Exercise     Cancer Rehab [phone]    Lie on back on firm surface, knees bent, feet flat, arms turned up, out to sides, backs of hands down. Time _5-15__ minutes. Surface: floor   2. Shoulder Press    Start in Decompression Exercise position. Press shoulders downward towards supporting surface. Hold __2-3__ seconds while counting out loud. Repeat _3-5___ times. Do _1-2___ times per day.   3. Head Press    Bring cervical spine (neck) into neutral position (by either tucking the chin towards the chest or tilting the chin upward). Feel weight on back of head. Press head downward into supporting surface.    Hold _2-3__ seconds. Repeat _3-5__ times. Do _1-2__ times per day.   4. Leg Lengthener    Straighten one leg. Pull toes AND forefoot toward knee, extend heel. Lengthen leg by pulling pelvis away from ribs. Hold _2-3__ seconds. Relax. Repeat __4-6__ times. Do other leg.  Surface: floor   5. Leg Press    Straighten one leg down to floor keeping leg aligned with hip. Pull toes AND forefoot toward knee; extend heel.  Press entire leg downward (as if pressing leg into sandy beach). DO NOT BEND KNEE. Hold _2-3__ seconds. Do __4-6__ times. Repeat with other leg.    Lateralization    Move jaw out to right, then to left side. Do not pull back. Hold __5__ seconds. Repeat __5__ times. Do _2-3___ sessions per day.  Depression: Center Complete    Slowly lower jaw without pulling jaw in toward neck. Hold _5-10___ seconds. Repeat __5__ times. Do __2-3__ sessions per day.    Also try "KISSING THE SKY" by puckering lips and gently jutting to ceiling Hold 5 sec, repeat 5-10 times, 2-3 times/day.  Manual Lymph Drainage for face/neck  1) Place hands on either side of neck and do 15-20 stationary circles  2) Place hands on areas just above collar bones and do 15-20 stationary circles 3) Do stationary circles on both shoulders (about 10 times) 4) Do stationary  circles at both armpit areas (about 10 times) 5) Take 5 deep breaths 6) Do stationary circles on each side of the neck just below the jaw line (15-20) 7) Do stationary circles with flat hand over throat with right hand to the right and down, then same with left to left. (15-20) 8) Do downward circles just underneath the chin (15-20) 9) Do stationary circles on each side of the face on the cheeks just above the jaw line (15-20) 10) Do stationary downward circles on each side of the face between the eyes and ears (15-20) 11) Repeat step 1, 2, 3, and 4  Cancer Rehab [phone]    Do not slide on the skin Only give enough pressure to stretch the skin Make sure to always wash your hands prior to massage

## 2020-04-11 NOTE — Therapy (Signed)
[place], [place], [address] Phone: [phone]   Fax:  [phone]  Physical Therapy Treatment  Patient Details  Name: Erika [name] MRN: [id number] Date of Birth: 1946-04-05 Referring Provider (PT): Reita May Date: 04/11/2020   PT End of Session - 04/11/20 1012    Visit Number 4    Number of Visits 13    Date for PT Re-Evaluation 05/09/20    PT Start Time 0901    PT Stop Time 1006    PT Time Calculation (min) 65 min    Activity Tolerance Patient tolerated treatment well    Behavior During Therapy [name] for tasks assessed/performed           Past Medical History:  Diagnosis Date  . A-fib ([place])   . Adrenal nodule ([place])   . Anxiety   . Arthritis   . Atrophic vaginitis   . Chronic hyponatremia   . Dysrhythmia   . Female climacteric state   . GERD (gastroesophageal reflux disease)   . Headache   . Hypercholesteremia   . Hyperlipidemia   . Hypertension   . Leukopenia   . PONV (postoperative nausea and vomiting)   . Recurrent UTI   . Right thyroid nodule   . Thrombocytopenia ([place])   . Tongue ulcer     Past Surgical History:  Procedure Laterality Date  . ESOPHAGOGASTRODUODENOSCOPY    . EXCISION OF TONGUE LESION N/A 02/16/2020   Procedure: EXCISION OF TONGUE LESION Partial Glossectomy;  Surgeon: [name], MD;  Location: [facility];  Service: ENT;  Laterality: N/A;  . HERNIA REPAIR     x2  groin  . JOINT REPLACEMENT    . PARTIAL GLOSSECTOMY  02/16/2020   EXCISION OF TONGUE LESION Partial Glossectomy (N/A Mouth)  . RADICAL NECK DISSECTION Left 02/16/2020   Procedure: RADICAL NECK DISSECTION Partial Left Neck Dissection;  Surgeon: [name], MD;  Location: [facility];  Service: ENT;  Laterality: Left;  . REPLACEMENT TOTAL KNEE     right knee  . SKIN CANCER EXCISION  05/2017   Excised from calf area  . TONSILLECTOMY    . TUBAL LIGATION      There were no vitals filed for  this visit.   Subjective Assessment - 04/11/20 0903    Subjective I started having sores under my tongue as well and my doctor prescribed me a hydrocortisone rinse and that helps briefly but it's my mouth so with the saliva nothing lasts long.    Pertinent History invasive squamous cell carcinoma of tongue stage III; 9/24- CT neck, 9/27- PET, 10/22-left partial glossectomy and selective left neck dissection, 22 lymph nodes dissected and all were negative for metastatic carcinoma- will undergo radiation    Currently in Pain? No/denies   tongue always burns                            [facility] Adult PT Treatment/Exercise - 04/11/20 0001      Exercises   Exercises Other Exercises    Other Exercises  Instructed pt Erika [name] Decompression exercises having her return each demo and repeat 5x holding for 5 sec each      Manual Therapy   Manual Therapy Soft tissue mobilization;Manual Lymphatic Drainage (MLD);Passive ROM    Soft tissue mobilization During MLD to Lt cervical muscles, UT and TMJ area to tolerance and suboccipital release then resumed MLD after; brief trial of STM  to Lt lateral cheek with glove on with thumb on inner cheek and finger on outside of cheek and gently pulling from TMJ towards front of mouth. Instructed pt in this to try after radiation and cheek is healed.    Manual Lymphatic Drainage (MLD) in supine HOB slightly elevated; short and long neck, bil shoulder collectors, bil axillary nodes, 5 dipahragmatic breaths, anterior neck Lt>Rt and submental region working towards bil axillae, bil masseters, and bil preauricaular nodes, paused sequence for STM, then retraced all steps educating pt throughout and had her return demo of retracing all steps after therapist. She benefitted from hand over hand pressure for correct skin stretch/no slide but did well with light pressure.    Passive ROM In Supine during STM into Rt side bend and rotation to tolerance.                   PT Education - 04/11/20 1008    Education Details Self MLD, jaw A/ROM and Erika [name] Decompression exs    Person(s) Educated Patient    Methods Explanation;Demonstration;Handout    Comprehension Verbalized understanding;Returned demonstration;Need further instruction;Tactile cues required               PT Long Term Goals - 03/29/20 1109      PT LONG TERM GOAL #1   Title Pt will return to baseline ROM measurements and demonstrate no signs of lymphedema to allow pt to return to PLOF.    Status On-going      PT LONG TERM GOAL #2   Title Pt will be able to raise her left arm up high enough to brush her hair.    Status On-going      PT LONG TERM GOAL #3   Title Pt will be independent with self MLD for long term management of lymphedema of anterior neck.    Status On-going      PT LONG TERM GOAL #4   Title Pt will be independent in a home exercise program for long term stretching and strengthening.    Status On-going      PT LONG TERM GOAL #5   Title Pt will obtain appropriate compression garments for long term management of lymphedema.    Status On-going                 Plan - 04/11/20 1013    Clinical Impression Statement Continued with manual therapy to focus on reducing Lt cervical tightness and anterior throat lymphedema. Also instructed pt in self manual lymph drainage while performing then had her return demo of each step. Pt was able to verbalize good understading of sequence and after cuing she was perforrming with improved technique but will benefit from further review of this if she returns after radiation. Progressed HEP to include Erika [name] Decompression exs which she returned a good demo of. Also issued gentle jaw ROM exs of opening jaw, and lateralization for prolonged holds. Tried gentle STM at Lt lateral cheek but pt very reddened from radiation so encourgaed her to try this after radiation and cheek healed. She verbalized understanding all and  reports her nedk feeling much better and looser at end of session. She is on hold until completing radiation and will call to R/S at that time.    Examination-Activity Limitations Reach Overhead;Lift;Dressing    Examination-Participation Restrictions Cleaning;Community Activity    Stability/Clinical Decision Making Stable/Uncomplicated    Rehab Potential Excellent    PT Frequency 2x / week    PT Duration  6 weeks    PT Treatment/Interventions ADLs/Self Care Home Management;Patient/family education;Therapeutic exercise;Manual lymph drainage;Compression bandaging;Manual techniques    PT Next Visit Plan On hold until after radiation; check shoulder AROM, review postural, neck and TMJ HEP, cont MLD to anterior left neck and review this pt to assess technique    PT Home Exercise Plan head and neck ROM exercises, [id number] for shoulder; self MLD, Erika [name] Decompression Exs and TMJ A/ROM    Consulted and Agree with Plan of Care Patient           Patient will benefit from skilled therapeutic intervention in order to improve the following deficits and impairments:  Pain,Postural dysfunction,Impaired UE functional use,Increased fascial restricitons,Decreased strength,Decreased range of motion,Increased edema  Visit Diagnosis: Lymphedema, not elsewhere classified  Abnormal posture  Stiffness of left shoulder, not elsewhere classified  Squamous cell carcinoma of tongue ([place])     Problem List Patient Active Problem List   Diagnosis Date Noted  . Tongue cancer ([place]) 02/16/2020  . Hyperlipidemia 12/14/2017  . Abnormal EKG 12/14/2017  . Thyroid nodule 12/14/2017  . A-fib ([place])   . AF (paroxysmal atrial fibrillation) ([place]) 10/15/2017  . Leukopenia 10/12/2017  . Thrombocytopenia ([place]) 10/12/2017  . Elevated transaminase level 10/12/2017  . Acute hyponatremia 10/12/2017  . Sepsis secondary to UTI ([place]) 10/12/2017  . Hypertension 10/12/2017  . Positive D dimer 10/12/2017  . Recurrent UTI  10/12/2017  . GERD (gastroesophageal reflux disease) 10/12/2017  . Arthritis 10/12/2017  . Tick bites 10/12/2017  . Status post total right knee replacement 03/31/2015  . Chronic cough 11/03/2012    Otelia Limes, PTA 04/11/2020, 10:31 AM  [place], [place], [address] Phone: [phone]   Fax:  [phone]  Name: Erika [name] MRN: [id number] Date of Birth: [birth year]

## 2020-04-12 ENCOUNTER — Ambulatory Visit
Admission: RE | Admit: 2020-04-12 | Discharge: 2020-04-12 | Disposition: A | Discharge status: Home / Self Care | Payer: Medicare Other | Source: Ambulatory Visit | Attending: Radiation Oncology | Admitting: Radiation Oncology

## 2020-04-12 DIAGNOSIS — C029 Malignant neoplasm of tongue, unspecified: Secondary | ICD-10-CM | POA: Diagnosis not present

## 2020-04-12 DIAGNOSIS — C021 Malignant neoplasm of border of tongue: Secondary | ICD-10-CM | POA: Diagnosis not present

## 2020-04-13 IMAGING — US US FNA BIOPSY THYROID 1ST LESION
1 series · 13 of 20 positions shown · non-contrast
Comparison: US thyroid 10/12/17

INDICATION: Indeterminate thyroid nodule of right inferior lobe. Request for
fine needle aspiration of right inferior indeterminate thyroid
nodule.

EXAM:
ULTRASOUND GUIDED FINE NEEDLE ASPIRATION OF INDETERMINATE THYROID
NODULE
TECHNIQUE: Informed written consent was obtained from the patient after a
discussion of the risks, benefits and alternatives to treatment.
Questions regarding the procedure were encouraged and answered. A
timeout was performed prior to the initiation of the procedure.

[Series 1: us fna biopsy thyroid 1st lesion · 0.06mm/px · 20 acquisitions, 13 frames shown]
[im 1/20]
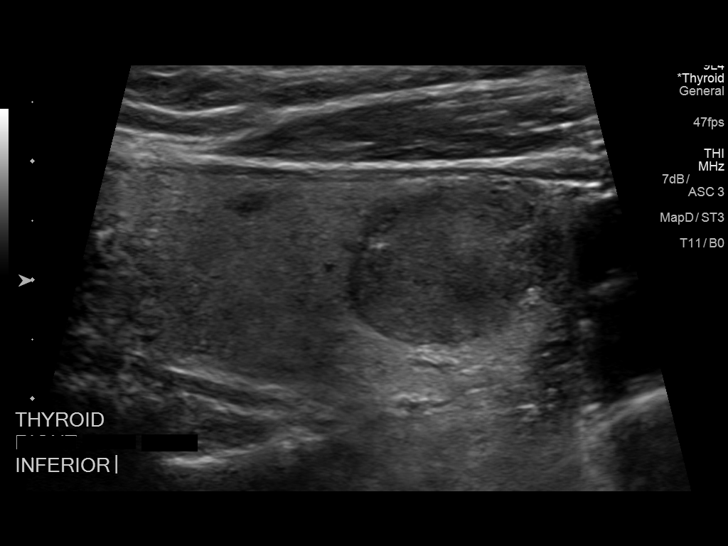
[im 3/20]
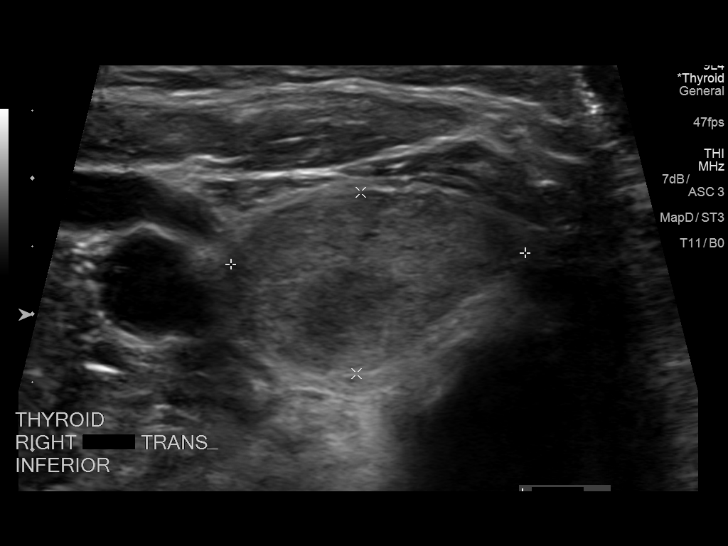
[im 4/20]
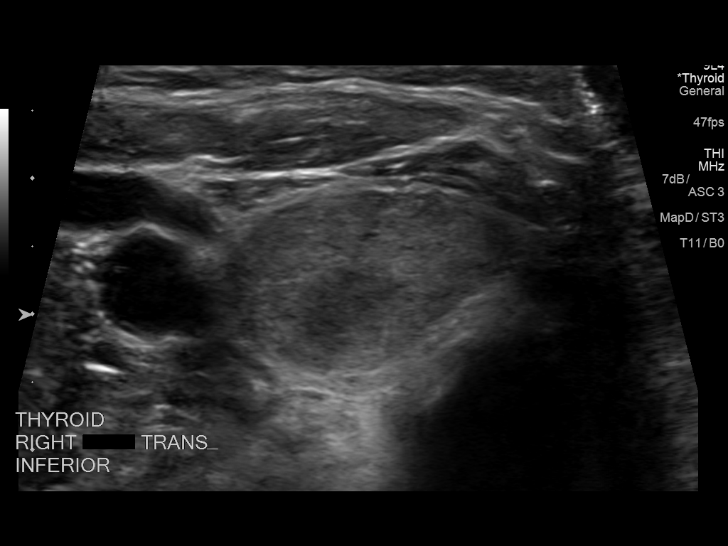
[im 6/20]
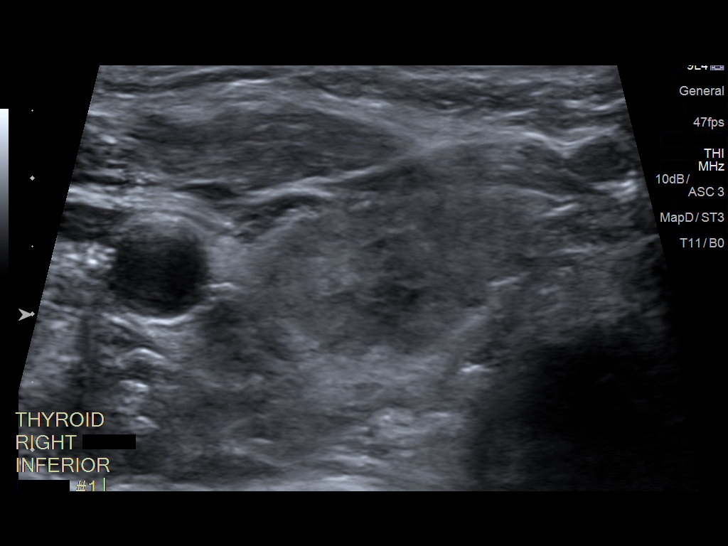
[im 7/20]
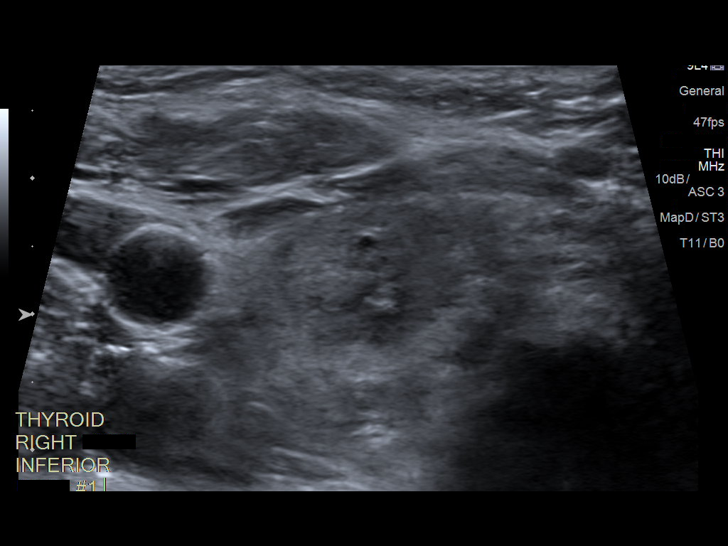
[im 9/20]
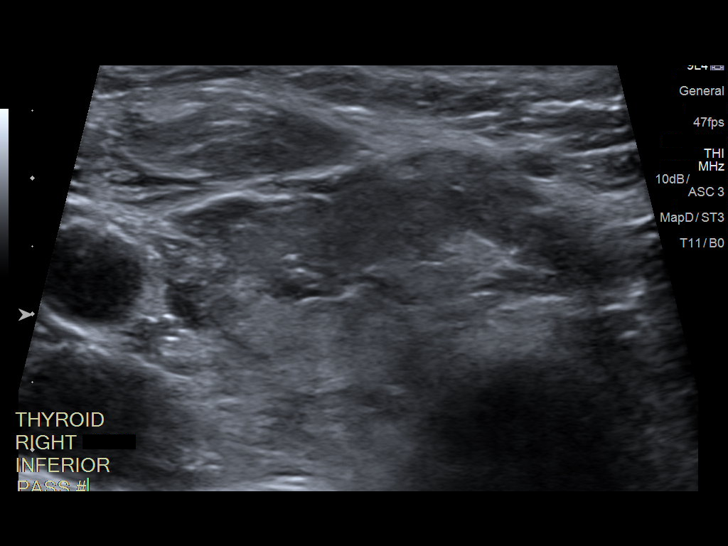
[im 11/20]
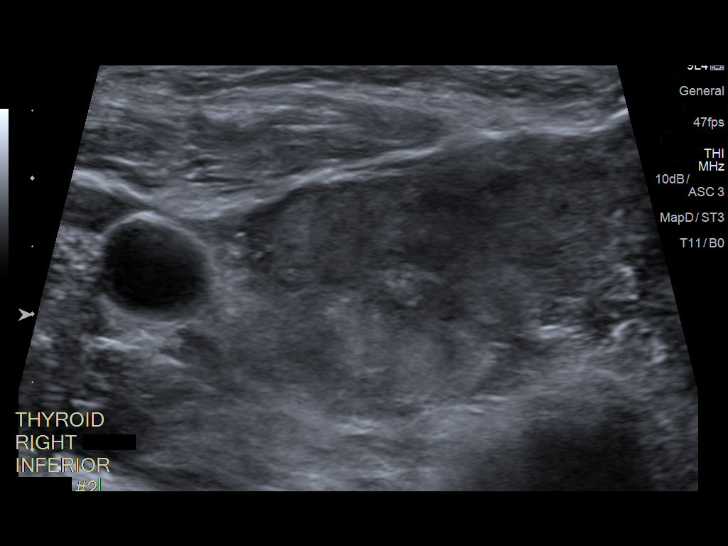
[im 12/20]
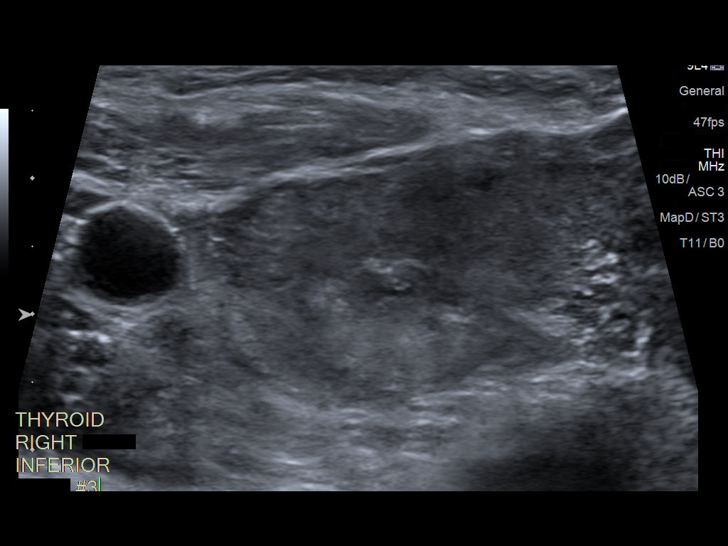
[im 14/20]
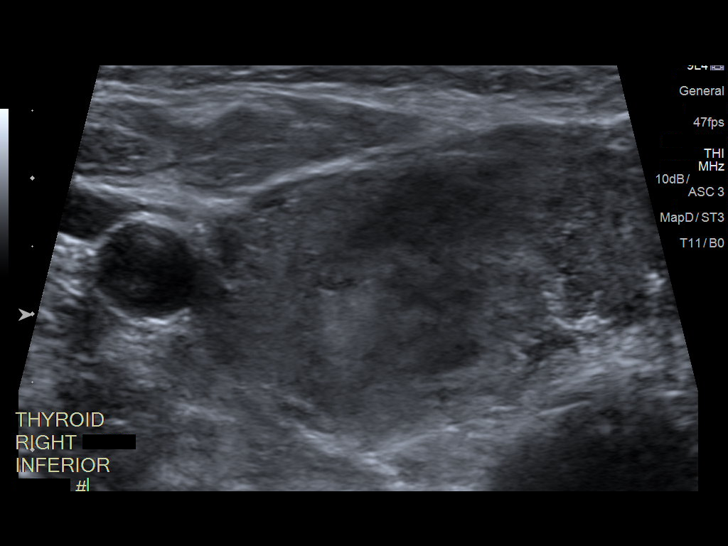
[im 15/20]
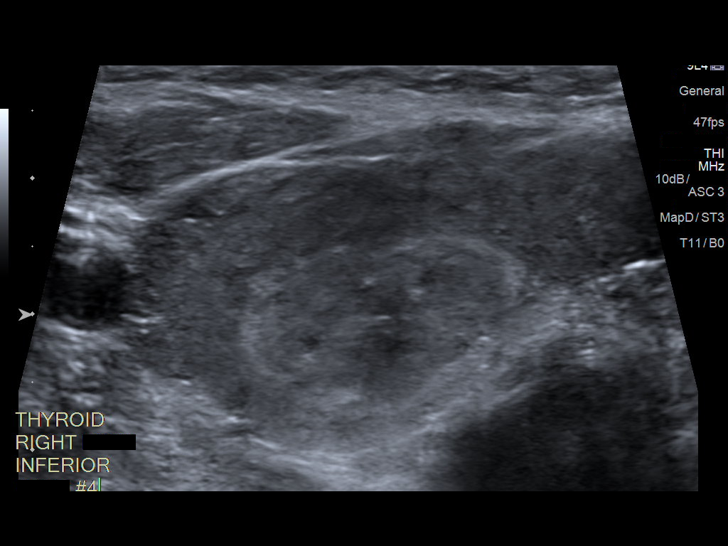
[im 17/20]
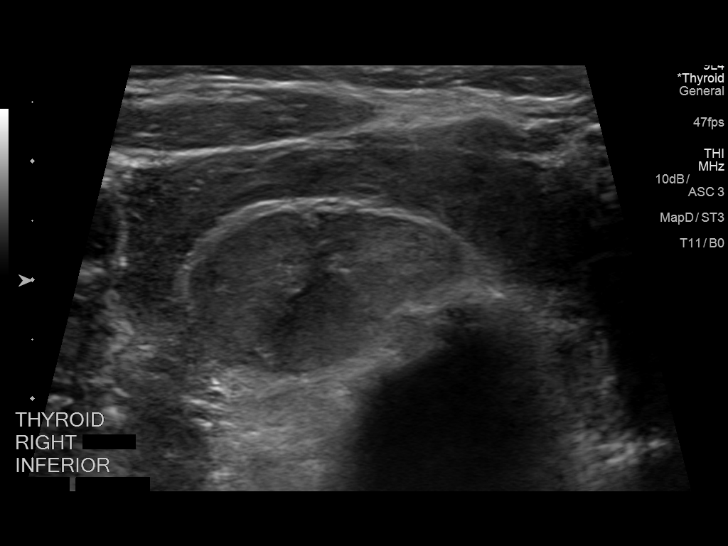
[im 18/20]
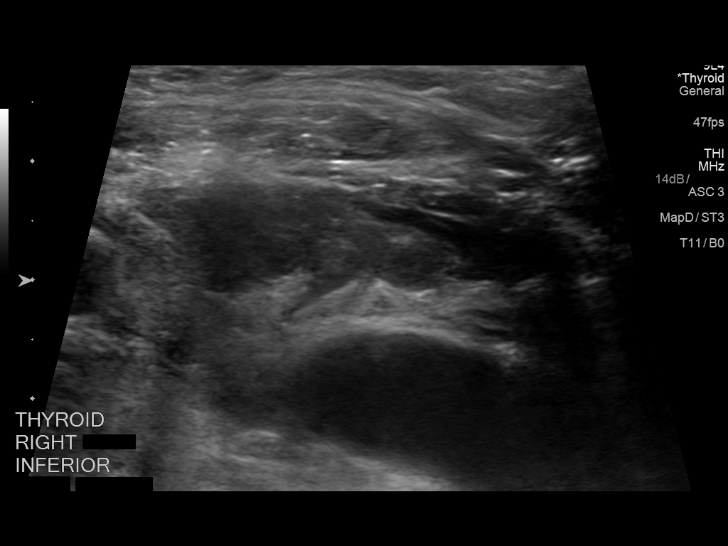
[im 20/20]
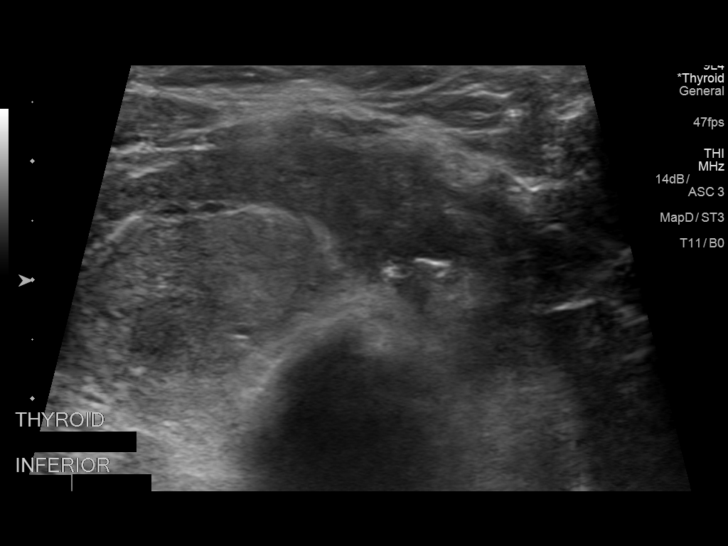

[13 of 20 positions shown; findings below may reference images not displayed]

MEDICATIONS:
5 mL 2% lidocaine

COMPLICATIONS:
SIR Level A - No therapy, no consequence. Post procedure hematoma.
Pre-procedural ultrasound scanning demonstrated unchanged size and
appearance of the indeterminate nodule within the right inferior
thyroid lobe

The procedure was planned. The neck was prepped in the usual sterile
fashion, and a sterile drape was applied covering the operative
field. A timeout was performed prior to the initiation of the
procedure. Local anesthesia was provided with 1% lidocaine.

Under direct ultrasound guidance, 4 FNA biopsies were performed of
the right inferior thyroid nodule with a 25 gauge needle. Multiple
ultrasound images were saved for procedural documentation purposes.
The samples were prepared and submitted to pathology.

Limited post procedural scanning was negative for hematoma or
additional complication. Dressings were placed. The patient
tolerated the above procedures procedure well without immediate
postprocedural complication.
FINDINGS: Nodule reference number based on prior diagnostic ultrasound: 1

Maximum size: 2.1 cm

Location: Right; Inferior

ACR TI-RADS risk category: TR4 (4-6 points)

Reason for biopsy: meets ACR TI-RADS criteria

Ultrasound imaging confirms appropriate placement of the needles
within the thyroid nodule.
IMPRESSION: Technically successful ultrasound guided fine needle aspiration of
right inferior thyroid nodule.

Read by Etoil, [place]

## 2020-04-15 ENCOUNTER — Other Ambulatory Visit: Payer: Self-pay | Admitting: Radiation Oncology

## 2020-04-15 ENCOUNTER — Ambulatory Visit
Admission: RE | Admit: 2020-04-15 | Discharge: 2020-04-15 | Disposition: A | Discharge status: Home / Self Care | Payer: Medicare Other | Source: Ambulatory Visit | Attending: Radiation Oncology | Admitting: Radiation Oncology

## 2020-04-15 DIAGNOSIS — C029 Malignant neoplasm of tongue, unspecified: Secondary | ICD-10-CM | POA: Diagnosis not present

## 2020-04-15 DIAGNOSIS — C021 Malignant neoplasm of border of tongue: Secondary | ICD-10-CM | POA: Diagnosis not present

## 2020-04-15 MED ORDER — HYDROCODONE-ACETAMINOPHEN 7.5-325 MG/15ML PO SOLN: 10.0000 mL | ORAL | 0 refills | Status: DC | PRN

## 2020-04-16 ENCOUNTER — Ambulatory Visit
Admission: RE | Admit: 2020-04-16 | Discharge: 2020-04-16 | Disposition: A | Discharge status: Home / Self Care | Payer: Medicare Other | Source: Ambulatory Visit | Attending: Radiation Oncology | Admitting: Radiation Oncology

## 2020-04-16 ENCOUNTER — Telehealth: Payer: Self-pay | Admitting: Dietician

## 2020-04-16 DIAGNOSIS — C029 Malignant neoplasm of tongue, unspecified: Secondary | ICD-10-CM | POA: Diagnosis not present

## 2020-04-16 DIAGNOSIS — C021 Malignant neoplasm of border of tongue: Secondary | ICD-10-CM | POA: Diagnosis not present

## 2020-04-16 NOTE — Telephone Encounter (Signed)
Telephone follow-up appointment completed with patient with new diagnosis of tongue cancer status post left partial glossectomy and left neck dissection. She is followed by Dr. Isidore Moos for radiation therapy, final treatment scheduled January 4.  Patient reports new sore throat this week and ongoing mouth soreness. She continues to use water/baking soda rinse as well as Lidocaine mouthwash.  Patient reports continuing to tolerate soft foods. She is drinking 1 Pure Protein drink blended with ice cream and 1 scoop of protein powder for extra calories and protein.  Patient reports she is maintaining her weight. Patient received nutrition fact sheets and coupons that were mailed to her last week.  Patient denies nausea, vomiting, diarrhea, constipation.  Medications: Calcium 600 + D, Pepcid, Hydrocodone, Lidocaine, MVI  Last labs on 03/19/20  Nutrition diagnosis: Food and nutrition related knowledge deficit related to tongue cancer and associated treatments as evidenced by no prior need for nutrition related information. -ongoing  Intervention: Patient agrees to increase oral supplements if unable to maintain regular diet due to mouth and throat soreness Encouraged pt to utilize menu ideas on fact sheet for soft and moist high protein menu ideas as well as tips for increasing calories and proteins.  Monitoring, evaluation, goals: Patient will consume adequate nutrition to maintain weight Patient will contact RD with questions or concerns.  RD will follow-up via telephone call with patient weekly. Next follow-up: 04/23/20

## 2020-04-17 ENCOUNTER — Encounter: Payer: Self-pay | Admitting: Radiation Oncology

## 2020-04-17 ENCOUNTER — Ambulatory Visit
Admission: RE | Admit: 2020-04-17 | Discharge: 2020-04-17 | Disposition: A | Discharge status: Home / Self Care | Payer: Medicare Other | Source: Ambulatory Visit | Attending: Radiation Oncology | Admitting: Radiation Oncology

## 2020-04-17 ENCOUNTER — Other Ambulatory Visit: Payer: Self-pay | Admitting: Radiation Oncology

## 2020-04-17 DIAGNOSIS — I10 Essential (primary) hypertension: Secondary | ICD-10-CM | POA: Diagnosis not present

## 2020-04-17 DIAGNOSIS — E785 Hyperlipidemia, unspecified: Secondary | ICD-10-CM | POA: Diagnosis not present

## 2020-04-17 DIAGNOSIS — Z20822 Contact with and (suspected) exposure to covid-19: Secondary | ICD-10-CM | POA: Diagnosis not present

## 2020-04-17 DIAGNOSIS — C021 Malignant neoplasm of border of tongue: Secondary | ICD-10-CM | POA: Diagnosis not present

## 2020-04-17 DIAGNOSIS — R112 Nausea with vomiting, unspecified: Secondary | ICD-10-CM | POA: Diagnosis not present

## 2020-04-17 DIAGNOSIS — C029 Malignant neoplasm of tongue, unspecified: Secondary | ICD-10-CM

## 2020-04-17 DIAGNOSIS — E871 Hypo-osmolality and hyponatremia: Secondary | ICD-10-CM | POA: Diagnosis not present

## 2020-04-17 MED ORDER — HYDROCODONE-ACETAMINOPHEN 7.5-325 MG/15ML PO SOLN: 10.0000 mL | ORAL | 0 refills | Status: DC | PRN

## 2020-04-17 MED FILL — HYDROCOD-APAP 7.5-325/15ML: 7.5-325 | 8 days supply | Qty: 650 | Fill #0

## 2020-04-18 ENCOUNTER — Encounter (HOSPITAL_COMMUNITY): Payer: Self-pay

## 2020-04-18 ENCOUNTER — Emergency Department (HOSPITAL_COMMUNITY)
Admission: EM | Admit: 2020-04-18 | Discharge: 2020-04-18 | Disposition: A | Discharge status: Home / Self Care | Payer: Medicare Other | Source: Home / Self Care | Attending: Emergency Medicine | Admitting: Emergency Medicine

## 2020-04-18 ENCOUNTER — Telehealth: Payer: Self-pay

## 2020-04-18 ENCOUNTER — Other Ambulatory Visit: Payer: Self-pay

## 2020-04-18 ENCOUNTER — Ambulatory Visit: Payer: Medicare Other

## 2020-04-18 DIAGNOSIS — R112 Nausea with vomiting, unspecified: Secondary | ICD-10-CM | POA: Diagnosis not present

## 2020-04-18 DIAGNOSIS — Z79899 Other long term (current) drug therapy: Secondary | ICD-10-CM | POA: Insufficient documentation

## 2020-04-18 DIAGNOSIS — C029 Malignant neoplasm of tongue, unspecified: Secondary | ICD-10-CM

## 2020-04-18 DIAGNOSIS — Z85810 Personal history of malignant neoplasm of tongue: Secondary | ICD-10-CM | POA: Insufficient documentation

## 2020-04-18 DIAGNOSIS — R5381 Other malaise: Secondary | ICD-10-CM

## 2020-04-18 DIAGNOSIS — Z96652 Presence of left artificial knee joint: Secondary | ICD-10-CM | POA: Insufficient documentation

## 2020-04-18 DIAGNOSIS — I10 Essential (primary) hypertension: Secondary | ICD-10-CM | POA: Insufficient documentation

## 2020-04-18 DIAGNOSIS — Z7982 Long term (current) use of aspirin: Secondary | ICD-10-CM | POA: Insufficient documentation

## 2020-04-18 LAB — CBC
HCT: 40.5 % (ref 36.0–46.0)
Hemoglobin: 13.8 g/dL (ref 12.0–15.0)
MCH: 31 pg (ref 26.0–34.0)
MCHC: 34.1 g/dL (ref 30.0–36.0)
MCV: 91 fL (ref 80.0–100.0)
Platelets: 308 10*3/uL (ref 150–400)
RBC: 4.45 MIL/uL (ref 3.87–5.11)
RDW: 11.9 % (ref 11.5–15.5)
WBC: 6.7 10*3/uL (ref 4.0–10.5)
nRBC: 0 % (ref 0.0–0.2)

## 2020-04-18 LAB — COMPREHENSIVE METABOLIC PANEL
ALT: 42 U/L (ref 0–44)
AST: 31 U/L (ref 15–41)
Albumin: 4.4 g/dL (ref 3.5–5.0)
Alkaline Phosphatase: 151 U/L — ABNORMAL HIGH (ref 38–126)
Anion gap: 12 (ref 5–15)
BUN: 14 mg/dL (ref 8–23)
CO2: 25 mmol/L (ref 22–32)
Calcium: 9.5 mg/dL (ref 8.9–10.3)
Chloride: 93 mmol/L — ABNORMAL LOW (ref 98–111)
Creatinine, Ser: 0.61 mg/dL (ref 0.44–1.00)
GFR, Estimated: >60 mL/min (ref >60)
Glucose, Bld: 143 mg/dL — ABNORMAL HIGH (ref 70–99)
Potassium: 4 mmol/L (ref 3.5–5.1)
Sodium: 130 mmol/L — ABNORMAL LOW (ref 135–145)
Total Bilirubin: 0.4 mg/dL (ref 0.3–1.2)
Total Protein: 8.2 g/dL — ABNORMAL HIGH (ref 6.5–8.1)

## 2020-04-18 LAB — URINALYSIS, ROUTINE W REFLEX MICROSCOPIC
Bilirubin Urine: NEGATIVE
Glucose, UA: NEGATIVE mg/dL
Hgb urine dipstick: NEGATIVE
Ketones, ur: NEGATIVE mg/dL
Nitrite: NEGATIVE
Protein, ur: NEGATIVE mg/dL
Specific Gravity, Urine: 1.009 (ref 1.005–1.030)
pH: 7 (ref 5.0–8.0)

## 2020-04-18 MED ORDER — ONDANSETRON 4 MG PO TBDP: 4.0000 mg | ORAL_TABLET | Freq: Three times a day (TID) | ORAL | 0 refills | Status: DC | PRN

## 2020-04-18 MED ORDER — MORPHINE SULFATE (PF) 2 MG/ML IV SOLN
2.0000 mg | Freq: Once | INTRAVENOUS | Status: AC
  Administered 2020-04-18: 2 mg via INTRAVENOUS
  Filled 2020-04-18: qty 1

## 2020-04-18 MED ORDER — SODIUM CHLORIDE 0.9 % IV BOLUS
1000.0000 mL | Freq: Once | INTRAVENOUS | Status: AC
  Administered 2020-04-18: 1000 mL via INTRAVENOUS

## 2020-04-18 MED ORDER — ONDANSETRON HCL 4 MG/2ML IJ SOLN
4.0000 mg | Freq: Once | INTRAMUSCULAR | Status: AC
  Administered 2020-04-18: 4 mg via INTRAVENOUS
  Filled 2020-04-18: qty 2

## 2020-04-18 NOTE — Discharge Instructions (Addendum)
Call your primary care doctor or specialist as discussed in the next 2-3 days.   Return immediately back to the ER if:  Your symptoms worsen within the next 12-24 hours. You develop new symptoms such as new fevers, persistent vomiting, new pain, shortness of breath, or new weakness or numbness, or if you have any other concerns.  

## 2020-04-18 NOTE — ED Triage Notes (Signed)
Pt arrives to ER with c/o vomiting since yesterday. Pt is receiving radiation treatment for cancer for 3 weeks so far. Part of tongue, lymph nodes and salivatory glands removed on 02/16/20.

## 2020-04-18 NOTE — ED Provider Notes (Signed)
[facility] DEPT Provider Note   CSN: [id number] Arrival date & time: 04/18/20  [id number]     History Chief Complaint  Patient presents with  . Vomiting    Erika [name] is a 74 y.o. female.  Patient with history of tongue cancer status post surgical intervention and ongoing radiation treatment.  Presents with episodes of vomiting since yesterday.  States she is unable to keep down any fluids.  Family states she has a history of hyponatremia at times and is concerned that her sodium may be too low.  Patient denies any diarrhea.  Denies fevers or cough.  Denies any pain outside of her baseline pain from her cancer.        Past Medical History:  Diagnosis Date  . A-fib ([place])   . Adrenal nodule ([place])   . Anxiety   . Arthritis   . Atrophic vaginitis   . Chronic hyponatremia   . Dysrhythmia   . Female climacteric state   . GERD (gastroesophageal reflux disease)   . Headache   . Hypercholesteremia   . Hyperlipidemia   . Hypertension   . Leukopenia   . PONV (postoperative nausea and vomiting)   . Recurrent UTI   . Right thyroid nodule   . Thrombocytopenia ([place])   . Tongue ulcer     Patient Active Problem List   Diagnosis Date Noted  . Tongue cancer ([place]) 02/16/2020  . Hyperlipidemia 12/14/2017  . Abnormal EKG 12/14/2017  . Thyroid nodule 12/14/2017  . A-fib ([place])   . AF (paroxysmal atrial fibrillation) ([place]) 10/15/2017  . Leukopenia 10/12/2017  . Thrombocytopenia ([place]) 10/12/2017  . Elevated transaminase level 10/12/2017  . Acute hyponatremia 10/12/2017  . Sepsis secondary to UTI ([place]) 10/12/2017  . Hypertension 10/12/2017  . Positive D dimer 10/12/2017  . Recurrent UTI 10/12/2017  . GERD (gastroesophageal reflux disease) 10/12/2017  . Arthritis 10/12/2017  . Tick bites 10/12/2017  . Status post total right knee replacement 03/31/2015  . Chronic cough 11/03/2012    Past Surgical History:  Procedure Laterality Date  .  ESOPHAGOGASTRODUODENOSCOPY    . EXCISION OF TONGUE LESION N/A 02/16/2020   Procedure: EXCISION OF TONGUE LESION Partial Glossectomy;  Surgeon: [name], MD;  Location: [facility];  Service: ENT;  Laterality: N/A;  . HERNIA REPAIR     x2  groin  . JOINT REPLACEMENT    . PARTIAL GLOSSECTOMY  02/16/2020   EXCISION OF TONGUE LESION Partial Glossectomy (N/A Mouth)  . RADICAL NECK DISSECTION Left 02/16/2020   Procedure: RADICAL NECK DISSECTION Partial Left Neck Dissection;  Surgeon: [name], MD;  Location: [facility];  Service: ENT;  Laterality: Left;  . REPLACEMENT TOTAL KNEE     right knee  . SKIN CANCER EXCISION  05/2017   Excised from calf area  . TONSILLECTOMY    . TUBAL LIGATION       OB History   No obstetric history on file.     Family History  Problem Relation Age of Onset  . Emphysema Sister   . Emphysema Brother   . Hypertension Mother   . Heart failure Mother   . Hypertension Father   . CVA Father     Social History   Tobacco Use  . Smoking status: Never Smoker  . Smokeless tobacco: Never Used  Vaping Use  . Vaping Use: Never used  Substance Use Topics  . Alcohol use: No  . Drug use: No    Home Medications Prior to  Admission medications   Medication Sig Start Date End Date Taking? Authorizing Provider  acetaminophen (TYLENOL) 500 MG tablet Take 1,000 mg by mouth every 6 (six) hours as needed for moderate pain or headache.    [provider]  amLODipine (NORVASC) 5 MG tablet Take 1 tablet (5 mg total) by mouth daily. Please make overdue appt with Dr. Katrinka Blazing before anymore refills. 3rd and Final Attempt Patient taking differently: Take 5 mg by mouth at bedtime. Please make overdue appt with Dr. Katrinka Blazing before anymore refills. 3rd and Final Attempt 07/17/19   Lyn Records, MD  aspirin EC 81 MG tablet Take 81 mg by mouth daily.    [provider]  Calcium Carb-Cholecalciferol (CALCIUM 600 + D PO) Take 1 tablet by mouth daily.    [provider]  Carboxymethylcellulose Sodium (ARTIFICIAL TEARS OP) Place 1 drop into both eyes daily as needed (dry eyes).    [provider]  cetirizine (ZYRTEC) 10 MG tablet Take 10 mg by mouth daily.    [provider]  diclofenac Sodium (VOLTAREN) 1 % GEL Apply 1 application topically 4 (four) times daily as needed (pain).    [provider]  esomeprazole (NEXIUM) 40 MG capsule Take 40 mg by mouth daily. 01/20/18   [provider]  famotidine (PEPCID) 20 MG tablet Take 20 mg by mouth at bedtime.     [provider]  fluticasone (FLONASE) 50 MCG/ACT nasal spray INSTILL 2 SPRAYS IN EACH NOSTRIL EVERY DAY. Patient taking differently: Place 2 sprays into both nostrils daily. 09/05/13   Clance, Maree Krabbe, MD  HYDROcodone-acetaminophen (HYCET) 7.5-325 mg/15 ml solution Take 10-15 mLs by mouth every 4 (four) hours as needed for moderate pain. 04/17/20   Lonie Peak, MD  lidocaine (XYLOCAINE) 2 % solution Patient: Mix 1part 2% viscous lidocaine, 1part H20. Swish & swallow 26mL of diluted mixture, before meals and at bedtime, up to 5 times daily. 04/08/20   Lonie Peak, MD  losartan (COZAAR) 100 MG tablet Take 1 tablet (100 mg total) by mouth daily. Please make overdue appt with Dr. Katrinka Blazing before anymore refills. 2nd attempt 08/01/19   Lyn Records, MD  metoprolol tartrate (LOPRESSOR) 25 MG tablet Take 25 mg by mouth 2 (two) times daily.     [provider]  Multiple Vitamin (MULTIVITAMIN WITH MINERALS) TABS tablet Take 1 tablet by mouth daily.    [provider]  nitrofurantoin (MACRODANTIN) 50 MG capsule Take 50 mg by mouth at bedtime.     [provider]  ondansetron (ZOFRAN ODT) 4 MG disintegrating tablet Take 1 tablet (4 mg total) by mouth every 8 (eight) hours as needed for nausea or vomiting. 04/18/20   Cheryll Cockayne, MD  Probiotic Product (RA PROBIOTIC GUMMIES) CHEW Chew 2 capsules by mouth daily.    [provider]  simvastatin (ZOCOR) 20 MG tablet Take 20 mg by mouth at bedtime.     [provider]  trolamine salicylate (ASPERCREME) 10 % cream Apply 1 application topically as needed for muscle pain.    [provider]    Allergies    Hydrochlorothiazide, Atorvastatin, Codeine, and Strawberry flavor  Review of Systems   Review of Systems  Constitutional: Negative for fever.  HENT: Negative for ear pain.   Eyes: Negative for pain.  Respiratory: Negative for cough.   Cardiovascular: Negative for chest pain.  Gastrointestinal: Positive for vomiting. Negative for abdominal pain.  Genitourinary: Negative for flank pain.  Musculoskeletal:  Negative for back pain.  Skin: Negative for rash.  Neurological: Negative for headaches.    Physical Exam Updated Vital Signs BP 122/67   Pulse 83   Temp 98.5 F (36.9 C) (Oral)   Resp 16   Ht 5\' 3"  (1.6 m)   Wt 63.5 kg   SpO2 92%   BMI 24.80 kg/m   Physical Exam Constitutional:      General: She is not in acute distress.    Appearance: Normal appearance.  HENT:     Head: Normocephalic.     Nose: Nose normal.  Eyes:     Extraocular Movements: Extraocular movements intact.  Cardiovascular:     Rate and Rhythm: Normal rate.  Pulmonary:     Effort: Pulmonary effort is normal.  Abdominal:     Palpations: Abdomen is soft.     Tenderness: There is no abdominal tenderness.  Musculoskeletal:        General: Normal range of motion.     Cervical back: Normal range of motion.  Skin:    General: Skin is warm.  Neurological:     General: No focal deficit present.     Mental Status: She is alert and oriented to person, place, and time. Mental status is at baseline.     Cranial Nerves: No cranial nerve deficit.     Motor: No weakness.     ED Results / Procedures / Treatments   Labs (all labs ordered are listed, but only abnormal results are displayed) Labs Reviewed  COMPREHENSIVE METABOLIC PANEL - Abnormal; Notable for the  following components:      Result Value   Sodium 130 (*)    Chloride 93 (*)    Glucose, Bld 143 (*)    Total Protein 8.2 (*)    Alkaline Phosphatase 151 (*)    All other components within normal limits  URINALYSIS, ROUTINE W REFLEX MICROSCOPIC - Abnormal; Notable for the following components:   Leukocytes,Ua SMALL (*)    Bacteria, UA RARE (*)    All other components within normal limits  CBC    EKG None  Radiology No results found.  Procedures Procedures (including critical care time)  Medications Ordered in ED Medications  sodium chloride 0.9 % bolus 1,000 mL (0 mLs Intravenous Stopped 04/18/20 0920)  ondansetron (ZOFRAN) injection 4 mg (4 mg Intravenous Given 04/18/20 0743)  morphine 2 MG/ML injection 2 mg (2 mg Intravenous Given 04/18/20 [id number])    ED Course  I have reviewed the triage vital signs and the nursing notes.  Pertinent labs & imaging results that were available during my care of the patient were reviewed by me and considered in my medical decision making (see chart for details).    MDM Rules/Calculators/A&P                          Labs show white count of 6 hemoglobin of 13.8.  Chemistry shows sodium of 130.  Patient given a liter bolus of fluids and Zofran.  Now able to tolerate p.o. hydration.  Due to her history of hyponatremia she does have increased salt intake at home.  Advised her to continue increased salt intake at home.  Advised follow-up with her doctors in 3 to 4 days for repeat evaluation.  Advised immediate return if she is unable to keep down any fluids or if she has any additional concerns.   Final Clinical Impression(s) / ED Diagnoses Final diagnoses:  Non-intractable vomiting with  nausea, unspecified vomiting type    Rx / DC Orders ED Discharge Orders         Ordered    ondansetron (ZOFRAN ODT) 4 MG disintegrating tablet  Every 8 hours PRN        04/18/20 1016           Luna Fuse, MD 04/18/20 1016

## 2020-04-18 NOTE — Telephone Encounter (Signed)
Received phone call from patient's son that patient had been discharged from ED, but was still struggling with nausea and keeping any food/drink down. Patient relayed to son that she would like to stop radiation, and wanted to know what her next steps were. Informed son that I would need to discuss matter with Dr. Isidore Moos first, but would call him back with her recommendations. Son verbalized agreement.   Relayed information to Dr. Isidore Moos who advised that rather than officially canceling treatment, patient should take the next week off to try and recuperate. She should then come in early before her appointment on 04/29/20 for labs and to speak with Dr. Isidore Moos directly. Dr. Isidore Moos wants to assess patient's status after a week break, and provide patient with pros and cons of stopping treatment early so patient can make a fully informed decision. If patient does not improve over the weekend, she or her son will call me directly and I will try and get patient into our [facility] for evaluation and supportive therapy.   Called returned son's call and relayed Dr. Pearlie Oyster advice. Informed son that patient will have labs drawn at 10:15 on 04/29/20 and then meet with Dr. Isidore Moos afterwards to discuss results and talk about future treatments. Provided direct call back number for son or patient to call Monday if patient does not improve over the weekend, as well as triage number for them to utilize over the weekend. However, advised that if patient's condition continues to deteriorate, patient should return to ED for evaluation. Son verbalized understanding and agreement of plan, and denied any other needs at this time.

## 2020-04-18 NOTE — ED Notes (Signed)
Pt given ice water for PO challenge. Pt asking for ice chips instead. Pt given ice chips

## 2020-04-19 ENCOUNTER — Inpatient Hospital Stay (HOSPITAL_COMMUNITY)
Admission: EM | Admit: 2020-04-19 | Discharge: 2020-04-22 | DRG: 392 | Disposition: A | Discharge status: Home / Self Care | Payer: Medicare Other | Attending: Internal Medicine | Admitting: Internal Medicine

## 2020-04-19 ENCOUNTER — Encounter (HOSPITAL_COMMUNITY): Payer: Self-pay | Admitting: Emergency Medicine

## 2020-04-19 DIAGNOSIS — E871 Hypo-osmolality and hyponatremia: Secondary | ICD-10-CM

## 2020-04-19 DIAGNOSIS — E876 Hypokalemia: Secondary | ICD-10-CM | POA: Diagnosis present

## 2020-04-19 DIAGNOSIS — E785 Hyperlipidemia, unspecified: Secondary | ICD-10-CM | POA: Diagnosis present

## 2020-04-19 DIAGNOSIS — E7800 Pure hypercholesterolemia, unspecified: Secondary | ICD-10-CM | POA: Diagnosis present

## 2020-04-19 DIAGNOSIS — Z87440 Personal history of urinary (tract) infections: Secondary | ICD-10-CM

## 2020-04-19 DIAGNOSIS — Z96651 Presence of right artificial knee joint: Secondary | ICD-10-CM | POA: Diagnosis present

## 2020-04-19 DIAGNOSIS — Z20822 Contact with and (suspected) exposure to covid-19: Secondary | ICD-10-CM | POA: Diagnosis present

## 2020-04-19 DIAGNOSIS — I10 Essential (primary) hypertension: Secondary | ICD-10-CM | POA: Diagnosis present

## 2020-04-19 DIAGNOSIS — C029 Malignant neoplasm of tongue, unspecified: Secondary | ICD-10-CM | POA: Diagnosis present

## 2020-04-19 DIAGNOSIS — F419 Anxiety disorder, unspecified: Secondary | ICD-10-CM | POA: Diagnosis present

## 2020-04-19 DIAGNOSIS — Z885 Allergy status to narcotic agent status: Secondary | ICD-10-CM

## 2020-04-19 DIAGNOSIS — Z7982 Long term (current) use of aspirin: Secondary | ICD-10-CM

## 2020-04-19 DIAGNOSIS — Z823 Family history of stroke: Secondary | ICD-10-CM

## 2020-04-19 DIAGNOSIS — Z825 Family history of asthma and other chronic lower respiratory diseases: Secondary | ICD-10-CM

## 2020-04-19 DIAGNOSIS — K219 Gastro-esophageal reflux disease without esophagitis: Secondary | ICD-10-CM | POA: Diagnosis present

## 2020-04-19 DIAGNOSIS — Z8249 Family history of ischemic heart disease and other diseases of the circulatory system: Secondary | ICD-10-CM

## 2020-04-19 DIAGNOSIS — Z888 Allergy status to other drugs, medicaments and biological substances status: Secondary | ICD-10-CM

## 2020-04-19 DIAGNOSIS — R112 Nausea with vomiting, unspecified: Secondary | ICD-10-CM | POA: Diagnosis not present

## 2020-04-19 DIAGNOSIS — Z79899 Other long term (current) drug therapy: Secondary | ICD-10-CM

## 2020-04-19 LAB — COMPREHENSIVE METABOLIC PANEL
ALT: 31 U/L (ref 0–44)
AST: 30 U/L (ref 15–41)
Albumin: 3.9 g/dL (ref 3.5–5.0)
Alkaline Phosphatase: 126 U/L (ref 38–126)
Anion gap: 11 (ref 5–15)
BUN: 10 mg/dL (ref 8–23)
CO2: 24 mmol/L (ref 22–32)
Calcium: 9.1 mg/dL (ref 8.9–10.3)
Chloride: 95 mmol/L — ABNORMAL LOW (ref 98–111)
Creatinine, Ser: 0.58 mg/dL (ref 0.44–1.00)
GFR, Estimated: >60 mL/min (ref >60)
Glucose, Bld: 133 mg/dL — ABNORMAL HIGH (ref 70–99)
Potassium: 4.3 mmol/L (ref 3.5–5.1)
Sodium: 130 mmol/L — ABNORMAL LOW (ref 135–145)
Total Bilirubin: 0.9 mg/dL (ref 0.3–1.2)
Total Protein: 7.8 g/dL (ref 6.5–8.1)

## 2020-04-19 LAB — CBC
HCT: 37.5 % (ref 36.0–46.0)
Hemoglobin: 13 g/dL (ref 12.0–15.0)
MCH: 31.2 pg (ref 26.0–34.0)
MCHC: 34.7 g/dL (ref 30.0–36.0)
MCV: 89.9 fL (ref 80.0–100.0)
Platelets: 291 10*3/uL (ref 150–400)
RBC: 4.17 MIL/uL (ref 3.87–5.11)
RDW: 12 % (ref 11.5–15.5)
WBC: 5.1 10*3/uL (ref 4.0–10.5)
nRBC: 0 % (ref 0.0–0.2)

## 2020-04-19 LAB — URINALYSIS, ROUTINE W REFLEX MICROSCOPIC
Bilirubin Urine: NEGATIVE
Glucose, UA: NEGATIVE mg/dL
Hgb urine dipstick: NEGATIVE
Ketones, ur: 20 mg/dL — AB
Nitrite: NEGATIVE
Protein, ur: NEGATIVE mg/dL
Specific Gravity, Urine: 1.012 (ref 1.005–1.030)
pH: 7 (ref 5.0–8.0)

## 2020-04-19 LAB — RESP PANEL BY RT-PCR (FLU A&B, COVID) ARPGX2
Influenza A by PCR: NEGATIVE
Influenza B by PCR: NEGATIVE
SARS Coronavirus 2 by RT PCR: NEGATIVE

## 2020-04-19 LAB — LIPASE, BLOOD: Lipase: 22 U/L (ref 11–51)

## 2020-04-19 MED ORDER — METOCLOPRAMIDE HCL 5 MG/ML IJ SOLN
10.0000 mg | Freq: Once | INTRAMUSCULAR | Status: AC
  Administered 2020-04-19: 10 mg via INTRAVENOUS
  Filled 2020-04-19: qty 2

## 2020-04-19 MED ORDER — ASPIRIN EC 81 MG PO TBEC: 81.0000 mg | DELAYED_RELEASE_TABLET | Freq: Every day | ORAL | Status: DC

## 2020-04-19 MED ORDER — FLUTICASONE PROPIONATE 50 MCG/ACT NA SUSP
2.0000 | Freq: Every day | NASAL | Status: DC
  Administered 2020-04-20 – 2020-04-22 (×3): 2 via NASAL
  Filled 2020-04-19: qty 16

## 2020-04-19 MED ORDER — ACETAMINOPHEN 325 MG PO TABS
650.0000 mg | ORAL_TABLET | Freq: Four times a day (QID) | ORAL | Status: DC | PRN
  Administered 2020-04-19: 650 mg via ORAL
  Filled 2020-04-19: qty 2

## 2020-04-19 MED ORDER — AMLODIPINE BESYLATE 5 MG PO TABS
5.0000 mg | ORAL_TABLET | Freq: Every day | ORAL | Status: DC
  Filled 2020-04-19: qty 1

## 2020-04-19 MED ORDER — ENOXAPARIN SODIUM 40 MG/0.4ML ~~LOC~~ SOLN
40.0000 mg | SUBCUTANEOUS | Status: DC
  Administered 2020-04-19 – 2020-04-21 (×3): 40 mg via SUBCUTANEOUS
  Filled 2020-04-19 (×3): qty 0.4

## 2020-04-19 MED ORDER — TRAMADOL 5 MG/ML ORAL SUSPENSION: 25.0000 mg | Freq: Once | ORAL | Status: DC

## 2020-04-19 MED ORDER — PANTOPRAZOLE SODIUM 40 MG IV SOLR
40.0000 mg | Freq: Once | INTRAVENOUS | Status: AC
  Administered 2020-04-19: 40 mg via INTRAVENOUS
  Filled 2020-04-19: qty 40

## 2020-04-19 MED ORDER — SODIUM CHLORIDE 0.9 % IV SOLN: INTRAVENOUS | Status: DC

## 2020-04-19 MED ORDER — TRAMADOL HCL 50 MG PO TABS: 25.0000 mg | ORAL_TABLET | Freq: Once | ORAL | Status: DC

## 2020-04-19 MED ORDER — SIMVASTATIN 20 MG PO TABS
20.0000 mg | ORAL_TABLET | Freq: Every day | ORAL | Status: DC
  Filled 2020-04-19: qty 1

## 2020-04-19 MED ORDER — LIDOCAINE VISCOUS HCL 2 % MT SOLN
15.0000 mL | Freq: Four times a day (QID) | OROMUCOSAL | Status: DC | PRN
  Administered 2020-04-19 – 2020-04-20 (×2): 15 mL via OROMUCOSAL
  Filled 2020-04-19 (×3): qty 15

## 2020-04-19 MED ORDER — SODIUM CHLORIDE 0.9 % IV SOLN: Freq: Once | INTRAVENOUS | Status: AC

## 2020-04-19 MED ORDER — LOSARTAN POTASSIUM 50 MG PO TABS: 100.0000 mg | ORAL_TABLET | Freq: Every day | ORAL | Status: DC

## 2020-04-19 MED ORDER — MELATONIN 5 MG PO TABS: 5.0000 mg | ORAL_TABLET | Freq: Every day | ORAL | Status: DC

## 2020-04-19 MED ORDER — METOCLOPRAMIDE HCL 5 MG/ML IJ SOLN
10.0000 mg | Freq: Four times a day (QID) | INTRAMUSCULAR | Status: DC
  Administered 2020-04-19 – 2020-04-22 (×12): 10 mg via INTRAVENOUS
  Filled 2020-04-19 (×12): qty 2

## 2020-04-19 MED ORDER — ONDANSETRON HCL 4 MG/2ML IJ SOLN
4.0000 mg | Freq: Four times a day (QID) | INTRAMUSCULAR | Status: DC | PRN
  Administered 2020-04-19: 4 mg via INTRAVENOUS
  Filled 2020-04-19: qty 2

## 2020-04-19 MED ORDER — METOPROLOL TARTRATE 25 MG PO TABS
25.0000 mg | ORAL_TABLET | Freq: Two times a day (BID) | ORAL | Status: DC
  Filled 2020-04-19: qty 1

## 2020-04-19 MED ORDER — KETOROLAC TROMETHAMINE 30 MG/ML IJ SOLN
14.0000 mg | Freq: Once | INTRAMUSCULAR | Status: AC
  Administered 2020-04-19: 14 mg via INTRAVENOUS
  Filled 2020-04-19: qty 1

## 2020-04-19 MED ORDER — LACTATED RINGERS IV BOLUS
1000.0000 mL | Freq: Once | INTRAVENOUS | Status: AC
  Administered 2020-04-19: 1000 mL via INTRAVENOUS

## 2020-04-19 MED ORDER — PANTOPRAZOLE SODIUM 40 MG IV SOLR
40.0000 mg | INTRAVENOUS | Status: DC
  Administered 2020-04-20: 40 mg via INTRAVENOUS
  Filled 2020-04-19: qty 40

## 2020-04-19 NOTE — ED Notes (Signed)
Report called Probation officer.

## 2020-04-19 NOTE — H&P (Signed)
History and Physical    Erika [name] [id number] DOB: 06-24-1945 DOA: 04/19/2020  PCP: [name], MD  Patient coming from: Home  Chief Complaint: Vomiting  HPI: Erika [name] is a 74 y.o. 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 Presenting with intractable N/V. She reports that her N/V began 2 days ago. She did not have any abdominal pain, fevers, or change in stools. She tried an unspecified nausea pill, but it did not help. She came to the ED yesterday. She was given zofran and completed a successful PO challenge. She was discharged home on zofran afterwards. She states not long after she got home, she started having N/V again. She could not tolerate fluids. She felt it prudent to return to the ED.   ED Course: Labwork was only remarkable for chronic hypoNa+. She was given reglan and it seems to have improved her situation. TRH was called for admission.   Review of Systems:  Denies CP, dyspnea, palpitations, fevers, diarrhea, syncopal episodes. Reports fatigue. Review of systems is otherwise negative for all not mentioned in HPI.   PMHx Past Medical History:  Diagnosis Date  . A-fib ([place])   . Adrenal nodule ([place])   . Anxiety   . Arthritis   . Atrophic vaginitis   . Chronic hyponatremia   . Dysrhythmia   . Female climacteric state   . GERD (gastroesophageal reflux disease)   . Headache   . Hypercholesteremia   . Hyperlipidemia   . Hypertension   . Leukopenia   . PONV (postoperative nausea and vomiting)   . Recurrent UTI   . Right thyroid nodule   . Thrombocytopenia ([place])   . Tongue ulcer     PSHx Past Surgical History:  Procedure Laterality Date  . ESOPHAGOGASTRODUODENOSCOPY    . EXCISION OF TONGUE LESION N/A 02/16/2020   Procedure: EXCISION OF TONGUE LESION Partial Glossectomy;  Surgeon: [name], MD;  Location: [facility];  Service: ENT;  Laterality: N/A;  . HERNIA REPAIR     x2  groin  . JOINT REPLACEMENT    .  PARTIAL GLOSSECTOMY  02/16/2020   EXCISION OF TONGUE LESION Partial Glossectomy (N/A Mouth)  . RADICAL NECK DISSECTION Left 02/16/2020   Procedure: RADICAL NECK DISSECTION Partial Left Neck Dissection;  Surgeon: [name], MD;  Location: [facility];  Service: ENT;  Laterality: Left;  . REPLACEMENT TOTAL KNEE     right knee  . SKIN CANCER EXCISION  05/2017   Excised from calf area  . TONSILLECTOMY    . TUBAL LIGATION      SocHx  reports that she has never smoked. She has never used smokeless tobacco. She reports that she does not drink alcohol and does not use drugs.  Allergies  Allergen Reactions  . Hydrochlorothiazide Other (See Comments)    Brain swelling  . Atorvastatin Other (See Comments)    cramps  . Codeine Nausea And Vomiting  . Strawberry Flavor Itching    FamHx Family History  Problem Relation Age of Onset  . Emphysema Sister   . Emphysema Brother   . Hypertension Mother   . Heart failure Mother   . Hypertension Father   . CVA Father     Prior to Admission medications   Medication Sig Start Date End Date Taking? Authorizing Provider  acetaminophen (TYLENOL) 500 MG tablet Take 1,000 mg by mouth every 6 (six) hours as needed for moderate pain or headache.    [provider]  amLODipine (NORVASC) 5 MG tablet Take 1 tablet (5 mg total) by mouth daily. Please make overdue appt with Dr. Tamala Erika before anymore refills. 3rd and Final Attempt Patient taking differently: Take 5 mg by mouth at bedtime. Please make overdue appt with Dr. Tamala Erika before anymore refills. 3rd and Final Attempt 07/17/19   Belva Crome, MD  aspirin EC 81 MG tablet Take 81 mg by mouth daily.    [provider]  Calcium Carb-Cholecalciferol (CALCIUM 600 + D PO) Take 1 tablet by mouth daily.    [provider]  Carboxymethylcellulose Sodium (ARTIFICIAL TEARS OP) Place 1 drop into both eyes daily as needed (dry eyes).    [provider]  cetirizine (ZYRTEC) 10 MG tablet  Take 10 mg by mouth daily.    [provider]  diclofenac Sodium (VOLTAREN) 1 % GEL Apply 1 application topically 4 (four) times daily as needed (pain).    [provider]  esomeprazole (NEXIUM) 40 MG capsule Take 40 mg by mouth daily. 01/20/18   [provider]  famotidine (PEPCID) 20 MG tablet Take 20 mg by mouth at bedtime.     [provider]  fluticasone (FLONASE) 50 MCG/ACT nasal spray INSTILL 2 SPRAYS IN EACH NOSTRIL EVERY DAY. Patient taking differently: Place 2 sprays into both nostrils daily. 09/05/13   Clance, Armando Reichert, MD  HYDROcodone-acetaminophen (HYCET) 7.5-325 mg/15 ml solution Take 10-15 mLs by mouth every 4 (four) hours as needed for moderate pain. 04/17/20   Eppie Gibson, MD  lidocaine (XYLOCAINE) 2 % solution Patient: Mix 1part 2% viscous lidocaine, 1part H20. Swish & swallow 63mL of diluted mixture, 6min before meals and at bedtime, up to 5 times daily. 04/08/20   Eppie Gibson, MD  losartan (COZAAR) 100 MG tablet Take 1 tablet (100 mg total) by mouth daily. Please make overdue appt with Dr. Tamala Erika before anymore refills. 2nd attempt 08/01/19   Belva Crome, MD  metoprolol tartrate (LOPRESSOR) 25 MG tablet Take 25 mg by mouth 2 (two) times daily.     [provider]  Multiple Vitamin (MULTIVITAMIN WITH MINERALS) TABS tablet Take 1 tablet by mouth daily.    [provider]  nitrofurantoin (MACRODANTIN) 50 MG capsule Take 50 mg by mouth at bedtime.     [provider]  ondansetron (ZOFRAN ODT) 4 MG disintegrating tablet Take 1 tablet (4 mg total) by mouth every 8 (eight) hours as needed for nausea or vomiting. 04/18/20   Luna Fuse, MD  Probiotic Product (RA PROBIOTIC GUMMIES) CHEW Chew 2 capsules by mouth daily.    [provider]  simvastatin (ZOCOR) 20 MG tablet Take 20 mg by mouth at bedtime.     [provider]  trolamine salicylate (ASPERCREME) 10 % cream Apply 1 application topically as needed  for muscle pain.    [provider]    Physical Exam: Vitals:   04/19/20 0500 04/19/20 0530 04/19/20 0600 04/19/20 0700  BP: (!) 154/69 (!) 149/118 (!) 146/73 129/80  Pulse: 80 90 78 79  Resp: 14 16 14 15   Temp:      TempSrc:      SpO2: 94% 93% 95% 95%  Weight:      Height:        General: 74 y.o. female resting in bed in NAD Eyes: PERRL, normal sclera ENMT: Nares patent w/o discharge, orophaynx clear, dentition normal, ears w/o discharge/lesions/ulcers Neck: Supple Cardiovascular: RRR, +S1, S2, no m/g/r, equal pulses throughout Respiratory: CTABL,  no w/r/r, normal WOB GI: BS+, NDNT, no masses noted, no organomegaly noted MSK: No e/c/c Skin: No rashes, bruises, ulcerations noted Neuro: A&O x 3, no focal deficits Psyc: Appropriate interaction and affect, calm/cooperative  Labs on Admission: I have personally reviewed following labs and imaging studies  CBC: Recent Labs  Lab 04/18/20 0650 04/19/20 0146  WBC 6.7 5.1  HGB 13.8 13.0  HCT 40.5 37.5  MCV 91.0 89.9  PLT 308 [id number]   Basic Metabolic Panel: Recent Labs  Lab 04/18/20 0650 04/19/20 0146  NA 130* 130*  K 4.0 4.3  CL 93* 95*  CO2 25 24  GLUCOSE 143* 133*  BUN 14 10  CREATININE 0.61 0.58  CALCIUM 9.5 9.1   GFR: Estimated Creatinine Clearance: 53.6 mL/min (by C-G formula based on SCr of 0.58 mg/dL). Liver Function Tests: Recent Labs  Lab 04/18/20 0650 04/19/20 0146  AST 31 30  ALT 42 31  ALKPHOS 151* 126  BILITOT 0.4 0.9  PROT 8.2* 7.8  ALBUMIN 4.4 3.9   Recent Labs  Lab 04/19/20 0146  LIPASE 22   No results for input(s): AMMONIA in the last 168 hours. Coagulation Profile: No results for input(s): INR, PROTIME in the last 168 hours. Cardiac Enzymes: No results for input(s): CKTOTAL, CKMB, CKMBINDEX, TROPONINI in the last 168 hours. BNP (last 3 results) No results for input(s): PROBNP in the last 8760 hours. HbA1C: No results for input(s): HGBA1C in the last 72 hours. CBG: No  results for input(s): GLUCAP in the last 168 hours. Lipid Profile: No results for input(s): CHOL, HDL, LDLCALC, TRIG, CHOLHDL, LDLDIRECT in the last 72 hours. Thyroid Function Tests: No results for input(s): TSH, T4TOTAL, FREET4, T3FREE, THYROIDAB in the last 72 hours. Anemia Panel: No results for input(s): VITAMINB12, FOLATE, FERRITIN, TIBC, IRON, RETICCTPCT in the last 72 hours. Urine analysis:    Component Value Date/Time   COLORURINE YELLOW 04/19/2020 0146   APPEARANCEUR HAZY (A) 04/19/2020 0146   LABSPEC 1.012 04/19/2020 0146   PHURINE 7.0 04/19/2020 0146   GLUCOSEU NEGATIVE 04/19/2020 0146   HGBUR NEGATIVE 04/19/2020 0146   BILIRUBINUR NEGATIVE 04/19/2020 0146   KETONESUR 20 (A) 04/19/2020 0146   PROTEINUR NEGATIVE 04/19/2020 0146   UROBILINOGEN 0.2 09/08/2008 1310   NITRITE NEGATIVE 04/19/2020 0146   LEUKOCYTESUR LARGE (A) 04/19/2020 0146    Radiological Exams on Admission: No results found.  EKG: Independently reviewed. NSR, no st changes  Assessment/Plan Intractable N/V     - place in obs, med-surg     - IV reglan 10mg  q6h x 24h; fluids, protonix, CLD     - no abdominal pain on exam, reports BM as recently as yesterday; will hold on ab imaging for now; if not improved by morning, will consider KUB vs CT ab/pelvis  Hx of SCC of tongue; currently in radiation treatment     - continue follow up with [facility]  Chronic hyponatremia     - Na+ stable w/o neurological changes, follow  HTN     - will have PRN BP meds until we can get her reliable tolerating PO  HLD     - resume statin when she can take PO  DVT prophylaxis: lovenox  Code Status: DNI  Family Communication: None at bedside.   Consults called: None.   Status is: Observation  The patient remains OBS appropriate and will d/c before 2 midnights.  Dispo: The patient is from: Home  Anticipated d/c is to: Home              Anticipated d/c date is: 1 day              Patient currently is not  medically stable to d/c.   Jonnie Finner DO [facility]  If 7PM-7AM, please contact night-coverage [email or web address]  04/19/2020, 7:36 AM

## 2020-04-19 NOTE — ED Provider Notes (Signed)
[name] DEPT Provider Note   CSN: [id number] Arrival date & time: 04/19/20  0028     History Chief Complaint  Patient presents with  . Vomiting    Erika [name] is a 74 y.o. 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 fib, anxiety, chronic hyponatremia who presents the emergency department with a chief complaint of vomiting.  She was seen in the ER earlier today with nausea and vomiting.  She was given Zofran and IV fluids and was successfully fluid challenge.  However, she reports that after being discharged home that she has been unable to keep down any sips of fluid or any food despite taking 2 doses of Zofran at home.  She has been unable to take any of her home medications today due to her symptoms.  She also endorses burning, central chest pain that is consistent with GERD as she has not been able to take her home medications.  She also reports that she has been feeling very lightheaded with standing today.    She denies fever, chills, shortness of breath, hematemesis, melena, hematochezia, chest tightness, cough, abdominal pain, diarrhea, headache, syncope.     The history is provided by the patient and medical records. No language interpreter was used.       Past Medical History:  Diagnosis Date  . A-fib ([place])   . Adrenal nodule ([place])   . Anxiety   . Arthritis   . Atrophic vaginitis   . Chronic hyponatremia   . Dysrhythmia   . Female climacteric state   . GERD (gastroesophageal reflux disease)   . Headache   . Hypercholesteremia   . Hyperlipidemia   . Hypertension   . Leukopenia   . PONV (postoperative nausea and vomiting)   . Recurrent UTI   . Right thyroid nodule   . Thrombocytopenia ([place])   . Tongue ulcer     Patient Active Problem List   Diagnosis Date Noted  . Intractable nausea and vomiting 04/19/2020  . Tongue cancer ([place])  02/16/2020  . Hyperlipidemia 12/14/2017  . Abnormal EKG 12/14/2017  . Thyroid nodule 12/14/2017  . A-fib ([place])   . AF (paroxysmal atrial fibrillation) ([place]) 10/15/2017  . Leukopenia 10/12/2017  . Thrombocytopenia ([place]) 10/12/2017  . Elevated transaminase level 10/12/2017  . Acute hyponatremia 10/12/2017  . Sepsis secondary to UTI ([place]) 10/12/2017  . Hypertension 10/12/2017  . Positive D dimer 10/12/2017  . Recurrent UTI 10/12/2017  . GERD (gastroesophageal reflux disease) 10/12/2017  . Arthritis 10/12/2017  . Tick bites 10/12/2017  . Status post total right knee replacement 03/31/2015  . Chronic cough 11/03/2012    Past Surgical History:  Procedure Laterality Date  . ESOPHAGOGASTRODUODENOSCOPY    . EXCISION OF TONGUE LESION N/A 02/16/2020   Procedure: EXCISION OF TONGUE LESION Partial Glossectomy;  Surgeon: [name], MD;  Location: [facility];  Service: ENT;  Laterality: N/A;  . HERNIA REPAIR     x2  groin  . JOINT REPLACEMENT    . PARTIAL GLOSSECTOMY  02/16/2020   EXCISION OF TONGUE LESION Partial Glossectomy (N/A Mouth)  . RADICAL NECK DISSECTION Left 02/16/2020   Procedure: RADICAL NECK DISSECTION Partial Left Neck Dissection;  Surgeon: [name], MD;  Location: [facility];  Service: ENT;  Laterality: Left;  . REPLACEMENT TOTAL KNEE     right knee  . SKIN CANCER EXCISION  05/2017   Excised from calf area  .  TONSILLECTOMY    . TUBAL LIGATION       OB History   No obstetric history on file.     Family History  Problem Relation Age of Onset  . Emphysema Sister   . Emphysema Brother   . Hypertension Mother   . Heart failure Mother   . Hypertension Father   . CVA Father     Social History   Tobacco Use  . Smoking status: Never Smoker  . Smokeless tobacco: Never Used  Vaping Use  . Vaping Use: Never used  Substance Use Topics  . Alcohol use: No  . Drug use: No    Home Medications Prior to Admission medications   Medication Sig Start Date End Date  Taking? Authorizing Provider  acetaminophen (TYLENOL) 500 MG tablet Take 1,000 mg by mouth every 6 (six) hours as needed for moderate pain or headache.    [provider]  amLODipine (NORVASC) 5 MG tablet Take 1 tablet (5 mg total) by mouth daily. Please make overdue appt with Dr. Tamala Julian before anymore refills. 3rd and Final Attempt Patient taking differently: Take 5 mg by mouth at bedtime. Please make overdue appt with Dr. Tamala Julian before anymore refills. 3rd and Final Attempt 07/17/19   Belva Crome, MD  aspirin EC 81 MG tablet Take 81 mg by mouth daily.    [provider]  Calcium Carb-Cholecalciferol (CALCIUM 600 + D PO) Take 1 tablet by mouth daily.    [provider]  Carboxymethylcellulose Sodium (ARTIFICIAL TEARS OP) Place 1 drop into both eyes daily as needed (dry eyes).    [provider]  cetirizine (ZYRTEC) 10 MG tablet Take 10 mg by mouth daily.    [provider]  diclofenac Sodium (VOLTAREN) 1 % GEL Apply 1 application topically 4 (four) times daily as needed (pain).    [provider]  esomeprazole (NEXIUM) 40 MG capsule Take 40 mg by mouth daily. 01/20/18   [provider]  famotidine (PEPCID) 20 MG tablet Take 20 mg by mouth at bedtime.     [provider]  fluticasone (FLONASE) 50 MCG/ACT nasal spray INSTILL 2 SPRAYS IN EACH NOSTRIL EVERY DAY. Patient taking differently: Place 2 sprays into both nostrils daily. 09/05/13   Clance, Armando Reichert, MD  HYDROcodone-acetaminophen (HYCET) 7.5-325 mg/15 ml solution Take 10-15 mLs by mouth every 4 (four) hours as needed for moderate pain. 04/17/20   Eppie Gibson, MD  lidocaine (XYLOCAINE) 2 % solution Patient: Mix 1part 2% viscous lidocaine, 1part H20. Swish & swallow 51mL of diluted mixture, 48min before meals and at bedtime, up to 5 times daily. 04/08/20   Eppie Gibson, MD  losartan (COZAAR) 100 MG tablet Take 1 tablet (100 mg total) by mouth daily. Please make overdue appt with  Dr. Tamala Julian before anymore refills. 2nd attempt 08/01/19   Belva Crome, MD  metoprolol tartrate (LOPRESSOR) 25 MG tablet Take 25 mg by mouth 2 (two) times daily.     [provider]  Multiple Vitamin (MULTIVITAMIN WITH MINERALS) TABS tablet Take 1 tablet by mouth daily.    [provider]  nitrofurantoin (MACRODANTIN) 50 MG capsule Take 50 mg by mouth at bedtime.     [provider]  ondansetron (ZOFRAN ODT) 4 MG disintegrating tablet Take 1 tablet (4 mg total) by mouth every 8 (eight) hours as needed for nausea or vomiting. 04/18/20   Luna Fuse, MD  Probiotic Product (RA PROBIOTIC GUMMIES) CHEW Chew 2 capsules by mouth daily.  [provider]  simvastatin (ZOCOR) 20 MG tablet Take 20 mg by mouth at bedtime.     [provider]  trolamine salicylate (ASPERCREME) 10 % cream Apply 1 application topically as needed for muscle pain.    [provider]    Allergies    Hydrochlorothiazide, Atorvastatin, Codeine, and Strawberry flavor  Review of Systems   Review of Systems  Constitutional: Negative for activity change, chills and fever.  HENT: Negative for congestion.   Eyes: Negative for visual disturbance.  Respiratory: Negative for cough, shortness of breath and wheezing.   Cardiovascular: Positive for chest pain. Negative for palpitations.  Gastrointestinal: Positive for nausea and vomiting. Negative for abdominal pain, anal bleeding, blood in stool, constipation and diarrhea.  Genitourinary: Negative for dysuria.  Musculoskeletal: Negative for back pain, myalgias, neck pain and neck stiffness.  Skin: Negative for rash.  Allergic/Immunologic: Negative for immunocompromised state.  Neurological: Positive for light-headedness. Negative for dizziness, syncope, weakness and headaches.  Psychiatric/Behavioral: Negative for confusion.    Physical Exam Updated Vital Signs BP 129/80   Pulse 79   Temp 98.5 F (36.9 C) (Oral)   Resp 15    Ht 5\' 3"  (1.6 m)   Wt 63.5 kg   SpO2 95%   BMI 24.80 kg/m   Physical Exam Vitals and nursing note reviewed.  Constitutional:      General: She is not in acute distress.    Appearance: She is not ill-appearing or toxic-appearing.  HENT:     Head: Normocephalic.     Mouth/Throat:     Mouth: Mucous membranes are dry.  Eyes:     Conjunctiva/sclera: Conjunctivae normal.  Cardiovascular:     Rate and Rhythm: Normal rate and regular rhythm.     Pulses: Normal pulses.     Heart sounds: Normal heart sounds. No murmur heard. No friction rub. No gallop.   Pulmonary:     Effort: Pulmonary effort is normal. No respiratory distress.     Breath sounds: No stridor. No wheezing, rhonchi or rales.  Chest:     Chest wall: No tenderness.  Abdominal:     General: There is no distension.     Palpations: Abdomen is soft.     Tenderness: There is no abdominal tenderness.     Comments: Abdomen is soft, nontender, nondistended.  Musculoskeletal:     Cervical back: Neck supple.  Skin:    General: Skin is warm.     Findings: No rash.  Neurological:     Mental Status: She is alert.  Psychiatric:        Behavior: Behavior normal.     ED Results / Procedures / Treatments   Labs (all labs ordered are listed, but only abnormal results are displayed) Labs Reviewed  COMPREHENSIVE METABOLIC PANEL - Abnormal; Notable for the following components:      Result Value   Sodium 130 (*)    Chloride 95 (*)    Glucose, Bld 133 (*)    All other components within normal limits  URINALYSIS, ROUTINE W REFLEX MICROSCOPIC - Abnormal; Notable for the following components:   APPearance HAZY (*)    Ketones, ur 20 (*)    Leukocytes,Ua LARGE (*)    Bacteria, UA RARE (*)    All other components within normal limits  RESP PANEL BY RT-PCR (FLU A&B, COVID) ARPGX2  LIPASE, BLOOD  CBC    EKG EKG Interpretation  Date/Time:  Friday April 19 2020 03:03:58 EST Ventricular Rate:  75 PR  Interval:    QRS  Duration: 97 QT Interval:  374 QTC Calculation: 418 R Axis:   14 Text Interpretation: Sinus rhythm Low voltage, precordial leads No significant change was found Confirmed by Molpus, John [id number]) on 04/19/2020 3:23:20 AM   Radiology No results found.  Procedures Procedures (including critical care time)  Medications Ordered in ED Medications  lactated ringers bolus 1,000 mL (0 mLs Intravenous Stopped 04/19/20 0418)  metoCLOPramide (REGLAN) injection 10 mg (10 mg Intravenous Given 04/19/20 0307)  pantoprazole (PROTONIX) injection 40 mg (40 mg Intravenous Given 04/19/20 0355)  ketorolac (TORADOL) 30 MG/ML injection 14 mg (14 mg Intravenous Given 04/19/20 0429)  0.9 %  sodium chloride infusion ( Intravenous New Bag/Given 04/19/20 [id number])    ED Course  I have reviewed the triage vital signs and the nursing notes.  Pertinent labs & imaging results that were available during my care of the patient were reviewed by me and considered in my medical decision making (see chart for details).    MDM Rules/Calculators/A&P                          74 year old female with a history of invasive squamous cell carcinoma of the tongue s/p surgery who is currently undergoing radiation, A. fib, anxiety, chronic hyponatremia who presents to the emergency department with nausea and vomiting.  She was seen in the ER earlier today for the same and treated with Zofran and fluids.  She was able to tolerate ice chips, but after being discharged home she had multiple episodes of nonbloody, nonbilious vomiting and has been unable to tolerate any food or fluids by mouth.  She has not taken any of her daily home medications today due to her symptoms.  She is hypertensive, but afebrile.  No tachypnea, hypoxia, or tachycardia.  She appears dehydrated on exam.  She has a benign abdomen.  She is endorsing some burning chest pain, but this is consistent with her home GERD and is likely due to not being able to take her home  medication versus side effects from her radiation treatment.  Labs and imaging have been reviewed and independently interpreted by me.  EKG without prolonged QTC.  Will trial patient with Reglan and give LR bolus.  She is hyponatremic at 130, unchanged from previous.  No hypokalemia.  UA from earlier ER visit is not concerning for infection.  We will plan to fluid challenge the patient.  If she is significantly improved and is able to tolerate p.o. fluids, she can be discharged home with Reglan.  However, if she fails p.o. challenge and is unable to be ambulated due to her lightheadedness, that she would likely need to be admitted to the hospital for intractable vomiting.  This plan was discussed with the patient and her son who is at bedside and they are agreeable with this plan.  She has had some improvement in nausea with Reglan.  However, patient is still feeling very poorly and would benefit from admission for intractable nausea and vomiting.  The patient was seen and evaluated by Dr. Florina Ou, attending physician who is in agreement with work-up and plan.  Consult to the hospitalist team and Dr. Marylyn Ishihara will accept the patient for admission. The patient appears reasonably stabilized for admission considering the current resources, flow, and capabilities available in the ED at this time, and I doubt any other [facility] requiring further screening and/or treatment in the ED prior to admission.  Final Clinical Impression(s) / ED Diagnoses Final diagnoses:  Intractable nausea and vomiting  Chronic hyponatremia    Rx / DC Orders ED Discharge Orders    None       Joanne Gavel, PA-C 04/19/20 0737    Molpus, Jenny Reichmann, MD 04/19/20 2225

## 2020-04-19 NOTE — ED Triage Notes (Signed)
Patient here from home reporting n/v ongoing. Seen yesterday for same. Radiation treatment for mouth cancer.

## 2020-04-19 NOTE — ED Notes (Signed)
PO challenge successful no vomiting

## 2020-04-20 DIAGNOSIS — Z79899 Other long term (current) drug therapy: Secondary | ICD-10-CM | POA: Diagnosis not present

## 2020-04-20 DIAGNOSIS — Z96651 Presence of right artificial knee joint: Secondary | ICD-10-CM | POA: Diagnosis present

## 2020-04-20 DIAGNOSIS — E876 Hypokalemia: Secondary | ICD-10-CM | POA: Diagnosis present

## 2020-04-20 DIAGNOSIS — C029 Malignant neoplasm of tongue, unspecified: Secondary | ICD-10-CM | POA: Diagnosis present

## 2020-04-20 DIAGNOSIS — Z8249 Family history of ischemic heart disease and other diseases of the circulatory system: Secondary | ICD-10-CM | POA: Diagnosis not present

## 2020-04-20 DIAGNOSIS — Z885 Allergy status to narcotic agent status: Secondary | ICD-10-CM | POA: Diagnosis not present

## 2020-04-20 DIAGNOSIS — I10 Essential (primary) hypertension: Secondary | ICD-10-CM | POA: Diagnosis present

## 2020-04-20 DIAGNOSIS — Z825 Family history of asthma and other chronic lower respiratory diseases: Secondary | ICD-10-CM | POA: Diagnosis not present

## 2020-04-20 DIAGNOSIS — K219 Gastro-esophageal reflux disease without esophagitis: Secondary | ICD-10-CM | POA: Diagnosis present

## 2020-04-20 DIAGNOSIS — E785 Hyperlipidemia, unspecified: Secondary | ICD-10-CM | POA: Diagnosis present

## 2020-04-20 DIAGNOSIS — E7800 Pure hypercholesterolemia, unspecified: Secondary | ICD-10-CM | POA: Diagnosis present

## 2020-04-20 DIAGNOSIS — Z20822 Contact with and (suspected) exposure to covid-19: Secondary | ICD-10-CM | POA: Diagnosis present

## 2020-04-20 DIAGNOSIS — E871 Hypo-osmolality and hyponatremia: Secondary | ICD-10-CM | POA: Diagnosis present

## 2020-04-20 DIAGNOSIS — Z823 Family history of stroke: Secondary | ICD-10-CM | POA: Diagnosis not present

## 2020-04-20 DIAGNOSIS — Z7982 Long term (current) use of aspirin: Secondary | ICD-10-CM | POA: Diagnosis not present

## 2020-04-20 DIAGNOSIS — Z888 Allergy status to other drugs, medicaments and biological substances status: Secondary | ICD-10-CM | POA: Diagnosis not present

## 2020-04-20 DIAGNOSIS — R112 Nausea with vomiting, unspecified: Secondary | ICD-10-CM | POA: Diagnosis not present

## 2020-04-20 DIAGNOSIS — F419 Anxiety disorder, unspecified: Secondary | ICD-10-CM | POA: Diagnosis present

## 2020-04-20 DIAGNOSIS — Z87440 Personal history of urinary (tract) infections: Secondary | ICD-10-CM | POA: Diagnosis not present

## 2020-04-20 LAB — CBC
HCT: 35.8 % — ABNORMAL LOW (ref 36.0–46.0)
Hemoglobin: 12.4 g/dL (ref 12.0–15.0)
MCH: 30.6 pg (ref 26.0–34.0)
MCHC: 34.6 g/dL (ref 30.0–36.0)
MCV: 88.4 fL (ref 80.0–100.0)
Platelets: 308 10*3/uL (ref 150–400)
RBC: 4.05 MIL/uL (ref 3.87–5.11)
RDW: 11.7 % (ref 11.5–15.5)
WBC: 5.1 10*3/uL (ref 4.0–10.5)
nRBC: 0 % (ref 0.0–0.2)

## 2020-04-20 LAB — COMPREHENSIVE METABOLIC PANEL
ALT: 24 U/L (ref 0–44)
AST: 21 U/L (ref 15–41)
Albumin: 3.7 g/dL (ref 3.5–5.0)
Alkaline Phosphatase: 116 U/L (ref 38–126)
Anion gap: 13 (ref 5–15)
BUN: 9 mg/dL (ref 8–23)
CO2: 22 mmol/L (ref 22–32)
Calcium: 8.9 mg/dL (ref 8.9–10.3)
Chloride: 92 mmol/L — ABNORMAL LOW (ref 98–111)
Creatinine, Ser: 0.5 mg/dL (ref 0.44–1.00)
GFR, Estimated: >60 mL/min (ref >60)
Glucose, Bld: 110 mg/dL — ABNORMAL HIGH (ref 70–99)
Potassium: 3.2 mmol/L — ABNORMAL LOW (ref 3.5–5.1)
Sodium: 127 mmol/L — ABNORMAL LOW (ref 135–145)
Total Bilirubin: 0.8 mg/dL (ref 0.3–1.2)
Total Protein: 7 g/dL (ref 6.5–8.1)

## 2020-04-20 MED ORDER — SCOPOLAMINE 1 MG/3DAYS TD PT72
1.0000 | MEDICATED_PATCH | TRANSDERMAL | Status: DC
  Administered 2020-04-20: 1.5 mg via TRANSDERMAL
  Filled 2020-04-20: qty 1

## 2020-04-20 MED ORDER — POTASSIUM CHLORIDE CRYS ER 20 MEQ PO TBCR: 40.0000 meq | EXTENDED_RELEASE_TABLET | ORAL | Status: DC

## 2020-04-20 MED ORDER — METOPROLOL TARTRATE 5 MG/5ML IV SOLN
5.0000 mg | Freq: Four times a day (QID) | INTRAVENOUS | Status: DC
  Administered 2020-04-20 – 2020-04-21 (×4): 5 mg via INTRAVENOUS
  Filled 2020-04-20 (×4): qty 5

## 2020-04-20 MED ORDER — POTASSIUM CHLORIDE 10 MEQ/100ML IV SOLN
10.0000 meq | INTRAVENOUS | Status: DC
  Administered 2020-04-20 (×5): 10 meq via INTRAVENOUS
  Filled 2020-04-20 (×2): qty 100

## 2020-04-20 MED ORDER — SODIUM CHLORIDE 0.9 % IV SOLN
INTRAVENOUS | Status: DC | PRN
  Administered 2020-04-20: 250 mL via INTRAVENOUS

## 2020-04-20 MED ORDER — PANTOPRAZOLE SODIUM 40 MG IV SOLR
40.0000 mg | Freq: Two times a day (BID) | INTRAVENOUS | Status: DC
  Administered 2020-04-20 – 2020-04-21 (×2): 40 mg via INTRAVENOUS
  Filled 2020-04-20 (×2): qty 40

## 2020-04-20 NOTE — Progress Notes (Signed)
PROGRESS NOTE    Erika [name]  TGY:[id number] DOB: January 14, 1946 DOA: 04/19/2020 PCP: [name], MD     Brief Narrative:  Erika [name] is a 74 y.o. 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 She reports that her N/V began 2 days ago. She did not have any abdominal pain, fevers, or change in stools. She tried a nausea pill, but it did not help. She came to the ED yesterday. She was given zofran and completed a successful PO challenge. She was discharged home on zofran afterwards. She states not long after she got home, she started having N/V again. She could not tolerate fluids. She felt it prudent to return to the ED.  New events last 24 hours / Subjective: No further nausea and vomiting this morning.  However, she states that she is unable to swallow anything due to mucus production in her throat.  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  Trial scopolamine patch -Remains on clear liquid diet  Chronic hyponatremia -IV fluid and monitor  Hypertension -Hold home PO meds, IV lopressor ordered   Hyperlipidemia -Hold statin   Hypokalemia -Replace, trend   DVT prophylaxis:  enoxaparin (LOVENOX) injection 40 mg Start: 04/19/20 2200  Code Status: DNI Family Communication: No family at bedside Disposition Plan:  Status is: Inpatient  Remains inpatient appropriate because:IV treatments appropriate due to intensity of illness or inability to take PO   Dispo: The patient is from: Home              Anticipated d/c is to: Home              Anticipated d/c date is: 1 day              Patient currently is not medically stable  to d/c.  Unable to tolerate p.o.  Continue IV fluids      Consultants:   None  Procedures:   None  Antimicrobials:  Anti-infectives (From admission, onward)   None        Objective: Vitals:   04/19/20 2149 04/20/20 0222 04/20/20 0615 04/20/20 0827  BP: (!) 157/81 (!) 147/83 (!) 146/61 (!) 156/92  Pulse: 76 71 85 80  Resp: 18 18 18 18   Temp: 98.5 F (36.9 C) 98.9 F (37.2 C) 98.2 F (36.8 C) 99.1 F (37.3 C)  TempSrc: Oral Oral Oral Oral  SpO2: 96% 95% 95% 97%  Weight:      Height:        Intake/Output Summary (Last 24 hours) at 04/20/2020 1012 Last data filed at 04/20/2020 0934 Gross per 24 hour  Intake 1574.04 ml  Output 1600 ml  Net -25.96 ml   Filed Weights   04/19/20 0042 04/19/20 1159  Weight: 63.5 kg 63.5 kg    Examination:  General exam: Appears calm and comfortable  Respiratory system: Clear to auscultation. Respiratory effort normal. No respiratory distress. No conversational dyspnea.  Cardiovascular system: S1 & S2 heard, RRR. No murmurs. No pedal edema. Gastrointestinal system: Abdomen is nondistended, soft and nontender. Normal bowel sounds heard. Central nervous system: Alert and oriented. No focal neurological deficits. Speech clear.  Extremities: Symmetric in  appearance  Skin: No rashes, lesions or ulcers on exposed skin  Psychiatry: Judgement and insight appear normal. Mood & affect appropriate.   Data Reviewed: I have personally reviewed following labs and imaging studies  CBC: Recent Labs  Lab 04/18/20 0650 04/19/20 0146 04/20/20 0501  WBC 6.7 5.1 5.1  HGB 13.8 13.0 12.4  HCT 40.5 37.5 35.8*  MCV 91.0 89.9 88.4  PLT 308 291 326   Basic Metabolic Panel: Recent Labs  Lab 04/18/20 0650 04/19/20 0146 04/20/20 0501  NA 130* 130* 127*  K 4.0 4.3 3.2*  CL 93* 95* 92*  CO2 25 24 22   GLUCOSE 143* 133* 110*  BUN 14 10 9   CREATININE 0.61 0.58 0.50  CALCIUM 9.5 9.1 8.9   GFR: Estimated Creatinine Clearance: 53.6 mL/min  (by C-G formula based on SCr of 0.5 mg/dL). Liver Function Tests: Recent Labs  Lab 04/18/20 0650 04/19/20 0146 04/20/20 0501  AST 31 30 21   ALT 42 31 24  ALKPHOS 151* 126 116  BILITOT 0.4 0.9 0.8  PROT 8.2* 7.8 7.0  ALBUMIN 4.4 3.9 3.7   Recent Labs  Lab 04/19/20 0146  LIPASE 22   No results for input(s): AMMONIA in the last 168 hours. Coagulation Profile: No results for input(s): INR, PROTIME in the last 168 hours. Cardiac Enzymes: No results for input(s): CKTOTAL, CKMB, CKMBINDEX, TROPONINI in the last 168 hours. BNP (last 3 results) No results for input(s): PROBNP in the last 8760 hours. HbA1C: No results for input(s): HGBA1C in the last 72 hours. CBG: No results for input(s): GLUCAP in the last 168 hours. Lipid Profile: No results for input(s): CHOL, HDL, LDLCALC, TRIG, CHOLHDL, LDLDIRECT in the last 72 hours. Thyroid Function Tests: No results for input(s): TSH, T4TOTAL, FREET4, T3FREE, THYROIDAB in the last 72 hours. Anemia Panel: No results for input(s): VITAMINB12, FOLATE, FERRITIN, TIBC, IRON, RETICCTPCT in the last 72 hours. Sepsis Labs: No results for input(s): PROCALCITON, LATICACIDVEN in the last 168 hours.  Recent Results (from the past 240 hour(s))  Resp Panel by RT-PCR (Flu A&B, Covid) Nasopharyngeal Swab     Status: None   Collection Time: 04/19/20  5:58 AM   Specimen: Nasopharyngeal Swab; Nasopharyngeal(NP) swabs in vial transport medium  Result Value Ref Range Status   SARS Coronavirus 2 by RT PCR NEGATIVE NEGATIVE Final    Comment: (NOTE) SARS-CoV-2 target nucleic acids are NOT DETECTED.  The SARS-CoV-2 RNA is generally detectable in upper respiratory specimens during the acute phase of infection. The lowest concentration of SARS-CoV-2 viral copies this assay can detect is 138 copies/mL. A negative result does not preclude SARS-Cov-2 infection and should not be used as the sole basis for treatment or other patient management decisions. A  negative result may occur with  improper specimen collection/handling, submission of specimen other than nasopharyngeal swab, presence of viral mutation(s) within the areas targeted by this assay, and inadequate number of viral copies(<138 copies/mL). A negative result must be combined with clinical observations, patient history, and epidemiological information. The expected result is Negative.  Fact Sheet for Patients:  EntrepreneurPulse.com.au  Fact Sheet for Healthcare Providers:  IncredibleEmployment.be  This test is no t yet approved or cleared by the [place] FDA and  has been authorized for detection and/or diagnosis of SARS-CoV-2 by FDA under an Emergency Use Authorization (EUA). This EUA will remain  in effect (meaning this test can be used) for the duration of the COVID-19 declaration under Section 564(b)(1) of the Act, 21 U.S.C.section 360bbb-3(b)(1), unless  the authorization is terminated  or revoked sooner.       Influenza A by PCR NEGATIVE NEGATIVE Final   Influenza B by PCR NEGATIVE NEGATIVE Final    Comment: (NOTE) The Xpert Xpress SARS-CoV-2/FLU/RSV plus assay is intended as an aid in the diagnosis of influenza from Nasopharyngeal swab specimens and should not be used as a sole basis for treatment. Nasal washings and aspirates are unacceptable for Xpert Xpress SARS-CoV-2/FLU/RSV testing.  Fact Sheet for Patients: BloggerCourse.com  Fact Sheet for Healthcare Providers: SeriousBroker.it  This test is not yet approved or cleared by the [place] FDA and has been authorized for detection and/or diagnosis of SARS-CoV-2 by FDA under an Emergency Use Authorization (EUA). This EUA will remain in effect (meaning this test can be used) for the duration of the COVID-19 declaration under Section 564(b)(1) of the Act, 21 U.S.C. section 360bbb-3(b)(1), unless the authorization  is terminated or revoked.  Performed at [facility], [address] [place], [place] [address]       Radiology Studies: No results found.    Scheduled Meds: . amLODipine  5 mg Oral QHS  . aspirin EC  81 mg Oral Daily  . enoxaparin (LOVENOX) injection  40 mg Subcutaneous Q24H  . fluticasone  2 spray Each Nare Daily  . losartan  100 mg Oral Daily  . melatonin  5 mg Oral QHS  . metoCLOPramide (REGLAN) injection  10 mg Intravenous Q6H  . metoprolol tartrate  25 mg Oral BID  . pantoprazole (PROTONIX) IV  40 mg Intravenous Q24H  . scopolamine  1 patch Transdermal Q72H  . simvastatin  20 mg Oral QHS  . traMADol  25 mg Oral Once   Continuous Infusions: . sodium chloride 75 mL/hr at 04/19/20 1400  . sodium chloride 250 mL (04/20/20 0931)  . potassium chloride Stopped (04/20/20 0935)     LOS: 0 days      Time spent: 35 minutes   Noralee Stain, DO [facility] 04/20/2020, 10:12 AM   Available via Epic secure chat 7am-7pm After these hours, please refer to coverage provider listed on [email or web address]

## 2020-04-21 DIAGNOSIS — R112 Nausea with vomiting, unspecified: Secondary | ICD-10-CM | POA: Diagnosis not present

## 2020-04-21 LAB — BASIC METABOLIC PANEL
Anion gap: 10 (ref 5–15)
BUN: 10 mg/dL (ref 8–23)
CO2: 22 mmol/L (ref 22–32)
Calcium: 8.7 mg/dL — ABNORMAL LOW (ref 8.9–10.3)
Chloride: 98 mmol/L (ref 98–111)
Creatinine, Ser: 0.62 mg/dL (ref 0.44–1.00)
GFR, Estimated: >60 mL/min (ref >60)
Glucose, Bld: 87 mg/dL (ref 70–99)
Potassium: 3.7 mmol/L (ref 3.5–5.1)
Sodium: 130 mmol/L — ABNORMAL LOW (ref 135–145)

## 2020-04-21 MED ORDER — PANTOPRAZOLE SODIUM 40 MG PO TBEC
40.0000 mg | DELAYED_RELEASE_TABLET | Freq: Every day | ORAL | Status: DC
  Administered 2020-04-21 – 2020-04-22 (×2): 40 mg via ORAL
  Filled 2020-04-21 (×2): qty 1

## 2020-04-21 MED ORDER — ACETAMINOPHEN 160 MG/5ML PO SOLN
500.0000 mg | Freq: Four times a day (QID) | ORAL | Status: DC | PRN
  Administered 2020-04-21 – 2020-04-22 (×3): 500 mg via ORAL
  Filled 2020-04-21 (×3): qty 20.3

## 2020-04-21 MED ORDER — SIMVASTATIN 20 MG PO TABS
20.0000 mg | ORAL_TABLET | Freq: Every day | ORAL | Status: DC
  Administered 2020-04-21: 20 mg via ORAL
  Filled 2020-04-21: qty 1

## 2020-04-21 MED ORDER — SODIUM CHLORIDE (PF) 0.9 % IJ SOLN
INTRAMUSCULAR | Status: AC
  Administered 2020-04-21: 10 mL
  Filled 2020-04-21: qty 10

## 2020-04-21 MED ORDER — AMLODIPINE BESYLATE 5 MG PO TABS
5.0000 mg | ORAL_TABLET | Freq: Every day | ORAL | Status: DC
  Administered 2020-04-21: 5 mg via ORAL
  Filled 2020-04-21: qty 1

## 2020-04-21 MED ORDER — ASPIRIN EC 81 MG PO TBEC
81.0000 mg | DELAYED_RELEASE_TABLET | Freq: Every day | ORAL | Status: DC
Start: 2020-04-21 — End: 2020-04-22
  Administered 2020-04-21 – 2020-04-22 (×2): 81 mg via ORAL
  Filled 2020-04-21 (×2): qty 1

## 2020-04-21 MED ORDER — METOPROLOL TARTRATE 25 MG PO TABS
25.0000 mg | ORAL_TABLET | Freq: Two times a day (BID) | ORAL | Status: DC
  Administered 2020-04-21 – 2020-04-22 (×3): 25 mg via ORAL
  Filled 2020-04-21 (×3): qty 1

## 2020-04-21 MED ORDER — LOSARTAN POTASSIUM 50 MG PO TABS
100.0000 mg | ORAL_TABLET | Freq: Every day | ORAL | Status: DC
  Administered 2020-04-21 – 2020-04-22 (×2): 100 mg via ORAL
  Filled 2020-04-21 (×2): qty 2

## 2020-04-21 MED ORDER — FAMOTIDINE 20 MG PO TABS
20.0000 mg | ORAL_TABLET | Freq: Every day | ORAL | Status: DC
  Administered 2020-04-21: 20 mg via ORAL
  Filled 2020-04-21: qty 1

## 2020-04-21 NOTE — Progress Notes (Signed)
PROGRESS NOTE    Erika [name]  OQH:[id number] DOB: 02/15/1946 DOA: 04/19/2020 PCP: [name], MD     Brief Narrative:  Erika [name] is a 74 y.o. 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 She reports that her N/V began 2 days ago. She did not have any abdominal pain, fevers, or change in stools. She tried a nausea pill, but it did not help. She came to the ED yesterday. She was given zofran and completed a successful PO challenge. She was discharged home on zofran afterwards. She states not long after she got home, she started having N/V again. She could not tolerate fluids. She felt it prudent to return to the ED.  New events last 24 hours / Subjective: States that scopolamine patch has helped with the mucus production.  Was able to tolerate clear liquid diet yesterday but wants to try something more solid today.  No further nausea or vomiting.  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  Trial scopolamine patch -Follows with Dr. Constance Holster  Chronic hyponatremia -IV fluid and monitor -Improved and remains stable  Hypertension -Resume home medications including Norvasc, Cozaar, Lopressor  Hyperlipidemia -Resume Zocor     DVT prophylaxis:  enoxaparin (LOVENOX) injection 40 mg Start: 04/19/20 2200  Code Status: DNI Family Communication: Son at bedside Disposition Plan:  Status is: Inpatient  Remains inpatient appropriate because:IV treatments appropriate due to intensity of illness or inability to take PO   Dispo: The patient is from: Home              Anticipated d/c is to: Home               Anticipated d/c date is: 1 day              Patient currently is not medically stable to d/c.  Advance diet today and monitor.  Hopeful discharge home 12/27      Consultants:   None  Procedures:   None  Antimicrobials:  Anti-infectives (From admission, onward)   None       Objective: Vitals:   04/20/20 2154 04/21/20 0003 04/21/20 0545 04/21/20 0621  BP: (!) 150/76 138/81 (!) 174/83 (!) 162/70  Pulse: 71 70 68 74  Resp: 15  14   Temp: 98.2 F (36.8 C)  97.6 F (36.4 C)   TempSrc:      SpO2: 97%  99%   Weight:      Height:        Intake/Output Summary (Last 24 hours) at 04/21/2020 1204 Last data filed at 04/21/2020 0545 Gross per 24 hour  Intake 1626.25 ml  Output 2000 ml  Net -373.75 ml   Filed Weights   04/19/20 0042 04/19/20 1159  Weight: 63.5 kg 63.5 kg    Examination: General exam: Appears calm and comfortable  Respiratory system: Clear to auscultation. Respiratory effort normal. Cardiovascular system: S1 & S2 heard, RRR. No pedal edema. Gastrointestinal system: Abdomen is nondistended, soft and nontender. Normal bowel sounds heard. Central nervous system: Alert and oriented. Non focal exam. Speech clear  Extremities: Symmetric in appearance bilaterally  Skin: No rashes, lesions or  ulcers on exposed skin  Psychiatry: Judgement and insight appear stable. Mood & affect appropriate.    Data Reviewed: I have personally reviewed following labs and imaging studies  CBC: Recent Labs  Lab 04/18/20 0650 04/19/20 0146 04/20/20 0501  WBC 6.7 5.1 5.1  HGB 13.8 13.0 12.4  HCT 40.5 37.5 35.8*  MCV 91.0 89.9 88.4  PLT 308 291 308   Basic Metabolic Panel: Recent Labs  Lab 04/18/20 0650 04/19/20 0146 04/20/20 0501 04/21/20 0456  NA 130* 130* 127* 130*  K 4.0 4.3 3.2* 3.7  CL 93* 95* 92* 98  CO2 25 24 22 22   GLUCOSE 143* 133* 110* 87  BUN 14 10 9 10   CREATININE 0.61 0.58 0.50 0.62  CALCIUM 9.5 9.1 8.9 8.7*   GFR: Estimated Creatinine  Clearance: 53.6 mL/min (by C-G formula based on SCr of 0.62 mg/dL). Liver Function Tests: Recent Labs  Lab 04/18/20 0650 04/19/20 0146 04/20/20 0501  AST 31 30 21   ALT 42 31 24  ALKPHOS 151* 126 116  BILITOT 0.4 0.9 0.8  PROT 8.2* 7.8 7.0  ALBUMIN 4.4 3.9 3.7   Recent Labs  Lab 04/19/20 0146  LIPASE 22   No results for input(s): AMMONIA in the last 168 hours. Coagulation Profile: No results for input(s): INR, PROTIME in the last 168 hours. Cardiac Enzymes: No results for input(s): CKTOTAL, CKMB, CKMBINDEX, TROPONINI in the last 168 hours. BNP (last 3 results) No results for input(s): PROBNP in the last 8760 hours. HbA1C: No results for input(s): HGBA1C in the last 72 hours. CBG: No results for input(s): GLUCAP in the last 168 hours. Lipid Profile: No results for input(s): CHOL, HDL, LDLCALC, TRIG, CHOLHDL, LDLDIRECT in the last 72 hours. Thyroid Function Tests: No results for input(s): TSH, T4TOTAL, FREET4, T3FREE, THYROIDAB in the last 72 hours. Anemia Panel: No results for input(s): VITAMINB12, FOLATE, FERRITIN, TIBC, IRON, RETICCTPCT in the last 72 hours. Sepsis Labs: No results for input(s): PROCALCITON, LATICACIDVEN in the last 168 hours.  Recent Results (from the past 240 hour(s))  Resp Panel by RT-PCR (Flu A&B, Covid) Nasopharyngeal Swab     Status: None   Collection Time: 04/19/20  5:58 AM   Specimen: Nasopharyngeal Swab; Nasopharyngeal(NP) swabs in vial transport medium  Result Value Ref Range Status   SARS Coronavirus 2 by RT PCR NEGATIVE NEGATIVE Final    Comment: (NOTE) SARS-CoV-2 target nucleic acids are NOT DETECTED.  The SARS-CoV-2 RNA is generally detectable in upper respiratory specimens during the acute phase of infection. The lowest concentration of SARS-CoV-2 viral copies this assay can detect is 138 copies/mL. A negative result does not preclude SARS-Cov-2 infection and should not be used as the sole basis for treatment or other patient  management decisions. A negative result may occur with  improper specimen collection/handling, submission of specimen other than nasopharyngeal swab, presence of viral mutation(s) within the areas targeted by this assay, and inadequate number of viral copies(<138 copies/mL). A negative result must be combined with clinical observations, patient history, and epidemiological information. The expected result is Negative.  Fact Sheet for Patients:   Fact Sheet for Healthcare Providers:  04/21/20  This test is no t yet approved or cleared by the 04/21/20 FDA and  has been authorized for detection and/or diagnosis of SARS-CoV-2 by FDA under an Emergency Use Authorization (EUA). This EUA will remain  in effect (meaning this test can be used) for the duration of the COVID-19 declaration under Section 564(b)(1) of the Act,  21 U.S.C.section 360bbb-3(b)(1), unless the authorization is terminated  or revoked sooner.       Influenza A by PCR NEGATIVE NEGATIVE Final   Influenza B by PCR NEGATIVE NEGATIVE Final    Comment: (NOTE) The Xpert Xpress SARS-CoV-2/FLU/RSV plus assay is intended as an aid in the diagnosis of influenza from Nasopharyngeal swab specimens and should not be used as a sole basis for treatment. Nasal washings and aspirates are unacceptable for Xpert Xpress SARS-CoV-2/FLU/RSV testing.  Fact Sheet for Patients: EntrepreneurPulse.com.au  Fact Sheet for Healthcare Providers: IncredibleEmployment.be  This test is not yet approved or cleared by the [place] FDA and has been authorized for detection and/or diagnosis of SARS-CoV-2 by FDA under an Emergency Use Authorization (EUA). This EUA will remain in effect (meaning this test can be used) for the duration of the COVID-19 declaration under Section 564(b)(1) of the Act, 21 U.S.C. section 360bbb-3(b)(1),  unless the authorization is terminated or revoked.  Performed at [facility], [place] [address], [place], [place] [address]       Radiology Studies: No results found.    Scheduled Meds: . enoxaparin (LOVENOX) injection  40 mg Subcutaneous Q24H  . fluticasone  2 spray Each Nare Daily  . metoCLOPramide (REGLAN) injection  10 mg Intravenous Q6H  . metoprolol tartrate  5 mg Intravenous Q6H  . pantoprazole (PROTONIX) IV  40 mg Intravenous Q12H  . scopolamine  1 patch Transdermal Q72H   Continuous Infusions: . sodium chloride 75 mL/hr at 04/21/20 0530  . sodium chloride 250 mL (04/20/20 0931)     LOS: 1 day      Time spent: 25 minutes   Dessa Phi, DO [facility] 04/21/2020, 12:04 PM   Available via Epic secure chat 7am-7pm After these hours, please refer to coverage provider listed on [email or web address]

## 2020-04-22 ENCOUNTER — Ambulatory Visit: Payer: Medicare Other

## 2020-04-22 DIAGNOSIS — R112 Nausea with vomiting, unspecified: Secondary | ICD-10-CM | POA: Diagnosis not present

## 2020-04-22 LAB — BASIC METABOLIC PANEL
Anion gap: 8 (ref 5–15)
BUN: 9 mg/dL (ref 8–23)
CO2: 23 mmol/L (ref 22–32)
Calcium: 8.3 mg/dL — ABNORMAL LOW (ref 8.9–10.3)
Chloride: 100 mmol/L (ref 98–111)
Creatinine, Ser: 0.59 mg/dL (ref 0.44–1.00)
GFR, Estimated: >60 mL/min (ref >60)
Glucose, Bld: 95 mg/dL (ref 70–99)
Potassium: 3.5 mmol/L (ref 3.5–5.1)
Sodium: 131 mmol/L — ABNORMAL LOW (ref 135–145)

## 2020-04-22 MED ORDER — METOCLOPRAMIDE HCL 10 MG PO TABS: 10.0000 mg | ORAL_TABLET | Freq: Three times a day (TID) | ORAL | 0 refills | Status: DC | PRN

## 2020-04-22 MED ORDER — ONDANSETRON 4 MG PO TBDP: 4.0000 mg | ORAL_TABLET | Freq: Three times a day (TID) | ORAL | 0 refills | Status: DC | PRN

## 2020-04-22 MED ORDER — SCOPOLAMINE 1 MG/3DAYS TD PT72: 1.0000 | MEDICATED_PATCH | TRANSDERMAL | 0 refills | Status: DC

## 2020-04-22 NOTE — Progress Notes (Signed)
OT Cancellation Note  Patient Details Name: Erika [name] MRN: [id number] DOB: 12/07/1945   Cancelled Treatment:    Reason Eval/Treat Not Completed: OT screened, no needs identified, will sign off. Upon arrival patient ambulating in room. Denies any concerns for returning home, is at independent level.   Marlyce Huge OT OT pager: [phone]   Carmelia Roller 04/22/2020, 7:52 AM

## 2020-04-22 NOTE — Discharge Summary (Signed)
Physician Discharge Summary  Erika [name] ALP:[id number] DOB: 1946/02/03 DOA: 04/19/2020  PCP: [name], MD  Admit date: 04/19/2020 Discharge date: 04/22/2020  Admitted From: Home Disposition:  Home  Recommendations for Outpatient Follow-up:  1. Follow up with PCP in 1 week 2. Follow up with Radiation oncology as scheduled next week  Discharge Condition: Stable CODE STATUS: DNI  Diet recommendation:  Diet Orders (From admission, onward)    Start     Ordered   04/21/20 0959  DIET SOFT Room service appropriate? Yes; Fluid consistency: Thin  Diet effective now       Question Answer Comment  Room service appropriate? Yes   Fluid consistency: Thin      04/21/20 3532          Brief/Interim Summary: Erika [name] is a 75 y.o.femalewith medical history significant ofSCC of the mouth w/ recent radiation who presents with intractable N/V. She reports that her N/V began 2 days ago. She did not have any abdominal pain, fevers, or change in stools. She tried a nausea pill, but it did not help. She came to the ED yesterday. She was given zofran and completed a successful PO challenge. She was discharged home on zofran afterwards. She states not long after she got home, she started having N/V again. She could not tolerate fluids. She felt it prudent to return to the ED.  She was treated with supportive care including IVF. Due to complaints of excessive mucus production, she was started on scopolamine patch with improvement of symptoms. Diet was advanced and patient had no further complaints of nausea, vomiting prior to discharge home.   Discharge Diagnoses:  Active Problems:   Intractable nausea and vomiting   Intractable nausea and vomiting -Continue Reglan, zofran PRN  -Nausea and vomiting has resolved   History of SCC of tongue, currently in radiation therapy -States that due to excessive mucus production, she is unable to swallow anything by mouth.  Trial scopolamine  patch -Follows with Dr. Pollyann Kennedy ENT as well as radiation oncology   Chronic hyponatremia -Improved and remains stable  Hypertension -Resume home medications including Norvasc, Cozaar, Lopressor  Hyperlipidemia -Resume Zocor   Discharge Instructions  Discharge Instructions    Call MD for:  difficulty breathing, headache or visual disturbances   Complete by: As directed    Call MD for:  extreme fatigue   Complete by: As directed    Call MD for:  persistant dizziness or light-headedness   Complete by: As directed    Call MD for:  persistant nausea and vomiting   Complete by: As directed    Call MD for:  severe uncontrolled pain   Complete by: As directed    Call MD for:  temperature >100.4   Complete by: As directed    Discharge instructions   Complete by: As directed    You were cared for by a hospitalist during your hospital stay. If you have any questions about your discharge medications or the care you received while you were in the hospital after you are discharged, you can call the unit and ask to speak with the hospitalist on call if the hospitalist that took care of you is not available. Once you are discharged, your primary care physician will handle any further medical issues. Please note that NO REFILLS for any discharge medications will be authorized once you are discharged, as it is imperative that you return to your primary care physician (or establish a relationship with a primary  care physician if you do not have one) for your aftercare needs so that they can reassess your need for medications and monitor your lab values.   Increase activity slowly   Complete by: As directed      Allergies as of 04/22/2020      Reactions   Hydrochlorothiazide Other (See Comments)   Brain swelling   Atorvastatin Other (See Comments)   cramps   Codeine Nausea And Vomiting   Strawberry Flavor Itching      Medication List    TAKE these medications   acetaminophen 500 MG  tablet Commonly known as: TYLENOL Take 1,000 mg by mouth every 6 (six) hours as needed for moderate pain or headache.   amLODipine 5 MG tablet Commonly known as: NORVASC Take 1 tablet (5 mg total) by mouth daily. Please make overdue appt with Dr. Tamala Julian before anymore refills. 3rd and Final Attempt What changed: when to take this   ARTIFICIAL TEARS OP Place 1 drop into both eyes daily as needed (dry eyes).   aspirin EC 81 MG tablet Take 81 mg by mouth daily.   CALCIUM 600 + D PO Take 1 tablet by mouth daily.   cetirizine 10 MG tablet Commonly known as: ZYRTEC Take 10 mg by mouth daily.   diclofenac Sodium 1 % Gel Commonly known as: VOLTAREN Apply 1 application topically 4 (four) times daily as needed (pain).   esomeprazole 40 MG capsule Commonly known as: NEXIUM Take 40 mg by mouth daily.   famotidine 20 MG tablet Commonly known as: PEPCID Take 20 mg by mouth at bedtime.   fluticasone 50 MCG/ACT nasal spray Commonly known as: FLONASE INSTILL 2 SPRAYS IN EACH NOSTRIL EVERY DAY. What changed: See the new instructions.   lidocaine 2 % solution Commonly known as: XYLOCAINE Patient: Mix 1part 2% viscous lidocaine, 1part H20. Swish & swallow 5mL of diluted mixture, 70min before meals and at bedtime, up to 5 times daily. What changed:   how much to take  how to take this  when to take this   losartan 100 MG tablet Commonly known as: COZAAR Take 1 tablet (100 mg total) by mouth daily. Please make overdue appt with Dr. Tamala Julian before anymore refills. 2nd attempt   metoCLOPramide 10 MG tablet Commonly known as: REGLAN Take 1 tablet (10 mg total) by mouth every 8 (eight) hours as needed for nausea, vomiting or refractory nausea / vomiting.   metoprolol tartrate 25 MG tablet Commonly known as: LOPRESSOR Take 25 mg by mouth 2 (two) times daily.   multivitamin with minerals Tabs tablet Take 1 tablet by mouth daily.   nitrofurantoin 50 MG capsule Commonly known as:  MACRODANTIN Take 50 mg by mouth at bedtime.   ondansetron 4 MG disintegrating tablet Commonly known as: Zofran ODT Take 1 tablet (4 mg total) by mouth every 8 (eight) hours as needed for nausea or vomiting.   RA Probiotic Gummies Chew Chew 2 capsules by mouth daily.   scopolamine 1 MG/3DAYS Commonly known as: TRANSDERM-SCOP Place 1 patch (1.5 mg total) onto the skin every 3 (three) days. Start taking on: April 23, 2020   simvastatin 20 MG tablet Commonly known as: ZOCOR Take 20 mg by mouth at bedtime.   trolamine salicylate 10 % cream Commonly known as: ASPERCREME Apply 1 application topically as needed for muscle pain.   vitamin B-12 100 MCG tablet Commonly known as: CYANOCOBALAMIN Take 100 mcg by mouth daily.       Allergies  Allergen  Reactions  . Hydrochlorothiazide Other (See Comments)    Brain swelling  . Atorvastatin Other (See Comments)    cramps  . Codeine Nausea And Vomiting  . Strawberry Flavor Itching    Consultations:  None    Procedures/Studies: No results found.     Discharge Exam: Vitals:   04/21/20 2239 04/22/20 0542  BP: (!) 149/81 (!) 162/66  Pulse: 61 61  Resp:  19  Temp:  98.2 F (36.8 C)  SpO2: 96% 98%    General: Pt is alert, awake, not in acute distress Cardiovascular: RRR, S1/S2 +, no edema Respiratory: CTA bilaterally, no wheezing, no rhonchi, no respiratory distress, no conversational dyspnea  Abdominal: Soft, NT, ND, bowel sounds + Extremities: no edema, no cyanosis Psych: Normal mood and affect, stable judgement and insight     The results of significant diagnostics from this hospitalization (including imaging, microbiology, ancillary and laboratory) are listed below for reference.     Microbiology: Recent Results (from the past 240 hour(s))  Resp Panel by RT-PCR (Flu A&B, Covid) Nasopharyngeal Swab     Status: None   Collection Time: 04/19/20  5:58 AM   Specimen: Nasopharyngeal Swab; Nasopharyngeal(NP) swabs  in vial transport medium  Result Value Ref Range Status   SARS Coronavirus 2 by RT PCR NEGATIVE NEGATIVE Final    Comment: (NOTE) SARS-CoV-2 target nucleic acids are NOT DETECTED.  The SARS-CoV-2 RNA is generally detectable in upper respiratory specimens during the acute phase of infection. The lowest concentration of SARS-CoV-2 viral copies this assay can detect is 138 copies/mL. A negative result does not preclude SARS-Cov-2 infection and should not be used as the sole basis for treatment or other patient management decisions. A negative result may occur with  improper specimen collection/handling, submission of specimen other than nasopharyngeal swab, presence of viral mutation(s) within the areas targeted by this assay, and inadequate number of viral copies(<138 copies/mL). A negative result must be combined with clinical observations, patient history, and epidemiological information. The expected result is Negative.  Fact Sheet for Patients:  BloggerCourse.comhttps://www.fda.gov/media/152166/download  Fact Sheet for Healthcare Providers:  SeriousBroker.ithttps://www.fda.gov/media/152162/download  This test is no t yet approved or cleared by the [place] FDA and  has been authorized for detection and/or diagnosis of SARS-CoV-2 by FDA under an Emergency Use Authorization (EUA). This EUA will remain  in effect (meaning this test can be used) for the duration of the COVID-19 declaration under Section 564(b)(1) of the Act, 21 U.S.C.section 360bbb-3(b)(1), unless the authorization is terminated  or revoked sooner.       Influenza A by PCR NEGATIVE NEGATIVE Final   Influenza B by PCR NEGATIVE NEGATIVE Final    Comment: (NOTE) The Xpert Xpress SARS-CoV-2/FLU/RSV plus assay is intended as an aid in the diagnosis of influenza from Nasopharyngeal swab specimens and should not be used as a sole basis for treatment. Nasal washings and aspirates are unacceptable for Xpert Xpress  SARS-CoV-2/FLU/RSV testing.  Fact Sheet for Patients: BloggerCourse.comhttps://www.fda.gov/media/152166/download  Fact Sheet for Healthcare Providers: SeriousBroker.ithttps://www.fda.gov/media/152162/download  This test is not yet approved or cleared by the [place] FDA and has been authorized for detection and/or diagnosis of SARS-CoV-2 by FDA under an Emergency Use Authorization (EUA). This EUA will remain in effect (meaning this test can be used) for the duration of the COVID-19 declaration under Section 564(b)(1) of the Act, 21 U.S.C. section 360bbb-3(b)(1), unless the authorization is terminated or revoked.  Performed at [facility], [address] [place], [place] [address]  Labs: BNP (last 3 results) No results for input(s): BNP in the last 8760 hours. Basic Metabolic Panel: Recent Labs  Lab 04/18/20 0650 04/19/20 0146 04/20/20 0501 04/21/20 0456 04/22/20 0403  NA 130* 130* 127* 130* 131*  K 4.0 4.3 3.2* 3.7 3.5  CL 93* 95* 92* 98 100  CO2 25 24 22 22 23   GLUCOSE 143* 133* 110* 87 95  BUN 14 10 9 10 9   CREATININE 0.61 0.58 0.50 0.62 0.59  CALCIUM 9.5 9.1 8.9 8.7* 8.3*   Liver Function Tests: Recent Labs  Lab 04/18/20 0650 04/19/20 0146 04/20/20 0501  AST 31 30 21   ALT 42 31 24  ALKPHOS 151* 126 116  BILITOT 0.4 0.9 0.8  PROT 8.2* 7.8 7.0  ALBUMIN 4.4 3.9 3.7   Recent Labs  Lab 04/19/20 0146  LIPASE 22   No results for input(s): AMMONIA in the last 168 hours. CBC: Recent Labs  Lab 04/18/20 0650 04/19/20 0146 04/20/20 0501  WBC 6.7 5.1 5.1  HGB 13.8 13.0 12.4  HCT 40.5 37.5 35.8*  MCV 91.0 89.9 88.4  PLT 308 291 308   Cardiac Enzymes: No results for input(s): CKTOTAL, CKMB, CKMBINDEX, TROPONINI in the last 168 hours. BNP: Invalid input(s): POCBNP CBG: No results for input(s): GLUCAP in the last 168 hours. D-Dimer No results for input(s): DDIMER in the last 72 hours. Hgb A1c No results for input(s): HGBA1C in the last 72 hours. Lipid  Profile No results for input(s): CHOL, HDL, LDLCALC, TRIG, CHOLHDL, LDLDIRECT in the last 72 hours. Thyroid function studies No results for input(s): TSH, T4TOTAL, T3FREE, THYROIDAB in the last 72 hours.  Invalid input(s): FREET3 Anemia work up No results for input(s): VITAMINB12, FOLATE, FERRITIN, TIBC, IRON, RETICCTPCT in the last 72 hours. Urinalysis    Component Value Date/Time   COLORURINE YELLOW 04/19/2020 0146   APPEARANCEUR HAZY (A) 04/19/2020 0146   LABSPEC 1.012 04/19/2020 0146   PHURINE 7.0 04/19/2020 0146   GLUCOSEU NEGATIVE 04/19/2020 0146   HGBUR NEGATIVE 04/19/2020 0146   BILIRUBINUR NEGATIVE 04/19/2020 0146   KETONESUR 20 (A) 04/19/2020 0146   PROTEINUR NEGATIVE 04/19/2020 0146   UROBILINOGEN 0.2 09/08/2008 1310   NITRITE NEGATIVE 04/19/2020 0146   LEUKOCYTESUR LARGE (A) 04/19/2020 0146   Sepsis Labs Invalid input(s): PROCALCITONIN,  WBC,  LACTICIDVEN Microbiology Recent Results (from the past 240 hour(s))  Resp Panel by RT-PCR (Flu A&B, Covid) Nasopharyngeal Swab     Status: None   Collection Time: 04/19/20  5:58 AM   Specimen: Nasopharyngeal Swab; Nasopharyngeal(NP) swabs in vial transport medium  Result Value Ref Range Status   SARS Coronavirus 2 by RT PCR NEGATIVE NEGATIVE Final    Comment: (NOTE) SARS-CoV-2 target nucleic acids are NOT DETECTED.  The SARS-CoV-2 RNA is generally detectable in upper respiratory specimens during the acute phase of infection. The lowest concentration of SARS-CoV-2 viral copies this assay can detect is 138 copies/mL. A negative result does not preclude SARS-Cov-2 infection and should not be used as the sole basis for treatment or other patient management decisions. A negative result may occur with  improper specimen collection/handling, submission of specimen other than nasopharyngeal swab, presence of viral mutation(s) within the areas targeted by this assay, and inadequate number of viral copies(<138 copies/mL). A  negative result must be combined with clinical observations, patient history, and epidemiological information. The expected result is Negative.  Fact Sheet for Patients:  EntrepreneurPulse.com.au  Fact Sheet for Healthcare Providers:  IncredibleEmployment.be  This test is no t yet  approved or cleared by the [place] and  has been authorized for detection and/or diagnosis of SARS-CoV-2 by FDA under an Emergency Use Authorization (EUA). This EUA will remain  in effect (meaning this test can be used) for the duration of the COVID-19 declaration under Section 564(b)(1) of the Act, 21 U.S.C.section 360bbb-3(b)(1), unless the authorization is terminated  or revoked sooner.       Influenza A by PCR NEGATIVE NEGATIVE Final   Influenza B by PCR NEGATIVE NEGATIVE Final    Comment: (NOTE) The Xpert Xpress SARS-CoV-2/FLU/RSV plus assay is intended as an aid in the diagnosis of influenza from Nasopharyngeal swab specimens and should not be used as a sole basis for treatment. Nasal washings and aspirates are unacceptable for Xpert Xpress SARS-CoV-2/FLU/RSV testing.  Fact Sheet for Patients: EntrepreneurPulse.com.au  Fact Sheet for Healthcare Providers: IncredibleEmployment.be  This test is not yet approved or cleared by the [place] FDA and has been authorized for detection and/or diagnosis of SARS-CoV-2 by FDA under an Emergency Use Authorization (EUA). This EUA will remain in effect (meaning this test can be used) for the duration of the COVID-19 declaration under Section 564(b)(1) of the Act, 21 U.S.C. section 360bbb-3(b)(1), unless the authorization is terminated or revoked.  Performed at [facility], [place] [address], [place], [place] [address]      Patient was seen and examined on the day of discharge and was found to be in stable condition. Time coordinating discharge: 20  minutes including assessment and coordination of care, as well as examination of the patient.   SIGNED:  Dessa Phi, DO [facility] 04/22/2020, 9:19 AM

## 2020-04-22 NOTE — Progress Notes (Signed)
PT Cancellation Note  Patient Details Name: Erika [name] MRN: [id number] DOB: 12-15-1945   Cancelled Treatment:    Reason Eval/Treat Not Completed: PT screened, no needs identified, will sign off. Pt amb I'ly per OT note   [facility] 04/22/2020, 9:20 AM

## 2020-04-23 ENCOUNTER — Ambulatory Visit: Payer: Medicare Other

## 2020-04-23 ENCOUNTER — Telehealth: Payer: Self-pay | Admitting: Dietician

## 2020-04-23 NOTE — Telephone Encounter (Signed)
Telephone follow-up completed with patient with new diagnosis of tongue cancer s/p left partial glossectomy and left neck dissection. She is followed by Dr. Basilio Cairo for radiation therapy, final treatment scheduled January 4.  [facility]/24-12/27[facility] admission for intractable nausea and vomiting  Medications: Calcium 600 +D, Reglan, MVI, 2% viscous Lidocaine, Zofran, Scopolamine patch, Probiotic, B12  Labs: Na 131 on 12/27  Weight 140 lb on 12/24 decreased from 143 lbs 4.8 oz on 11/23. Patient reports last weight stated during hospital admission. Patient weighed 137 lbs on home scale today, however reports scales are ~5 lbs off and actual weight closer to 142 lbs.  Patient reports not doing too good today, having some nausea and not much of an appetite over the past few days. She has had a milkshake mixed with Pure Protein today. Supplement provides 140 kcal and 30 grams of protein. She is taking Zofran for nausea, reports mucositis is improving with Scopolamine patch.   Nutrition diagnosis: Food and nutrition related knowledge deficit related to tongue cancer and associated treatments as evidenced by no prior need for nutrition related information. -ongoing  Intervention: Encouraged pt to utilize fact sheets for soft and moist high protein foods Discussed trying to eat small frequent meals/snacks Educated on types of supplements, recommended switching to Ensure Enlive for more nutrient dense supplement. Patient agreeable to drinking 2-3 Ensure/day Will provide complimentary case of Ensure to be available for pick up at registration desk at next scheduled appointment on Jan 4   Monitoring, evaluation, goals: Patient will consume adequate nutrition to maintain weight Patient will contact RD with questions or concerns.  RD will follow-up via telephone call with patient weekly.

## 2020-04-24 ENCOUNTER — Ambulatory Visit: Payer: Medicare Other

## 2020-04-25 ENCOUNTER — Ambulatory Visit: Payer: Medicare Other

## 2020-04-27 HISTORY — PX: HAMMER TOE SURGERY: SHX385

## 2020-04-29 ENCOUNTER — Ambulatory Visit
Admission: RE | Admit: 2020-04-29 | Discharge: 2020-04-29 | Disposition: A | Discharge status: Home / Self Care | Payer: Medicare Other | Source: Ambulatory Visit | Attending: Radiation Oncology | Admitting: Radiation Oncology

## 2020-04-29 ENCOUNTER — Other Ambulatory Visit: Payer: Self-pay

## 2020-04-29 ENCOUNTER — Ambulatory Visit: Payer: Medicare Other

## 2020-04-29 ENCOUNTER — Telehealth: Payer: Self-pay

## 2020-04-29 DIAGNOSIS — C021 Malignant neoplasm of border of tongue: Secondary | ICD-10-CM | POA: Diagnosis not present

## 2020-04-29 DIAGNOSIS — R5381 Other malaise: Secondary | ICD-10-CM

## 2020-04-29 DIAGNOSIS — C029 Malignant neoplasm of tongue, unspecified: Secondary | ICD-10-CM

## 2020-04-29 LAB — BASIC METABOLIC PANEL - CANCER CENTER ONLY
Anion gap: 7 (ref 5–15)
BUN: 20 mg/dL (ref 8–23)
CO2: 29 mmol/L (ref 22–32)
Calcium: 9.6 mg/dL (ref 8.9–10.3)
Chloride: 101 mmol/L (ref 98–111)
Creatinine: 0.74 mg/dL (ref 0.44–1.00)
GFR, Estimated: >60 mL/min (ref >60)
Glucose, Bld: 134 mg/dL — ABNORMAL HIGH (ref 70–99)
Potassium: 4.2 mmol/L (ref 3.5–5.1)
Sodium: 137 mmol/L (ref 135–145)

## 2020-04-29 MED ORDER — SONAFINE EX EMUL
1.0000 "application " | Freq: Two times a day (BID) | CUTANEOUS | Status: DC
  Administered 2020-04-29: 1 via TOPICAL

## 2020-04-29 NOTE — Telephone Encounter (Signed)
Called patient to let her know that based on BMP from today, Dr. Basilio Cairo would like patient to increase her clear fluid intake by 16oz/day. Patient verbalized understanding and agreement, and denied any other needs at this time. She knows to call clinic back should she have any questions/concerns.

## 2020-04-29 NOTE — Telephone Encounter (Signed)
-----   Message from Lonie Peak, MD sent at 04/29/2020  4:39 PM EST ----- Hi! Bun / Cr ratio is a little elevated implying she could push the clear fluids a bit more. Can you let her know to increase clear fluid intake by 16oz further/day?  Thanks!  ----- Message ----- From: Leory Plowman, Lab In [place] Sent: 04/29/2020  10:59 AM EST To: Lonie Peak, MD

## 2020-04-30 ENCOUNTER — Ambulatory Visit: Payer: Medicare Other

## 2020-04-30 ENCOUNTER — Telehealth: Payer: Self-pay | Admitting: Dietician

## 2020-04-30 NOTE — Telephone Encounter (Signed)
Telephone follow-up completed with patient with new diagnosis of tongue cancer s/p left partial glossectomy and left neck dissection. She is followed by Dr. Basilio Cairo for radiation therapy, final treatment completed January 3.  Medications: Calcium 600 +D, Reglan, MVI, 2% viscous Lidocaine, Zofran, Scopolamine patch, Probiotic, B12  Labs: Glucose 134 on 1/3  Weight 140 lb on 12/24 decreased from 143 lbs 4.8 oz on 11/23.   Patient reports feeling better this week. She is glad to be finished with treatments. She has been consuming only liquids, confirms picking up complimentary case of Ensure on 1/03. Reports drinking 4-5 Ensure and 3-4 (8oz) glasses of water each day. Today patient has had a cup of coffee, a milkshake made with ice cream and Ensure, and 2 glasses of water. She had a bowel movement today, reports some constipation, started fiber supplement.   Nutrition diagnosis: Food and nutrition related knowledge deficit related to tongue cancer and associated treatments as evidenced by no prior need for nutrition related information. -ongoing  Intervention: Patient encouraged to aim for 5 Ensure Enlive daily (1750 kcal, 100 grams protein) Pt will continue to prepare milkshakes for added calories Discussed importance of adequate hydration, increasing water intake with fiber supplement Encouraged soft and moist foods as tolerated   Monitoring, evaluation, goals: Patient will consume adequate nutrition to maintain weight Patient will contact RD with questions or concerns.  RD will follow-upviatelephone call with patient Jan 11.  Lars Masson, RD, LDN Clinical Nutrition After Hours/Weekend Pager # in Amion

## 2020-05-01 ENCOUNTER — Ambulatory Visit: Payer: Medicare Other

## 2020-05-02 ENCOUNTER — Ambulatory Visit: Payer: Medicare Other | Attending: Radiation Oncology

## 2020-05-02 ENCOUNTER — Other Ambulatory Visit: Payer: Self-pay

## 2020-05-02 ENCOUNTER — Ambulatory Visit: Payer: Medicare Other

## 2020-05-02 DIAGNOSIS — C029 Malignant neoplasm of tongue, unspecified: Secondary | ICD-10-CM | POA: Insufficient documentation

## 2020-05-02 DIAGNOSIS — M25612 Stiffness of left shoulder, not elsewhere classified: Secondary | ICD-10-CM | POA: Diagnosis not present

## 2020-05-02 DIAGNOSIS — R1311 Dysphagia, oral phase: Secondary | ICD-10-CM | POA: Insufficient documentation

## 2020-05-02 DIAGNOSIS — I890 Lymphedema, not elsewhere classified: Secondary | ICD-10-CM | POA: Diagnosis not present

## 2020-05-02 DIAGNOSIS — J387 Other diseases of larynx: Secondary | ICD-10-CM | POA: Diagnosis not present

## 2020-05-02 DIAGNOSIS — R293 Abnormal posture: Secondary | ICD-10-CM | POA: Insufficient documentation

## 2020-05-02 DIAGNOSIS — E871 Hypo-osmolality and hyponatremia: Secondary | ICD-10-CM | POA: Diagnosis not present

## 2020-05-02 NOTE — Therapy (Signed)
[facility] [address] [place], [place], [address] Phone: [phone]   Fax:  [phone]  Speech Language Pathology Treatment  Patient Details  Name: Erika [name] MRN: [id number] Date of Birth: Apr 26, 1946 Referring Provider (SLP): Eppie Gibson, MD   Encounter Date: 05/02/2020   End of Session - 05/02/20 1426    Visit Number 2    Number of Visits 7    Date for SLP Re-Evaluation 06/26/20    SLP Start Time 6063    SLP Stop Time  1355    SLP Time Calculation (min) 40 min    Activity Tolerance Patient tolerated treatment well           Past Medical History:  Diagnosis Date  . A-fib ([place])   . Adrenal nodule ([place])   . Anxiety   . Arthritis   . Atrophic vaginitis   . Chronic hyponatremia   . Dysrhythmia   . Female climacteric state   . GERD (gastroesophageal reflux disease)   . Headache   . Hypercholesteremia   . Hyperlipidemia   . Hypertension   . Leukopenia   . PONV (postoperative nausea and vomiting)   . Recurrent UTI   . Right thyroid nodule   . Thrombocytopenia ([place])   . Tongue ulcer     Past Surgical History:  Procedure Laterality Date  . ESOPHAGOGASTRODUODENOSCOPY    . EXCISION OF TONGUE LESION N/A 02/16/2020   Procedure: EXCISION OF TONGUE LESION Partial Glossectomy;  Surgeon: [name], MD;  Location: [facility];  Service: ENT;  Laterality: N/A;  . HERNIA REPAIR     x2  groin  . JOINT REPLACEMENT    . PARTIAL GLOSSECTOMY  02/16/2020   EXCISION OF TONGUE LESION Partial Glossectomy (N/A Mouth)  . RADICAL NECK DISSECTION Left 02/16/2020   Procedure: RADICAL NECK DISSECTION Partial Left Neck Dissection;  Surgeon: [name], MD;  Location: [facility];  Service: ENT;  Laterality: Left;  . REPLACEMENT TOTAL KNEE     right knee  . SKIN CANCER EXCISION  05/2017   Excised from calf area  . TONSILLECTOMY    . TUBAL LIGATION      There were no vitals filed for this visit.   Subjective Assessment  - 05/02/20 1318    Currently in Pain? Yes    Pain Score 6     Pain Location Mouth    Pain Orientation Left    Pain Descriptors / Indicators Burning    Pain Onset More than a month ago    Pain Frequency Constant    Aggravating Factors  eating/drinking    Pain Relieving Factors tylenol                 ADULT SLP TREATMENT - 05/02/20 1324      General Information   Behavior/Cognition Alert;Cooperative;Pleasant mood      Treatment Provided   Treatment provided Dysphagia      Dysphagia Treatment   Temperature Spikes Noted No    Oral Cavity - Dentition Adequate natural dentition    Treatment Methods Skilled observation;Compensation strategy training;Patient/caregiver education    Patient observed directly with PO's Yes    Type of PO's observed Thin liquids    Oral Phase Signs & Symptoms Other (comment)   pt reports attempting to keep POs on right - ? reduced lingual cohesion/propulsion on lt   Pharyngeal Phase Signs & Symptoms Other (comment)   none today with water, pt does state she has some coughing "  Maybe once a day" with Ensure; "water, no, not so much"   Other treatment/comments Pt admitted for approx 3 days over Christmas due to N/V. "It was like before they put this patch on in the hospital, I was drowning in my mucous." SLP to watch this. Pt states she needs to swallow on rt due to inability of lt side for (SLP assumes) reduced bolus formation/propulison.Pt has been adhering to recommended frequency of HEP, mostly. Req'd occasional min A today with HEP. She req'd cues for rationale for HEP.      Assessment / Recommendations / Plan   Plan Continue with current plan of care      Dysphagia Recommendations   Diet recommendations --   as tolerated   Liquids provided via --   bottle   Medication Administration --   as tolerated     Progression Toward Goals   Progression toward goals Progressing toward goals            SLP Education - 05/02/20 1425    Education  Details food journal, rationale for HEP    Person(s) Educated Patient    Methods Explanation    Comprehension Verbalized understanding            SLP Short Term Goals - 05/02/20 1348      SLP SHORT TERM GOAL #1   Title pt will complete HEP with rare min A    Time 1    Period --   session/s, for all STGs   Status Partially Met   occasional min A     SLP SHORT TERM GOAL #2   Title pt will tell SLP why pt is completing HEP with modified independence    Time 1    Status On-going      SLP SHORT TERM GOAL #3   Title pt will tell SLP how a food journal could hasten return to a more normalized diet    Status Achieved      SLP SHORT TERM GOAL #4   Title pt will describe 3 overt s/s aspiration PNA with modified independence    Time 1    Status On-going            SLP Long Term Goals - 05/02/20 1350      SLP LONG TERM GOAL #1   Title pt will complete HEP with modified independence over two visits    Time 3    Period --   sessions, for all LTGs   Status On-going      SLP LONG TERM GOAL #2   Title pt will describe how to modify HEP over time, and the timeline associated with reduction in HEP frequency with modified independence over two sessions    Time 5    Status On-going            Plan - 05/02/20 1426    Clinical Impression Statement At this time pt swallowing is deemed [facility] with thin liquids/liquids using small sip size, swallow on rt.  Pt self-exited from radiation course on 04-21-20. SLP reviewed Adylene's HEP for dysphagia and pt completed each exercise on their own with occasional min A. There are no overt s/s aspiration reported by pt at this time. Data indicate that pt's swallow ability will likely decrease over the course of radiation therapy and could very well decline over time following conclusion of their radiation therapy due to muscle disuse atrophy and/or muscle fibrosis. Pt will cont to need to be seen by  SLP in order to assess safety of PO intake, assess the  need for recommending any objective swallow assessment, and ensuring pt correctly completes the individualized HEP.    Speech Therapy Frequency --   once every approx 4 weeks   Duration --   7 total sessions   Treatment/Interventions Aspiration precaution training;Pharyngeal strengthening exercises;Diet toleration management by SLP;Trials of upgraded texture/liquids;Patient/family education;Compensatory strategies;SLP instruction and feedback    Potential to Achieve Goals Good    SLP Home Exercise Plan provided    Consulted and Agree with Plan of Care Patient           Patient will benefit from skilled therapeutic intervention in order to improve the following deficits and impairments:   Dysphagia, oral phase    Problem List Patient Active Problem List   Diagnosis Date Noted  . Intractable nausea and vomiting 04/19/2020  . Tongue cancer ([place]) 02/16/2020  . Hyperlipidemia 12/14/2017  . Abnormal EKG 12/14/2017  . Thyroid nodule 12/14/2017  . A-fib ([place])   . AF (paroxysmal atrial fibrillation) ([place]) 10/15/2017  . Leukopenia 10/12/2017  . Thrombocytopenia ([place]) 10/12/2017  . Elevated transaminase level 10/12/2017  . Acute hyponatremia 10/12/2017  . Sepsis secondary to UTI ([place]) 10/12/2017  . Hypertension 10/12/2017  . Positive D dimer 10/12/2017  . Recurrent UTI 10/12/2017  . GERD (gastroesophageal reflux disease) 10/12/2017  . Arthritis 10/12/2017  . Tick bites 10/12/2017  . Status post total right knee replacement 03/31/2015  . Chronic cough 11/03/2012    Lalita Ebel ,MS, CCC-SLP  05/02/2020, 2:31 PM  [facility] [address] [place], [place], [address] Phone: [phone]   Fax:  [phone]   Name: Erika [name] MRN: [id number] Date of Birth: 01-Oct-1945

## 2020-05-03 ENCOUNTER — Ambulatory Visit: Payer: Medicare Other

## 2020-05-06 ENCOUNTER — Ambulatory Visit: Payer: Medicare Other

## 2020-05-07 ENCOUNTER — Telehealth: Payer: Self-pay | Admitting: Dietician

## 2020-05-07 ENCOUNTER — Ambulatory Visit: Payer: Medicare Other

## 2020-05-07 NOTE — Telephone Encounter (Signed)
Telephone follow-up completed with patient with new diagnosis of tongue cancer s/p left partial glossectomy and left neck dissection. She is followed by Dr. Isidore Moos for radiation therapy, final treatment completed January 3.  Medications: Calcium 600 +D, Reglan, MVI, 2% viscous Lidocaine, Zofran, Scopolamine patch, Probiotic, B12  Labs: Glucose 134 on 1/3  Weight 140 lb on 12/24 decreased from 143 lbs 4.8 oz on 11/23.   Patient reports weights have increased 1 lb on her home scale, now 138 lb from 137 lb on 12/28  Pt reports doing a little better each week. She continues to work with outpatient SLP, her last appointment on 1/06 went well and feels like she is improving. She is introducing some soft foods (peaches, chx noodle/vegetable soups) back into daily intake as well as 4 Ensure, 4 (8oz) glasses of water, tea, and coffee. Today she has had chicken noodle soup, 3 Ensure Enlive, 2 glasses of water, tea, and a cup of coffee. She continues to use Metamucil daily for mild constipation, last bowel movement was yesterday.   Nutrition diagnosis: Food and nutrition related knowledge deficit related to tongue cancer and associated treatments as evidenced by no prior need for nutrition related information. -improving  Intervention: Continue drinking 4-5 Ensure Enlive daily Pt reports having coupons at this time Encouraged continuing to add soft and moist foods as tolerated  Monitoring, evaluation, goals: Patient will consume adequate nutrition to maintain weight Patient will consume adequate fluids to prevent dehydration Nutrition follow-up via telephone to be scheduled  Patient will contact RD with questions or concerns  Lajuan Lines, RD, LDN Clinical Nutrition After Hours/Weekend Pager # in [place]  Patient will contact RD with questions or concerns.

## 2020-05-21 ENCOUNTER — Ambulatory Visit: Payer: Medicare Other

## 2020-05-21 ENCOUNTER — Other Ambulatory Visit: Payer: Self-pay

## 2020-05-21 DIAGNOSIS — R293 Abnormal posture: Secondary | ICD-10-CM

## 2020-05-21 DIAGNOSIS — C029 Malignant neoplasm of tongue, unspecified: Secondary | ICD-10-CM | POA: Diagnosis not present

## 2020-05-21 DIAGNOSIS — I890 Lymphedema, not elsewhere classified: Secondary | ICD-10-CM | POA: Diagnosis not present

## 2020-05-21 DIAGNOSIS — M25612 Stiffness of left shoulder, not elsewhere classified: Secondary | ICD-10-CM

## 2020-05-21 DIAGNOSIS — R1311 Dysphagia, oral phase: Secondary | ICD-10-CM | POA: Diagnosis not present

## 2020-05-21 NOTE — Patient Instructions (Signed)

## 2020-05-21 NOTE — Therapy (Addendum)
[place], [place], [address] Phone: [phone]   Fax:  [phone]  Physical Therapy Treatment  Patient Details  Name: Erika [name] MRN: [id number] Date of Birth: 11-14-1945 Referring Provider (PT): Reita May Date: 05/21/2020   PT End of Session - 05/21/20 1003    Visit Number 5    Number of Visits 13    Date for PT Re-Evaluation 05/09/20    PT Start Time 0906    PT Stop Time 1000    PT Time Calculation (min) 54 min    Activity Tolerance Patient tolerated treatment well    Behavior During Therapy [facility] for tasks assessed/performed           Past Medical History:  Diagnosis Date  . A-fib ([place])   . Adrenal nodule ([place])   . Anxiety   . Arthritis   . Atrophic vaginitis   . Chronic hyponatremia   . Dysrhythmia   . Female climacteric state   . GERD (gastroesophageal reflux disease)   . Headache   . Hypercholesteremia   . Hyperlipidemia   . Hypertension   . Leukopenia   . PONV (postoperative nausea and vomiting)   . Recurrent UTI   . Right thyroid nodule   . Thrombocytopenia ([place])   . Tongue ulcer     Past Surgical History:  Procedure Laterality Date  . ESOPHAGOGASTRODUODENOSCOPY    . EXCISION OF TONGUE LESION N/A 02/16/2020   Procedure: EXCISION OF TONGUE LESION Partial Glossectomy;  Surgeon: [name], MD;  Location: [facility];  Service: ENT;  Laterality: N/A;  . HERNIA REPAIR     x2  groin  . JOINT REPLACEMENT    . PARTIAL GLOSSECTOMY  02/16/2020   EXCISION OF TONGUE LESION Partial Glossectomy (N/A Mouth)  . RADICAL NECK DISSECTION Left 02/16/2020   Procedure: RADICAL NECK DISSECTION Partial Left Neck Dissection;  Surgeon: [name], MD;  Location: [facility];  Service: ENT;  Laterality: Left;  . REPLACEMENT TOTAL KNEE     right knee  . SKIN CANCER EXCISION  05/2017   Excised from calf area  . TONSILLECTOMY    . TUBAL LIGATION      There were no vitals filed for this  visit.   Subjective Assessment - 05/21/20 0917    Subjective They had to stop my radiation because I ended up in the hospital. I was drowning in my saliva. This week I am finally feeling a little better and my neck lymphedema is doing pretty good. I've continued with the self MLD and that's helped alot. My Lt shoulder is my biggest issue now. It's some better but I don't know if it will ever be back to what it was before surgery since it wasn't great to begin with.    Pertinent History invasive squamous cell carcinoma of tongue stage III; 9/24- CT neck, 9/27- PET, 10/22-left partial glossectomy and selective left neck dissection, 22 lymph nodes dissected and all were negative for metastatic carcinoma- will undergo radiation    Patient Stated Goals to gain info from providers    Currently in Pain? No/denies   stiff in Lt shoulder some in morning             [facility] PT Assessment - 05/21/20 0001      AROM   Left Shoulder Extension 59 Degrees    Left Shoulder Flexion 85 Degrees    Left Shoulder ABduction 46 Degrees    Left Shoulder Internal  Rotation --   to belly   Left Shoulder External Rotation 56 Degrees                         [facility] Adult PT Treatment/Exercise - 05/21/20 0001      Exercises   Other Exercises  Spent alot of time reviewing pts current HEP and what she has been doing and what is best to cont.      Shoulder Exercises: Isometric Strengthening   Flexion 3X5"   in doorway, Lt shoulder   Extension 3X5"   in doorway, Lt shoulder   External Rotation 3X5"   in doorway, Lt shoulder   ABduction 3X5"   in doorway, Lt shoulder     Manual Therapy   Soft tissue mobilization In supine with HOB elevated to Lt cervical muscles, UT and TMJ area to tolerance and suboccipital release then resumed    Passive ROM In Supine during STM into bil side bend and bil rotation to tolerance.                  PT Education - 05/21/20 1236    Education Details Isometrics to  Lt shoulder in doorway    Person(s) Educated Patient    Methods Explanation;Demonstration;Handout    Comprehension Verbalized understanding;Returned demonstration               PT Long Term Goals - 05/21/20 4854      PT LONG TERM GOAL #1   Title Pt will return to baseline ROM measurements and demonstrate no signs of lymphedema to allow pt to return to PLOF.    Status Achieved      PT LONG TERM GOAL #2   Title Pt will be able to raise her left arm up high enough to brush her hair.    Baseline Pt able to reach top of head, but not back of neck-05/21/20    Status Partially Met      PT LONG TERM GOAL #3   Title Pt will be independent with self MLD for long term management of lymphedema of anterior neck.    Baseline Pt reports being independent with this-05/21/20    Status Achieved      PT LONG TERM GOAL #4   Title Pt will be independent in a home exercise program for long term stretching and strengthening.    Baseline Pt independent with current HEP for cervical and postural stretching, along with Meeks Decompression, added isometric strengthening today-05/21/20    Status Achieved      PT LONG TERM GOAL #5   Title Pt will obtain appropriate compression garments for long term management of lymphedema.    Baseline Pt did not get compression garment as she had good results with self MLD-05/21/20    Status Deferred                 Plan - 05/21/20 1237    Clinical Impression Statement Pt returns to physical therapy for final assess after radiation. She reports radiation ended early due to her being hospitalized over Christmas. Now she reports doing well overall. Her lymphedema is improved as she has been compliant with self MLD, her Lt upper trap tightness is much improved as she is able to have better posture due to decresaed pain now as radiation is over. She reports her t shoulder being only issue now but feels this may not get much better as her shoulder joint was deteriorated  before  being diagnosed with cancer. Her A/ROM had improved though and pt was encouraged by this. Issued isometric shoulder strngthening and encouraged pt to continue AA/ROM dowel exercises to promote as much increased strength and motion as able. Pt verbalized uderstanding all and feels she is ready for D/C at this time.    Examination-Activity Limitations Reach Overhead;Lift;Dressing    Examination-Participation Restrictions Cleaning;Community Activity    Stability/Clinical Decision Making Stable/Uncomplicated    Rehab Potential Excellent    PT Frequency 2x / week    PT Duration 6 weeks    PT Treatment/Interventions ADLs/Self Care Home Management;Patient/family education;Therapeutic exercise;Manual lymph drainage;Compression bandaging;Manual techniques    PT Next Visit Plan D/C this visit.    PT Home Exercise Plan head and neck ROM exercises, [id number] for shoulder; self MLD, Meeks Decompression Exs and TMJ A/ROM; Lt shoulder isometric strength    Consulted and Agree with Plan of Care Patient           Patient will benefit from skilled therapeutic intervention in order to improve the following deficits and impairments:  Pain,Postural dysfunction,Impaired UE functional use,Increased fascial restricitons,Decreased strength,Decreased range of motion,Increased edema  Visit Diagnosis: Lymphedema, not elsewhere classified  Abnormal posture  Stiffness of left shoulder, not elsewhere classified  Squamous cell carcinoma of tongue ([place])     Problem List Patient Active Problem List   Diagnosis Date Noted  . Intractable nausea and vomiting 04/19/2020  . Tongue cancer ([place]) 02/16/2020  . Hyperlipidemia 12/14/2017  . Abnormal EKG 12/14/2017  . Thyroid nodule 12/14/2017  . A-fib ([place])   . AF (paroxysmal atrial fibrillation) ([place]) 10/15/2017  . Leukopenia 10/12/2017  . Thrombocytopenia ([place]) 10/12/2017  . Elevated transaminase level 10/12/2017  . Acute hyponatremia 10/12/2017  . Sepsis  secondary to UTI ([place]) 10/12/2017  . Hypertension 10/12/2017  . Positive D dimer 10/12/2017  . Recurrent UTI 10/12/2017  . GERD (gastroesophageal reflux disease) 10/12/2017  . Arthritis 10/12/2017  . Tick bites 10/12/2017  . Status post total right knee replacement 03/31/2015  . Chronic cough 11/03/2012    Otelia Limes, PTA 05/21/2020, 12:50 PM  [facility] [place], [place], [address] Phone: [phone]   Fax:  [phone]  Name: Erika [name] MRN: [id number] Date of Birth: 1946/03/08  PHYSICAL THERAPY DISCHARGE SUMMARY  Visits from Start of Care: 5  Current functional level related to goals / functional outcomes: See above   Remaining deficits: See above   Education / Equipment: HEP for neck and self MLD Plan: Patient agrees to discharge.  Patient goals were partially met. Patient is being discharged due to being pleased with the current functional level.  ?????    Shan Levans, PT

## 2020-05-30 ENCOUNTER — Ambulatory Visit: Payer: Medicare Other | Attending: Radiation Oncology

## 2020-05-30 ENCOUNTER — Other Ambulatory Visit: Payer: Self-pay

## 2020-05-30 DIAGNOSIS — R1311 Dysphagia, oral phase: Secondary | ICD-10-CM | POA: Diagnosis not present

## 2020-06-03 NOTE — Therapy (Signed)
[facility] [address] [place], [place], [address] Phone: [phone]   Fax:  [phone]  Speech Language Pathology Treatment  Patient Details  Name: Erika [name] MRN: [id number] Date of Birth: 1945/06/12 Referring Provider (SLP): Eppie Gibson, MD   Encounter Date: 05/30/2020   End of Session - 06/03/20 0927    Visit Number 3    Number of Visits 7    Date for SLP Re-Evaluation 06/26/20    SLP Start Time 1450    SLP Stop Time  1522    SLP Time Calculation (min) 32 min    Activity Tolerance Patient tolerated treatment well           Past Medical History:  Diagnosis Date  . A-fib ([place])   . Adrenal nodule ([place])   . Anxiety   . Arthritis   . Atrophic vaginitis   . Chronic hyponatremia   . Dysrhythmia   . Female climacteric state   . GERD (gastroesophageal reflux disease)   . Headache   . Hypercholesteremia   . Hyperlipidemia   . Hypertension   . Leukopenia   . PONV (postoperative nausea and vomiting)   . Recurrent UTI   . Right thyroid nodule   . Thrombocytopenia ([place])   . Tongue ulcer     Past Surgical History:  Procedure Laterality Date  . ESOPHAGOGASTRODUODENOSCOPY    . EXCISION OF TONGUE LESION N/A 02/16/2020   Procedure: EXCISION OF TONGUE LESION Partial Glossectomy;  Surgeon: [name], MD;  Location: [facility];  Service: ENT;  Laterality: N/A;  . HERNIA REPAIR     x2  groin  . JOINT REPLACEMENT    . PARTIAL GLOSSECTOMY  02/16/2020   EXCISION OF TONGUE LESION Partial Glossectomy (N/A Mouth)  . RADICAL NECK DISSECTION Left 02/16/2020   Procedure: RADICAL NECK DISSECTION Partial Left Neck Dissection;  Surgeon: [name], MD;  Location: [facility];  Service: ENT;  Laterality: Left;  . REPLACEMENT TOTAL KNEE     right knee  . SKIN CANCER EXCISION  05/2017   Excised from calf area  . TONSILLECTOMY    . TUBAL LIGATION      There were no vitals filed for this visit.   Subjective Assessment  - 06/03/20 0920    Subjective "The swallowing, the taste of food is better. It seems like my tongue moves a little better."                 ADULT SLP TREATMENT - 06/03/20 0001      General Information   Behavior/Cognition Alert;Cooperative;Pleasant mood      Treatment Provided   Treatment provided Dysphagia      Dysphagia Treatment   Temperature Spikes Noted No    Oral Cavity - Dentition Adequate natural dentition    Treatment Methods Skilled observation;Compensation strategy training;Patient/caregiver education    Patient observed directly with PO's Yes    Type of PO's observed Dysphagia 3 (soft);Thin liquids    Oral Phase Signs & Symptoms --   needs to keep bolus contained on rt - does so without diffculty today   Pharyngeal Phase Signs & Symptoms --   none noted   Other treatment/comments Pt is no longer using the patches for her saliva. Chicken and dumplings last night and Special K bar today. Pt still has to place food on the right and keep it contained there but no overt s/sx aspiration with POs today. Pt was modified independent with HEP.Agrees next session  in 8 weeks is appropriate. She told SLP rationale with mod I.      Assessment / Recommendations / Plan   Plan --   once every other month     Dysphagia Recommendations   Diet recommendations --   as tolerated   Liquids provided via Cup    Medication Administration --   as tolerated     Progression Toward Goals   Progression toward goals Progressing toward goals              SLP Short Term Goals - 06/03/20 4037      SLP SHORT TERM GOAL #1   Title pt will complete HEP with rare min A    Time 1    Period --   session/s, for all STGs   Status Partially Met   occasional min A     SLP SHORT TERM GOAL #2   Title pt will tell SLP why pt is completing HEP with modified independence    Status Partially Met      SLP SHORT TERM GOAL #3   Title pt will tell SLP how a food journal could hasten return to a more  normalized diet    Status Achieved      SLP SHORT TERM GOAL #4   Title pt will describe 3 overt s/s aspiration PNA with modified independence    Status Deferred            SLP Long Term Goals - 06/03/20 0929      SLP LONG TERM GOAL #1   Title pt will complete HEP with modified independence over two visits    Baseline 05-30-20    Time 2    Period --   sessions, for all LTGs   Status On-going      SLP LONG TERM GOAL #2   Title pt will describe how to modify HEP over time, and the timeline associated with reduction in HEP frequency with modified independence over two sessions    Time 4    Status On-going      SLP LONG TERM GOAL #3   Title pt will tell SLP 3 overt s/sx aspiration PNA with modified independence    Time 2    Status New            Plan - 06/03/20 5436    Clinical Impression Statement At this time pt swallowing is deemed WNL with dys III and thin liquids with ches/swallow on rt.  SLP reviewed Shaeley's HEP for dysphagia and pt completed each exercise on their own -modified independence. There are no overt s/s aspiration reported by pt at this time. Data indicate that pt's swallow ability will likely decrease over the course of radiation therapy and could very well decline over time following conclusion of their radiation therapy due to muscle disuse atrophy and/or muscle fibrosis. Pt will cont to need to be seen by SLP in order to assess safety of PO intake, assess the need for recommending any objective swallow assessment, and ensuring pt correctly completes the individualized HEP.    Speech Therapy Frequency --   once every approx 8 weeks   Duration --   7 total sessions   Treatment/Interventions Aspiration precaution training;Pharyngeal strengthening exercises;Diet toleration management by SLP;Trials of upgraded texture/liquids;Patient/family education;Compensatory strategies;SLP instruction and feedback    Potential to Achieve Goals Good    SLP Home Exercise Plan  provided    Consulted and Agree with Plan of Care Patient  Patient will benefit from skilled therapeutic intervention in order to improve the following deficits and impairments:   Dysphagia, oral phase    Problem List Patient Active Problem List   Diagnosis Date Noted  . Intractable nausea and vomiting 04/19/2020  . Tongue cancer ([place]) 02/16/2020  . Hyperlipidemia 12/14/2017  . Abnormal EKG 12/14/2017  . Thyroid nodule 12/14/2017  . A-fib ([place])   . AF (paroxysmal atrial fibrillation) ([place]) 10/15/2017  . Leukopenia 10/12/2017  . Thrombocytopenia ([place]) 10/12/2017  . Elevated transaminase level 10/12/2017  . Acute hyponatremia 10/12/2017  . Sepsis secondary to UTI ([place]) 10/12/2017  . Hypertension 10/12/2017  . Positive D dimer 10/12/2017  . Recurrent UTI 10/12/2017  . GERD (gastroesophageal reflux disease) 10/12/2017  . Arthritis 10/12/2017  . Tick bites 10/12/2017  . Status post total right knee replacement 03/31/2015  . Chronic cough 11/03/2012    Danila Eddie ,MS, CCC-SLP  06/03/2020, 9:30 AM  [facility] [address] [place], [place], [address] Phone: [phone]   Fax:  [phone]   Name: BIBI ECONOMOS MRN: [id number] Date of Birth: Nov 19, 1945

## 2020-06-04 ENCOUNTER — Telehealth: Payer: Self-pay | Admitting: Nutrition

## 2020-06-04 NOTE — Telephone Encounter (Signed)
Phone follow-up completed with patient status post treatment for tongue cancer.  Patient does not have a feeding tube.  Patient reports weight is stable overall..  Weight was documented at 132 pounds. She states she is eating a lot better and tolerating some solid food as long as she eats on the right side of her mouth.  She is drinking boost.  She has a distaste for sweetened foods.  Nutrition diagnosis: Food and nutrition related knowledge deficit resolved.  Patient educated to continue strategies for increasing calories and protein in foods and liquids to promote weight gain/weight stabilization.  Encourage patient to contact RD with any questions or concerns.  No follow-up has been scheduled patient has contact information for him questions or concerns.  **Disclaimer: This note was dictated with voice recognition software. Similar sounding words can inadvertently be transcribed and this note may contain transcription errors which may not have been corrected upon publication of note.**

## 2020-06-11 ENCOUNTER — Ambulatory Visit: Payer: Medicare Other

## 2020-06-25 DIAGNOSIS — C029 Malignant neoplasm of tongue, unspecified: Secondary | ICD-10-CM | POA: Diagnosis not present

## 2020-06-27 ENCOUNTER — Other Ambulatory Visit: Payer: Self-pay

## 2020-06-27 DIAGNOSIS — C029 Malignant neoplasm of tongue, unspecified: Secondary | ICD-10-CM

## 2020-07-01 NOTE — Progress Notes (Signed)
  Patient Name: Erika [name] MRN: [id number] DOB: Nov 19, 1945 Referring Physician: [name] (Profile Not Attached) Date of Service: 04/17/20 [facility], [place]                                                        End Of Treatment Note  Diagnoses: C02.1-Malignant neoplasm of border of tongue  Cancer Staging:  Cancer Staging Tongue cancer ([place]) Staging form: Oral Cavity, AJCC 8th Edition - Pathologic: Stage III (pT3, pN0, cM0) - Signed by Eppie Gibson, MD on 03/15/2020  Intent: Curative  Radiation Treatment Dates: 04/01/2020 through 04/17/2020 Site Technique Total Dose (Gy) Dose per Fx (Gy) Completed Fx Beam Energies  Tongue: HN_L_Tongu IMRT 32.5/50 2.5 13/20 6X   Narrative: The patient tolerated radiation therapy initially but then started to struggle with nausea and PO intake.  She decided to stop treatment prematurely as she did not want to sustain additional side effects.  Plan: The patient will follow-up with otolaryngology and see rad/onc in 3 months with restaging scans. Katelin RN will call her in the next couple weeks to make sure she is recovering satisfactorily and we will see her sooner if needed.  Addendum: BMP ordered for early January to ensure satisfactory kidney function, electrolytes.    Eppie Gibson, MD

## 2020-07-02 ENCOUNTER — Telehealth: Payer: Self-pay | Admitting: *Deleted

## 2020-07-02 NOTE — Telephone Encounter (Signed)
Called patient to inform of [facility] on 07-09-20 @ 1:30 pm @ [facility], and his CT on 07-09-20 - arrival time- 2:15 pm @ [facility], patient to have water only - 4 hrs. prior to test, patient to receive results from Dr. Isidore Moos on 07-12-20 @ 10:20 am for results, lvm for a return call

## 2020-07-04 ENCOUNTER — Telehealth: Payer: Self-pay | Admitting: *Deleted

## 2020-07-04 NOTE — Telephone Encounter (Signed)
Returned patient's phone call, spoke with patient 

## 2020-07-09 ENCOUNTER — Ambulatory Visit
Admission: RE | Admit: 2020-07-09 | Discharge: 2020-07-09 | Disposition: A | Discharge status: Home / Self Care | Payer: Medicare Other | Source: Ambulatory Visit | Attending: Radiation Oncology | Admitting: Radiation Oncology

## 2020-07-09 ENCOUNTER — Encounter (HOSPITAL_COMMUNITY): Payer: Self-pay

## 2020-07-09 ENCOUNTER — Ambulatory Visit (HOSPITAL_COMMUNITY)
Admission: RE | Admit: 2020-07-09 | Discharge: 2020-07-09 | Disposition: A | Discharge status: Home / Self Care | Payer: Medicare Other | Source: Ambulatory Visit | Attending: Radiation Oncology | Admitting: Radiation Oncology

## 2020-07-09 ENCOUNTER — Other Ambulatory Visit: Payer: Self-pay

## 2020-07-09 DIAGNOSIS — I700 Atherosclerosis of aorta: Secondary | ICD-10-CM | POA: Diagnosis not present

## 2020-07-09 DIAGNOSIS — C029 Malignant neoplasm of tongue, unspecified: Secondary | ICD-10-CM | POA: Insufficient documentation

## 2020-07-09 DIAGNOSIS — R918 Other nonspecific abnormal finding of lung field: Secondary | ICD-10-CM | POA: Diagnosis not present

## 2020-07-09 DIAGNOSIS — I2510 Atherosclerotic heart disease of native coronary artery without angina pectoris: Secondary | ICD-10-CM | POA: Diagnosis not present

## 2020-07-09 DIAGNOSIS — C760 Malignant neoplasm of head, face and neck: Secondary | ICD-10-CM | POA: Diagnosis not present

## 2020-07-09 LAB — BUN & CREATININE (CHCC)
BUN: 19 mg/dL (ref 8–23)
Creatinine: 0.71 mg/dL (ref 0.44–1.00)
GFR, Estimated: >60 mL/min (ref >60)

## 2020-07-09 MED ORDER — IOHEXOL 300 MG/ML  SOLN
75.0000 mL | Freq: Once | INTRAMUSCULAR | Status: AC | PRN
  Administered 2020-07-09: 75 mL via INTRAVENOUS

## 2020-07-12 ENCOUNTER — Other Ambulatory Visit: Payer: Self-pay

## 2020-07-12 ENCOUNTER — Ambulatory Visit
Admission: RE | Admit: 2020-07-12 | Discharge: 2020-07-12 | Disposition: A | Discharge status: Home / Self Care | Payer: Medicare Other | Source: Ambulatory Visit | Attending: Radiation Oncology | Admitting: Radiation Oncology

## 2020-07-12 ENCOUNTER — Encounter: Payer: Self-pay | Admitting: Radiation Oncology

## 2020-07-12 VITALS — BP 134/83 | HR 60 | Temp 97.0°F | Resp 18 | Wt 134.8 lb

## 2020-07-12 DIAGNOSIS — C029 Malignant neoplasm of tongue, unspecified: Secondary | ICD-10-CM

## 2020-07-12 DIAGNOSIS — H9202 Otalgia, left ear: Secondary | ICD-10-CM | POA: Diagnosis not present

## 2020-07-12 DIAGNOSIS — I700 Atherosclerosis of aorta: Secondary | ICD-10-CM | POA: Insufficient documentation

## 2020-07-12 DIAGNOSIS — M542 Cervicalgia: Secondary | ICD-10-CM | POA: Diagnosis not present

## 2020-07-12 DIAGNOSIS — R432 Parageusia: Secondary | ICD-10-CM | POA: Diagnosis not present

## 2020-07-12 DIAGNOSIS — Z85810 Personal history of malignant neoplasm of tongue: Secondary | ICD-10-CM | POA: Diagnosis not present

## 2020-07-12 DIAGNOSIS — Z7982 Long term (current) use of aspirin: Secondary | ICD-10-CM | POA: Insufficient documentation

## 2020-07-12 DIAGNOSIS — G8918 Other acute postprocedural pain: Secondary | ICD-10-CM | POA: Diagnosis not present

## 2020-07-12 DIAGNOSIS — C021 Malignant neoplasm of border of tongue: Secondary | ICD-10-CM

## 2020-07-12 DIAGNOSIS — Z79899 Other long term (current) drug therapy: Secondary | ICD-10-CM | POA: Insufficient documentation

## 2020-07-12 DIAGNOSIS — R918 Other nonspecific abnormal finding of lung field: Secondary | ICD-10-CM | POA: Insufficient documentation

## 2020-07-12 NOTE — Progress Notes (Signed)
Erika [name] presents today for follow-up after ending radiation treatment early to the left side of her tongue on 04/17/2020, and to review CT scan results from 07/09/2020  Pain issues, if any: Continues to deal with pain behind left ear (mainly with palpation). Reports occasional discomfort under left side of jaw and down left side of neck (reports on-going stiffness, but states she's diligent about doing her PT exercises). Manages with OTC Tylenol, but states she doesn't have to use every day Using a feeding tube?: N/A Weight changes, if any:  Wt Readings from Last 3 Encounters:  07/12/20 134 lb 12.8 oz (61.1 kg)  04/19/20 140 lb (63.5 kg)  04/18/20 140 lb (63.5 kg)   Swallowing issues, if any: Yes. Has to avoid drier foods (meats and breads). She still has to push food to the right side of her mouth to swallow, and often has to have a drink to ensure food goes down completely. Saw Erika [name] on 05/30/2020: "At this time pt swallowing is deemed WNL with dys III and thin liquids with ches/swallow on rt.  SLP reviewed Erika [name]'s HEP for dysphagia and pt completed each exercise on their own -modified independence. There are no overt s/s aspiration reported by pt at this time. Pt is no longer using the patches for her saliva. Chicken and dumplings last night and Special K bar today. Pt still has to place food on the right and keep it contained there but no overt s/sx aspiration with POs today. Pt was modified independent with HEP.Agrees next session in 8 weeks is appropriate" Smoking or chewing tobacco? None Using fluoride trays daily? N/A--sees her community dentist regularly Last ENT visit was on: 06/25/2020 Saw Dr. [name]:  --Physical Exam:  Healthy-appearing lady in no distress. Breathing is clear. Voice is normal but she has a very slight dysarthria from the tongue defect. Oral cavity and pharynx reveals healthy and clear mucous membranes with clear saliva and no masses visible or palpable.  The mandibular tori are still present. Indirect exam of the pharynx is clear. No palpable cervical adenopathy. --Impression & Plans:  Stable, no evidence of recurrent disease. Recheck 3 months or sooner as needed.  Other notable issues, if any: Denies any difficulty opening her mouth fully. Reports occasional dry mouth/thick saliva. States she has occasional swelling/lymphedema to the left side under her jaw/down her neck (reports she is diligent about massaging area as instructed by PT). Denies any difficulty sleeping or residual fatigue. Nausea and difficulty swallowing (which cause patient to end treatment early) have resolved. Overall she reports she is doing well, and hopeful she will continue to improve/progess positively   Vitals:   07/12/20 1027  BP: 134/83  Pulse: 60  Resp: 18  Temp: (!) 97 F (36.1 C)  SpO2: 99%

## 2020-07-12 NOTE — Progress Notes (Signed)
Oncology Nurse Navigator Documentation  I met with Erika [name] and her friend before and during her follow up appointment with Dr. Isidore Moos today. She reports that she is improving after completing radiation in December. She continues to report taste changes but continues to try different foods. She presented for results of her recent CT scans which were provided by Dr. Isidore Moos. She continues to see Dr. Constance Holster every 3 months and will see Dr. Isidore Moos in one year to receive results of a CT chest completed before the appointment. She knows to call me if she has any further questions or concerns.   Harlow Asa RN, BSN, OCN Head & Neck Oncology Nurse [place] at [facility] Phone # [phone]  Fax # [phone]

## 2020-07-12 NOTE — Progress Notes (Signed)
Radiation Oncology         [phone] ________________________________  Name: Erika [name] MRN: [id number]  Date: 07/12/2020  DOB: July 05, 1945  Follow-Up Visit Note  CC: [name], MD  [name], MD  Diagnosis and Prior Radiotherapy:     C02.1   ICD-10-CM   1. Tongue cancer ([place])  C02.9   2. Malignant neoplasm of border of tongue ([facility])  C02.1     Cancer Staging:  Cancer Staging Tongue cancer ([place]) Staging form: Oral Cavity, AJCC 8th Edition - Pathologic: Stage III (pT3, pN0, cM0) - Signed by Eppie Gibson, MD on 03/15/2020  Intent: Curative  Radiation Treatment Dates: 04/01/2020 through 04/17/2020 Site Technique Total Dose (Gy) Dose per Fx (Gy) Completed Fx Beam Energies  Tongue: HN_L_Tongu IMRT 32.5/50 2.5 13/20 6X   CHIEF COMPLAINT:  Here for follow-up and surveillance of tongue cancer  Narrative:  The patient returns today for routine follow-up.  She is doing well overall.  She reports some postoperative discomfort and stiffness in her left neck and occasional pain behind her left ear.  She occasionally takes Tylenol for this.  Her taste changes are starting to resolve.  She has lost about 6 pounds over the past 3 months.  She continues to follow the instructions of speech-language pathology.     She sees her community dentist regularly.  Denies tobacco abuse.  She saw Dr. Constance Holster earlier this month.  No evidence of disease.        ALLERGIES:  is allergic to hydrochlorothiazide, atorvastatin, codeine, and strawberry flavor.  Meds: Current Outpatient Medications  Medication Sig Dispense Refill  . acetaminophen (TYLENOL) 500 MG tablet Take 1,000 mg by mouth every 6 (six) hours as needed for moderate pain or headache.    Marland Kitchen amLODipine (NORVASC) 5 MG tablet Take 1 tablet (5 mg total) by mouth daily. Please make overdue appt with Dr. Tamala Julian before anymore refills. 3rd and Final Attempt (Patient taking differently: Take 5 mg by mouth at bedtime. Please make overdue appt with  Dr. Tamala Julian before anymore refills. 3rd and Final Attempt) 15 tablet 0  . aspirin EC 81 MG tablet Take 81 mg by mouth daily.    . Calcium Carb-Cholecalciferol (CALCIUM 600 + D PO) Take 1 tablet by mouth daily.    . Carboxymethylcellulose Sodium (ARTIFICIAL TEARS OP) Place 1 drop into both eyes daily as needed (dry eyes).    . cetirizine (ZYRTEC) 10 MG tablet Take 10 mg by mouth daily.    . diclofenac Sodium (VOLTAREN) 1 % GEL Apply 1 application topically 4 (four) times daily as needed (pain).    Marland Kitchen esomeprazole (NEXIUM) 40 MG capsule Take 40 mg by mouth daily.  5  . famotidine (PEPCID) 20 MG tablet Take 20 mg by mouth at bedtime.     . fluticasone (FLONASE) 50 MCG/ACT nasal spray INSTILL 2 SPRAYS IN EACH NOSTRIL EVERY DAY. (Patient taking differently: Place 2 sprays into both nostrils daily.) 16 g 3  . lidocaine (XYLOCAINE) 2 % solution Patient: Mix 1part 2% viscous lidocaine, 1part H20. Swish & swallow 2mL of diluted mixture, 94min before meals and at bedtime, up to 5 times daily. (Patient taking differently: Use as directed 15 mLs in the mouth or throat See admin instructions. Patient: Mix 1part 2% viscous lidocaine, 1part H20. Swish & swallow 9mL of diluted mixture, 30min before meals and at bedtime, up to 5 times daily.) 200 mL 5  . losartan (COZAAR) 100 MG tablet Take 1 tablet (100 mg total)  by mouth daily. Please make overdue appt with Dr. Tamala Julian before anymore refills. 2nd attempt 15 tablet 0  . metoCLOPramide (REGLAN) 10 MG tablet Take 1 tablet (10 mg total) by mouth every 8 (eight) hours as needed for nausea, vomiting or refractory nausea / vomiting. 90 tablet 0  . metoprolol tartrate (LOPRESSOR) 25 MG tablet Take 25 mg by mouth 2 (two) times daily.     . Multiple Vitamin (MULTIVITAMIN WITH MINERALS) TABS tablet Take 1 tablet by mouth daily.    . nitrofurantoin (MACRODANTIN) 50 MG capsule Take 50 mg by mouth at bedtime.     . ondansetron (ZOFRAN ODT) 4 MG disintegrating tablet Take 1 tablet (4  mg total) by mouth every 8 (eight) hours as needed for nausea or vomiting. (Patient not taking: Reported on 07/12/2020) 20 tablet 0  . Probiotic Product (RA PROBIOTIC GUMMIES) CHEW Chew 2 capsules by mouth daily.    Marland Kitchen scopolamine (TRANSDERM-SCOP) 1 MG/3DAYS Place 1 patch (1.5 mg total) onto the skin every 3 (three) days. (Patient not taking: Reported on 07/12/2020) 10 patch 0  . simvastatin (ZOCOR) 20 MG tablet Take 20 mg by mouth at bedtime.     . trolamine salicylate (ASPERCREME) 10 % cream Apply 1 application topically as needed for muscle pain.    . vitamin B-12 (CYANOCOBALAMIN) 100 MCG tablet Take 100 mcg by mouth daily.     No current facility-administered medications for this encounter.    Physical Findings: The patient is in no acute distress. Patient is alert and oriented. Wt Readings from Last 3 Encounters:  07/12/20 134 lb 12.8 oz (61.1 kg)  04/19/20 140 lb (63.5 kg)  04/18/20 140 lb (63.5 kg)    weight is 134 lb 12.8 oz (61.1 kg). Her tympanic temperature is 97 F (36.1 C) (abnormal). Her blood pressure is 134/83 and her pulse is 60. Her respiration is 18 and oxygen saturation is 99%. .  General: Alert and oriented, in no acute distress HEENT: Head is normocephalic. Extraocular movements are intact. Oropharynx is notable for no lesions in the upper throat or throughout the oral cavity Neck: Neck is notable for no palpable adenopathy in cervical or supraclavicular regions Skin: Skin in treatment fields shows satisfactory healing  Heart: Regular in rate and rhythm with no murmurs, rubs, or gallops. Chest: Clear to auscultation bilaterally, with no rhonchi, wheezes, or rales. Abdomen: Soft, nontender, nondistended, with no rigidity or guarding. Lymphatics: see Neck Exam Psychiatric: Judgment and insight are intact. Affect is appropriate.   Lab Findings: Lab Results  Component Value Date   WBC 5.1 04/20/2020   HGB 12.4 04/20/2020   HCT 35.8 (L) 04/20/2020   MCV 88.4  04/20/2020   PLT 308 04/20/2020    Lab Results  Component Value Date   TSH 0.925 03/19/2020    Radiographic Findings: CT Soft Tissue Neck W Contrast  Result Date: 07/09/2020 CLINICAL DATA:  Tongue cancer post surgery and radiation, follow-up EXAM: CT NECK WITH CONTRAST TECHNIQUE: Multidetector CT imaging of the neck was performed using the standard protocol following the bolus administration of intravenous contrast. CONTRAST:  70mL OMNIPAQUE IOHEXOL 300 MG/ML  SOLN COMPARISON:  01/19/2020 FINDINGS: Pharynx and larynx: Soft tissue thickening in the left suprahyoid neck likely related to treatment and nodal dissection. No evidence of local recurrence, noting that there is significant streak artifact through the oral cavity. Pharynx and larynx unremarkable. Salivary glands: Right submandibular gland and both parotid glands are unremarkable. Left submandibular gland is atrophic or resected. Thyroid: Unremarkable.  Lymph nodes: Evidence of left nodal dissection. No new enlarged or abnormal density nodes. Vascular: Major neck vessels are patent. Limited intracranial: No abnormal enhancement. Visualized orbits: Not included. Mastoids and visualized paranasal sinuses: No significant opacification. Skeleton: Degenerative changes of the cervical spine similar appearance to prior study degenerative changes of the temporomandibular joints. Upper chest: Dictated separately. Other: None. IMPRESSION: Baseline post treatments study. No evidence of recurrent disease. NI-RADS 1. Electronically Signed   By: Macy Mis M.D.   On: 07/09/2020 16:33   CT Chest W Contrast  Result Date: 07/10/2020 CLINICAL DATA:  Head neck cancer.  Surveillance. EXAM: CT CHEST WITH CONTRAST TECHNIQUE: Multidetector CT imaging of the chest was performed during intravenous contrast administration. CONTRAST:  97mL OMNIPAQUE IOHEXOL 300 MG/ML  SOLN COMPARISON:  PET-CT 01/22/2020. FINDINGS: Cardiovascular: The heart size is normal. No  substantial pericardial effusion. Atherosclerotic calcification is noted in the wall of the thoracic aorta. Mediastinum/Nodes: No mediastinal lymphadenopathy. There is no hilar lymphadenopathy. The esophagus has normal imaging features. There is no axillary lymphadenopathy. Lungs/Pleura: Stable 4 mm left upper lobe ground-glass nodule on image 40/series 7. 3 mm left upper lobe nodule identified previously is stable on image 44/7 today. No new suspicious pulmonary nodule or mass. No focal airspace consolidation. There is no evidence of pleural effusion. Upper Abdomen: Stable thickening of both adrenal glands without nodularity or mass lesion. Musculoskeletal: No worrisome lytic or sclerotic osseous abnormality. IMPRESSION: 1. Stable exam. No new or progressive findings to suggest metastatic disease in the chest. 2. Stable tiny left upper lobe pulmonary nodules. No follow-up needed if patient is low-risk. Non-contrast chest CT can be considered in 12 months if patient is high-risk. This recommendation follows the consensus statement: Guidelines for Management of Incidental Pulmonary Nodules Detected on CT Images: From the Fleischner Society 2017; Radiology 2017; 284:228-243. 3. Aortic Atherosclerosis (ICD10-I70.0). Electronically Signed   By: Misty Stanley M.D.   On: 07/10/2020 10:07    Impression/Plan:    1) Head and Neck Cancer Status: No evidence of disease.  She is in remission; we discussed the results of a CT scan of her neck and her chest.  Anderson Malta, our navigator, will order a CT of the chest without contrast in 12 months and I will see her a day thereafter to verify no evidence of future progression or recurrence.  She will continue to follow closely with otolaryngology to inspect her oral cavity and neck.  2) Nutritional Status: Continue to taste different flavors, I told her I expect her dysgeusia to continue to improve.  Slight weight loss noted PEG tube: None  3) Risk Factors: The patient has  been educated about risk factors including alcohol and tobacco abuse; they understand that avoidance of alcohol and tobacco is important to prevent recurrences as well as other cancers  4) Swallowing: Continue SLP exercises  5) Dental: Encouraged to continue regular followup with dentistry, and dental hygiene including fluoride rinses.   6) Thyroid function: Unlikely to be affected by radiation therapy given the dose that she received Lab Results  Component Value Date   TSH 0.925 03/19/2020    7)  Follow-up in 12 months with CT Chest, continue follow-up as above with otolaryngology. The patient was encouraged to call with any issues or questions before then.  On date of service, in total, I spent 25 minutes on this encounter. Patient was seen in person. _____________________________________   Eppie Gibson, MD

## 2020-07-20 DIAGNOSIS — R0781 Pleurodynia: Secondary | ICD-10-CM | POA: Diagnosis not present

## 2020-07-22 ENCOUNTER — Other Ambulatory Visit: Payer: Self-pay

## 2020-07-22 DIAGNOSIS — C021 Malignant neoplasm of border of tongue: Secondary | ICD-10-CM

## 2020-08-06 ENCOUNTER — Other Ambulatory Visit: Payer: Self-pay

## 2020-08-06 ENCOUNTER — Ambulatory Visit: Payer: Medicare Other | Attending: Radiation Oncology

## 2020-08-06 DIAGNOSIS — R1311 Dysphagia, oral phase: Secondary | ICD-10-CM

## 2020-08-06 NOTE — Therapy (Signed)
[facility] [address] [place], [place], [address] Phone: [phone]   Fax:  [phone]  Speech Language Pathology Treatment/Renewal-Discharge Summary  Patient Details  Name: Erika [name] MRN: [id number] Date of Birth: 1945-08-02 Referring Provider (SLP): Eppie Gibson, MD   Encounter Date: 08/06/2020   End of Session - 08/06/20 1058    Visit Number 4    Number of Visits 7    Date for SLP Re-Evaluation 08/06/20    SLP Start Time 69    SLP Stop Time  1050    SLP Time Calculation (min) 30 min    Activity Tolerance Patient tolerated treatment well           Past Medical History:  Diagnosis Date  . A-fib ([place])   . Adrenal nodule ([place])   . Anxiety   . Arthritis   . Atrophic vaginitis   . Chronic hyponatremia   . Dysrhythmia   . Female climacteric state   . GERD (gastroesophageal reflux disease)   . Headache   . Hypercholesteremia   . Hyperlipidemia   . Hypertension   . Leukopenia   . PONV (postoperative nausea and vomiting)   . Recurrent UTI   . Right thyroid nodule   . Thrombocytopenia ([place])   . Tongue ulcer     Past Surgical History:  Procedure Laterality Date  . ESOPHAGOGASTRODUODENOSCOPY    . EXCISION OF TONGUE LESION N/A 02/16/2020   Procedure: EXCISION OF TONGUE LESION Partial Glossectomy;  Surgeon: [name], MD;  Location: [facility];  Service: ENT;  Laterality: N/A;  . HERNIA REPAIR     x2  groin  . JOINT REPLACEMENT    . PARTIAL GLOSSECTOMY  02/16/2020   EXCISION OF TONGUE LESION Partial Glossectomy (N/A Mouth)  . RADICAL NECK DISSECTION Left 02/16/2020   Procedure: RADICAL NECK DISSECTION Partial Left Neck Dissection;  Surgeon: [name], MD;  Location: [facility];  Service: ENT;  Laterality: Left;  . REPLACEMENT TOTAL KNEE     right knee  . SKIN CANCER EXCISION  05/2017   Excised from calf area  . TONSILLECTOMY    . TUBAL LIGATION      There were no vitals filed for this visit.    SPEECH THERAPY RENEWAL-DISCHARGE SUMMARY  Visits from Start of Care: 4  Current functional level related to goals / functional outcomes: See goals below.   Remaining deficits: Pt requires to chew/swallow food on rt due to surgical changes to lingual musculature.   Education / Equipment: HEP procedure, overt s/sx aspiration PNA, late effects to swallowing after head/neck radiation.   Plan: Patient agrees to discharge.  Patient goals were not met. Patient is being discharged due to meeting the stated rehab goals.  ?????and pt satisfaction with progress.           Subjective Assessment - 08/06/20 1027    Subjective Pt is refraining from dry food items or bread/bready items.    Currently in Pain? No/denies                 ADULT SLP TREATMENT - 08/06/20 1027      General Information   Behavior/Cognition Alert;Cooperative;Pleasant mood      Treatment Provided   Treatment provided Dysphagia      Dysphagia Treatment   Temperature Spikes Noted No    Oral Cavity - Dentition Adequate natural dentition    Treatment Methods Skilled observation;Compensation strategy training;Patient/caregiver education    Patient observed directly with PO's Yes  Type of PO's observed Regular;Thin liquids    Oral Phase Signs & Symptoms Other (comment)   needs to keep everything contained on rt   Pharyngeal Phase Signs & Symptoms Other (comment)   no overt s/sx difficulty pharyngeally today   Other treatment/comments Pt was modified independent with HEP today - says she gets them in BID as much as she can to afford her best opportunity for maintaining WNL/WFL swallowing/safe swallowing. Pt is mod I wiht HEP and has WFL/WFNL swallowing and is appropriate for d/c. Pt agrees. She demo'd knowledge of how to modify the HEP as time progresses.      Assessment / Recommendations / Plan   Plan Discharge SLP treatment due to (comment)   pt satisfied with progress, safe with POs, and performs HEP  correctly     Dysphagia Recommendations   Diet recommendations --   as tolerated   Liquids provided via Cup    Medication Administration --   as tolerated     Progression Toward Goals   Progression toward goals Progressing toward goals            SLP Education - 08/06/20 1058    Education Details aspiration PNA s/sx    Person(s) Educated Patient    Methods Explanation;Handout    Comprehension Verbalized understanding            SLP Short Term Goals - 06/03/20 0928      SLP SHORT TERM GOAL #1   Title pt will complete HEP with rare min A    Time 1    Period --   session/s, for all STGs   Status Partially Met   occasional min A     SLP SHORT TERM GOAL #2   Title pt will tell SLP why pt is completing HEP with modified independence    Status Partially Met      SLP SHORT TERM GOAL #3   Title pt will tell SLP how a food journal could hasten return to a more normalized diet    Status Achieved      SLP SHORT TERM GOAL #4   Title pt will describe 3 overt s/s aspiration PNA with modified independence    Status Deferred            SLP Long Term Goals - 08/06/20 1042      SLP LONG TERM GOAL #1   Title pt will complete HEP with modified independence over two visits    Baseline 05-30-20    Period --   sessions, for all LTGs   Status Achieved      SLP LONG TERM GOAL #2   Title pt will describe how to modify HEP over time, and the timeline associated with reduction in HEP frequency with modified independence over two sessions    Baseline 08-06-20    Status Partially Met      SLP LONG TERM GOAL #3   Title pt will tell SLP 3 overt s/sx aspiration PNA with modified independence    Status Achieved            Plan - 08/06/20 1059    Clinical Impression Statement Goals to remain in-force for this session, then d/c. At this time pt swallowing continues as WFL/WNL with diet as tolerated (today, tested with regular (chewy granola bar) and thin liquids with chew/swallow on rt.   SLP reviewed Erika [name]'s HEP for dysphagia and pt completed each exercise on her own-modified independence. There are no overt s/s aspiration  PNA observed nor reported by pt at this time. Data indicate that pt's swallow ability will likely decrease over the course of radiation therapy and could very well decline over time following conclusion of their radiation therapy due to muscle disuse atrophy and/or muscle fibrosis. Pt agrees with d/c today.    Speech Therapy Frequency --   once every approx 8 weeks   Duration --   7 total sessions   Treatment/Interventions Aspiration precaution training;Pharyngeal strengthening exercises;Diet toleration management by SLP;Trials of upgraded texture/liquids;Patient/family education;Compensatory strategies;SLP instruction and feedback    Potential to Achieve Goals Good    SLP Home Exercise Plan provided    Consulted and Agree with Plan of Care Patient           Patient will benefit from skilled therapeutic intervention in order to improve the following deficits and impairments:   Dysphagia, oral phase    Problem List Patient Active Problem List   Diagnosis Date Noted  . Intractable nausea and vomiting 04/19/2020  . Squamous cell carcinoma of lateral tongue ([place]) 02/16/2020  . Hyperlipidemia 12/14/2017  . Abnormal EKG 12/14/2017  . Thyroid nodule 12/14/2017  . A-fib ([place])   . AF (paroxysmal atrial fibrillation) ([place]) 10/15/2017  . Leukopenia 10/12/2017  . Thrombocytopenia ([place]) 10/12/2017  . Elevated transaminase level 10/12/2017  . Acute hyponatremia 10/12/2017  . Sepsis secondary to UTI ([place]) 10/12/2017  . Hypertension 10/12/2017  . Positive D dimer 10/12/2017  . Recurrent UTI 10/12/2017  . GERD (gastroesophageal reflux disease) 10/12/2017  . Arthritis 10/12/2017  . Tick bites 10/12/2017  . Status post total right knee replacement 03/31/2015  . Chronic cough 11/03/2012    Mary-Anne Polizzi ,MS, CCC-SLP  08/06/2020, 11:03 AM  [facility] [address] [place], [place], [address] Phone: [phone]   Fax:  [phone]   Name: AVIS TIRONE MRN: [id number] Date of Birth: 1946/03/05

## 2020-08-06 NOTE — Patient Instructions (Signed)
Signs of Aspiration Pneumonia   . Chest pain/tightness . Fever (can be low grade) . Cough  o With foul-smelling phlegm (sputum) o With sputum containing pus or blood o With greenish sputum . Fatigue  . Shortness of breath  . Wheezing   **IF YOU HAVE THESE SIGNS, CONTACT YOUR DOCTOR OR GO TO THE EMERGENCY DEPARTMENT OR URGENT CARE AS SOON AS POSSIBLE**      

## 2020-08-08 DIAGNOSIS — Z23 Encounter for immunization: Secondary | ICD-10-CM | POA: Diagnosis not present

## 2020-08-16 ENCOUNTER — Other Ambulatory Visit: Payer: Self-pay | Admitting: Family Medicine

## 2020-08-16 DIAGNOSIS — E2839 Other primary ovarian failure: Secondary | ICD-10-CM

## 2020-09-06 DIAGNOSIS — C029 Malignant neoplasm of tongue, unspecified: Secondary | ICD-10-CM | POA: Diagnosis not present

## 2020-09-06 DIAGNOSIS — Z79899 Other long term (current) drug therapy: Secondary | ICD-10-CM | POA: Diagnosis not present

## 2020-09-06 DIAGNOSIS — K219 Gastro-esophageal reflux disease without esophagitis: Secondary | ICD-10-CM | POA: Diagnosis not present

## 2020-09-06 DIAGNOSIS — I10 Essential (primary) hypertension: Secondary | ICD-10-CM | POA: Diagnosis not present

## 2020-09-06 DIAGNOSIS — E7800 Pure hypercholesterolemia, unspecified: Secondary | ICD-10-CM | POA: Diagnosis not present

## 2020-09-06 DIAGNOSIS — N390 Urinary tract infection, site not specified: Secondary | ICD-10-CM | POA: Diagnosis not present

## 2020-09-26 DIAGNOSIS — C029 Malignant neoplasm of tongue, unspecified: Secondary | ICD-10-CM | POA: Diagnosis not present

## 2020-12-26 DIAGNOSIS — C029 Malignant neoplasm of tongue, unspecified: Secondary | ICD-10-CM | POA: Diagnosis not present

## 2021-02-03 ENCOUNTER — Ambulatory Visit (INDEPENDENT_AMBULATORY_CARE_PROVIDER_SITE_OTHER): Payer: Medicare Other | Admitting: Podiatry

## 2021-02-03 ENCOUNTER — Ambulatory Visit (INDEPENDENT_AMBULATORY_CARE_PROVIDER_SITE_OTHER): Payer: Medicare Other

## 2021-02-03 ENCOUNTER — Other Ambulatory Visit: Payer: Self-pay

## 2021-02-03 ENCOUNTER — Encounter: Payer: Self-pay | Admitting: Podiatry

## 2021-02-03 DIAGNOSIS — M2041 Other hammer toe(s) (acquired), right foot: Secondary | ICD-10-CM | POA: Diagnosis not present

## 2021-02-03 DIAGNOSIS — M21621 Bunionette of right foot: Secondary | ICD-10-CM | POA: Diagnosis not present

## 2021-02-03 NOTE — Progress Notes (Signed)
   HPI: 75 y.o. female presenting today for follow-up evaluation of symptomatic hammertoes and tailor's bunion to the right foot greater than the left.  Patient states that she is ready to schedule surgery.  Patient was scheduled for surgery earlier last year however she was diagnosed with a malignant lesion in her mouth that required surgery and removal of the lymph nodes.  She says that currently she is free from cancer.  There is been no change over the past year and no new complaints at this time.  Past Medical History:  Diagnosis Date   A-fib ([facility] [place])    Adrenal nodule ([place])    Anxiety    Arthritis    Atrophic vaginitis    Chronic hyponatremia    Dysrhythmia    Female climacteric state    GERD (gastroesophageal reflux disease)    Headache    Hypercholesteremia    Hyperlipidemia    Hypertension    Leukopenia    PONV (postoperative nausea and vomiting)    Recurrent UTI    Right thyroid nodule    Thrombocytopenia ([facility])    Tongue ulcer       Objective: Physical Exam General: The patient is alert and oriented x3 in no acute distress.  Dermatology: Skin is cool, dry and supple bilateral lower extremities. Negative for open lesions or macerations.  Vascular: Palpable pedal pulses bilaterally. No edema or erythema noted. Capillary refill within normal limits.  Neurological: Epicritic and protective threshold grossly intact bilaterally.   Musculoskeletal Exam: All pedal and ankle joints range of motion within normal limits bilateral. Muscle strength 5/5 in all groups bilateral. Hammertoe contracture deformity noted to digits 2, 3 and 4 of the right foot.  The second digit hammertoe contracture is actually overlapping the great toe. Clinical evidence of Tailor's bunion deformity noted to the respective foot. There is a moderate pain on palpation range of motion of the fifth MPJ.    Assessment: 1. Hammertoe contracture noted to digits 2, 3, 4 right 2. Tailor's bunion deformity right     Plan of Care:  1. Patient evaluated. 2. Today we discussed the conservative versus surgical management of the presenting pathology. The patient opts for surgical management. All possible complications and details of the procedure were explained. All patient questions were answered. No guarantees were expressed or implied. 3. Authorization for surgery was initiated today. Surgery will consist of hammertoe arthroplasty with MTPJ capsulotomy digits 2-4 right.  Tailor's bunionectomy with osteotomy right.  Possible Weil shortening osteotomy second metatarsal right 4.  Medical clearance from PCP prior to surgery  5.  Return to clinic 1 week postop  *[place] of time at the beach and her son's saltwater pool.     Edrick Kins, DPM [facility]  Dr. Edrick Kins, DPM    [address], [place] [address]                Office [phone]  Fax [phone]

## 2021-02-06 DIAGNOSIS — H524 Presbyopia: Secondary | ICD-10-CM | POA: Diagnosis not present

## 2021-02-06 DIAGNOSIS — H2513 Age-related nuclear cataract, bilateral: Secondary | ICD-10-CM | POA: Diagnosis not present

## 2021-02-06 DIAGNOSIS — H353132 Nonexudative age-related macular degeneration, bilateral, intermediate dry stage: Secondary | ICD-10-CM | POA: Diagnosis not present

## 2021-02-06 DIAGNOSIS — H25013 Cortical age-related cataract, bilateral: Secondary | ICD-10-CM | POA: Diagnosis not present

## 2021-02-07 DIAGNOSIS — K219 Gastro-esophageal reflux disease without esophagitis: Secondary | ICD-10-CM | POA: Diagnosis not present

## 2021-02-07 DIAGNOSIS — I4891 Unspecified atrial fibrillation: Secondary | ICD-10-CM | POA: Diagnosis not present

## 2021-02-07 DIAGNOSIS — E7800 Pure hypercholesterolemia, unspecified: Secondary | ICD-10-CM | POA: Diagnosis not present

## 2021-02-07 DIAGNOSIS — I10 Essential (primary) hypertension: Secondary | ICD-10-CM | POA: Diagnosis not present

## 2021-02-07 DIAGNOSIS — Z23 Encounter for immunization: Secondary | ICD-10-CM | POA: Diagnosis not present

## 2021-02-07 DIAGNOSIS — E785 Hyperlipidemia, unspecified: Secondary | ICD-10-CM | POA: Diagnosis not present

## 2021-02-25 DIAGNOSIS — I10 Essential (primary) hypertension: Secondary | ICD-10-CM | POA: Diagnosis not present

## 2021-02-25 DIAGNOSIS — E7800 Pure hypercholesterolemia, unspecified: Secondary | ICD-10-CM | POA: Diagnosis not present

## 2021-02-25 DIAGNOSIS — Z01818 Encounter for other preprocedural examination: Secondary | ICD-10-CM | POA: Diagnosis not present

## 2021-02-25 DIAGNOSIS — Z23 Encounter for immunization: Secondary | ICD-10-CM | POA: Diagnosis not present

## 2021-02-25 DIAGNOSIS — Z79899 Other long term (current) drug therapy: Secondary | ICD-10-CM | POA: Diagnosis not present

## 2021-02-25 DIAGNOSIS — Z0001 Encounter for general adult medical examination with abnormal findings: Secondary | ICD-10-CM | POA: Diagnosis not present

## 2021-02-26 ENCOUNTER — Ambulatory Visit
Admission: RE | Admit: 2021-02-26 | Discharge: 2021-02-26 | Disposition: A | Discharge status: Home / Self Care | Payer: Medicare Other | Source: Ambulatory Visit | Attending: Family Medicine | Admitting: Family Medicine

## 2021-02-26 ENCOUNTER — Other Ambulatory Visit: Payer: Self-pay

## 2021-02-26 DIAGNOSIS — M810 Age-related osteoporosis without current pathological fracture: Secondary | ICD-10-CM | POA: Diagnosis not present

## 2021-02-26 DIAGNOSIS — E2839 Other primary ovarian failure: Secondary | ICD-10-CM

## 2021-02-26 DIAGNOSIS — M85852 Other specified disorders of bone density and structure, left thigh: Secondary | ICD-10-CM | POA: Diagnosis not present

## 2021-03-06 DIAGNOSIS — M810 Age-related osteoporosis without current pathological fracture: Secondary | ICD-10-CM | POA: Diagnosis not present

## 2021-03-11 DIAGNOSIS — H2511 Age-related nuclear cataract, right eye: Secondary | ICD-10-CM | POA: Diagnosis not present

## 2021-03-11 DIAGNOSIS — H25811 Combined forms of age-related cataract, right eye: Secondary | ICD-10-CM | POA: Diagnosis not present

## 2021-03-11 DIAGNOSIS — H25011 Cortical age-related cataract, right eye: Secondary | ICD-10-CM | POA: Diagnosis not present

## 2021-03-27 DIAGNOSIS — M810 Age-related osteoporosis without current pathological fracture: Secondary | ICD-10-CM | POA: Diagnosis not present

## 2021-04-01 DIAGNOSIS — M270 Developmental disorders of jaws: Secondary | ICD-10-CM | POA: Diagnosis not present

## 2021-04-01 DIAGNOSIS — C029 Malignant neoplasm of tongue, unspecified: Secondary | ICD-10-CM | POA: Diagnosis not present

## 2021-04-15 DIAGNOSIS — E785 Hyperlipidemia, unspecified: Secondary | ICD-10-CM | POA: Diagnosis not present

## 2021-04-15 DIAGNOSIS — K219 Gastro-esophageal reflux disease without esophagitis: Secondary | ICD-10-CM | POA: Diagnosis not present

## 2021-04-15 DIAGNOSIS — I4891 Unspecified atrial fibrillation: Secondary | ICD-10-CM | POA: Diagnosis not present

## 2021-04-15 DIAGNOSIS — I10 Essential (primary) hypertension: Secondary | ICD-10-CM | POA: Diagnosis not present

## 2021-04-15 DIAGNOSIS — M810 Age-related osteoporosis without current pathological fracture: Secondary | ICD-10-CM | POA: Diagnosis not present

## 2021-04-15 DIAGNOSIS — E7800 Pure hypercholesterolemia, unspecified: Secondary | ICD-10-CM | POA: Diagnosis not present

## 2021-05-01 ENCOUNTER — Encounter: Payer: Self-pay | Admitting: Podiatry

## 2021-05-01 ENCOUNTER — Other Ambulatory Visit: Payer: Self-pay | Admitting: Podiatry

## 2021-05-01 DIAGNOSIS — M25571 Pain in right ankle and joints of right foot: Secondary | ICD-10-CM | POA: Diagnosis not present

## 2021-05-01 DIAGNOSIS — M2041 Other hammer toe(s) (acquired), right foot: Secondary | ICD-10-CM | POA: Diagnosis not present

## 2021-05-01 DIAGNOSIS — M21621 Bunionette of right foot: Secondary | ICD-10-CM | POA: Diagnosis not present

## 2021-05-01 DIAGNOSIS — M21541 Acquired clubfoot, right foot: Secondary | ICD-10-CM | POA: Diagnosis not present

## 2021-05-01 MED ORDER — DICLOFENAC SODIUM 75 MG PO TBEC: 75.0000 mg | DELAYED_RELEASE_TABLET | Freq: Two times a day (BID) | ORAL | 1 refills | Status: DC

## 2021-05-01 MED ORDER — TRAMADOL HCL 50 MG PO TABS: 50.0000 mg | ORAL_TABLET | ORAL | 0 refills | Status: AC | PRN

## 2021-05-01 NOTE — Progress Notes (Signed)
PRN postop 

## 2021-05-02 ENCOUNTER — Telehealth: Payer: Self-pay | Admitting: *Deleted

## 2021-05-02 NOTE — Telephone Encounter (Signed)
Continue diclofenac and tylenol. Discontinue the tramadol. Also cannot take diclofenac and ibuprofen together. Please inform patient. - Dr. Amalia Hailey

## 2021-05-02 NOTE — Telephone Encounter (Signed)
Returned call to patient giving instructions per Dr Amalia Hailey, verbalized understanding.

## 2021-05-02 NOTE — Telephone Encounter (Signed)
Patient is calling because the pain medicine prescribed is making her sick(hot,nausea, thought she would pass out but did not- (Tramadol)recently had surgery 1 day ago. Spoke with patient and asked if she was still taking medication,is not,only taking the diclofenac, ibuprofen- (only has taken once).Please advise.

## 2021-05-07 ENCOUNTER — Ambulatory Visit (INDEPENDENT_AMBULATORY_CARE_PROVIDER_SITE_OTHER): Payer: Medicare Other | Admitting: Podiatry

## 2021-05-07 ENCOUNTER — Other Ambulatory Visit: Payer: Self-pay

## 2021-05-07 ENCOUNTER — Ambulatory Visit (INDEPENDENT_AMBULATORY_CARE_PROVIDER_SITE_OTHER): Payer: Medicare Other

## 2021-05-07 DIAGNOSIS — Z9889 Other specified postprocedural states: Secondary | ICD-10-CM

## 2021-05-07 NOTE — Progress Notes (Signed)
° °  Subjective:  Patient presents today status post hammertoe repair digits 2, 3, 4 right foot with tailor's bunionectomy.. DOS: 05/01/2021.  Patient states that she is doing well.  She was unable to take the pain medication due to nausea and vomiting.  Overall she is doing very well.  She is currently taking diclofenac 2 times daily which alleviates the pain.  She has also been minimally weightbearing in the cam boot but she is concerned because the cam boot seems to rub of the outside of her foot  Past Medical History:  Diagnosis Date   A-fib ([place])    Adrenal nodule ([facility])    Anxiety    Arthritis    Atrophic vaginitis    Chronic hyponatremia    Dysrhythmia    Female climacteric state    GERD (gastroesophageal reflux disease)    Headache    Hypercholesteremia    Hyperlipidemia    Hypertension    Leukopenia    PONV (postoperative nausea and vomiting)    Recurrent UTI    Right thyroid nodule    Thrombocytopenia ([facility])    Tongue ulcer       Objective/Physical Exam Neurovascular status intact.  Skin incisions appear to be well coapted with sutures and staples intact. No sign of infectious process noted. No dehiscence. No active bleeding noted. Moderate edema noted to the surgical extremity.  Radiographic Exam:  Orthopedic hardware and osteotomies sites appear to be stable with routine healing.  Assessment: 1. s/p hammertoe repair digits 2-4 right.  Tailor's bunionectomy right. DOS: 05/01/2021   Plan of Care:  1. Patient was evaluated. X-rays reviewed 2.  Dressings changed.  Clean dry and intact x1 week 3.  Discontinue cam boot.  Postsurgical shoe dispensed.  Weightbearing as tolerated 4.  Return to clinic in 1 week for suture and staple removal   Edrick Kins, DPM [facility]  Dr. Edrick Kins, DPM    [address], [place] [address]                Office [phone]  Fax [phone]

## 2021-05-14 ENCOUNTER — Ambulatory Visit (INDEPENDENT_AMBULATORY_CARE_PROVIDER_SITE_OTHER): Payer: Medicare Other | Admitting: Podiatry

## 2021-05-14 ENCOUNTER — Other Ambulatory Visit: Payer: Self-pay

## 2021-05-14 DIAGNOSIS — E785 Hyperlipidemia, unspecified: Secondary | ICD-10-CM | POA: Diagnosis not present

## 2021-05-14 DIAGNOSIS — E7800 Pure hypercholesterolemia, unspecified: Secondary | ICD-10-CM | POA: Diagnosis not present

## 2021-05-14 DIAGNOSIS — I4891 Unspecified atrial fibrillation: Secondary | ICD-10-CM | POA: Diagnosis not present

## 2021-05-14 DIAGNOSIS — M810 Age-related osteoporosis without current pathological fracture: Secondary | ICD-10-CM | POA: Diagnosis not present

## 2021-05-14 DIAGNOSIS — Z9889 Other specified postprocedural states: Secondary | ICD-10-CM

## 2021-05-14 DIAGNOSIS — I10 Essential (primary) hypertension: Secondary | ICD-10-CM | POA: Diagnosis not present

## 2021-05-14 DIAGNOSIS — K219 Gastro-esophageal reflux disease without esophagitis: Secondary | ICD-10-CM | POA: Diagnosis not present

## 2021-05-14 NOTE — Progress Notes (Signed)
° °  Subjective:  Patient presents today status post hammertoe repair digits 2, 3, 4 right foot with tailor's bunionectomy.. DOS: 05/01/2021.  Patient states that she has doing much better than last week.  Overall improvement.  No new complaints at this time  Past Medical History:  Diagnosis Date   A-fib ([place])    Adrenal nodule ([facility])    Anxiety    Arthritis    Atrophic vaginitis    Chronic hyponatremia    Dysrhythmia    Female climacteric state    GERD (gastroesophageal reflux disease)    Headache    Hypercholesteremia    Hyperlipidemia    Hypertension    Leukopenia    PONV (postoperative nausea and vomiting)    Recurrent UTI    Right thyroid nodule    Thrombocytopenia ([facility])    Tongue ulcer       Objective/Physical Exam Neurovascular status intact.  Skin incisions appear to be well coapted with sutures and staples intact. No sign of infectious process noted. No dehiscence. No active bleeding noted. Moderate edema noted to the surgical extremity.  Overall there is good alignment of the toes  Assessment: 1. s/p hammertoe repair digits 2-4 right.  Tailor's bunionectomy right. DOS: 05/01/2021   Plan of Care:  1. Patient was evaluated. 2.  Sutures and staples were removed today 3.  Patient may begin washing and showering and getting the foot wet.  Recommend triple antibiotic ointment to the percutaneous pins on the distal tips of the toes 4.  Continue minimal weightbearing in the postsurgical shoe 5.  Return to clinic in 3 weeks for pin removal   Edrick Kins, DPM [facility]  Dr. Edrick Kins, DPM    [address], [place] [address]                Office [phone]  Fax [phone]

## 2021-05-21 ENCOUNTER — Other Ambulatory Visit: Payer: Self-pay

## 2021-05-21 ENCOUNTER — Ambulatory Visit (INDEPENDENT_AMBULATORY_CARE_PROVIDER_SITE_OTHER): Payer: Medicare Other | Admitting: Podiatry

## 2021-05-21 DIAGNOSIS — Z9889 Other specified postprocedural states: Secondary | ICD-10-CM

## 2021-05-27 ENCOUNTER — Telehealth: Payer: Self-pay | Admitting: *Deleted

## 2021-05-27 NOTE — Telephone Encounter (Signed)
Lets go ahead and get her in as soon as possible and we will remove the loose pin. - Dr. Amalia Hailey

## 2021-05-27 NOTE — Progress Notes (Signed)
° °  Subjective:  Patient presents today status post hammertoe repair digits 2, 3, 4 right foot with tailor's bunionectomy.. DOS: 05/01/2021.  Patient states that overall she is doing well.  She did have some concern because the percutaneous fixation pin was coming out from the toe.  She has been weightbearing as tolerated in the cam boot as instructed she presents today for further treatment evaluation  Past Medical History:  Diagnosis Date   A-fib ([place])    Adrenal nodule ([facility])    Anxiety    Arthritis    Atrophic vaginitis    Chronic hyponatremia    Dysrhythmia    Female climacteric state    GERD (gastroesophageal reflux disease)    Headache    Hypercholesteremia    Hyperlipidemia    Hypertension    Leukopenia    PONV (postoperative nausea and vomiting)    Recurrent UTI    Right thyroid nodule    Thrombocytopenia ([facility])    Tongue ulcer    Objective/Physical Exam Neurovascular status intact.  Skin incisions appear to be well coapted and healed.  No sign of infectious process noted. No dehiscence. No active bleeding noted.  Minimal edema noted to the surgical extremity.  Overall there is good alignment of the toes  Assessment: 1. s/p hammertoe repair digits 2-4 right.  Tailor's bunionectomy right. DOS: 05/01/2021   Plan of Care:  1. Patient was evaluated. 2.  The percutaneous pin which does appear to be sticking out compared to the adjacent pins was prepped aseptically with Betadine and retrograded back into the toe in the appropriate position.  Patient tolerated this very well. 3.  Ideally I would like to leave the percutaneous fixation pins in for about 6 weeks postop. 4.  Dressings were changed today.  Keep clean dry and intact x1 week 5.  Return to clinic in 1 week for dressing change   Edrick Kins, DPM [facility]  Dr. Edrick Kins, DPM    [address],  [address]                Office [phone]   Fax [phone]

## 2021-05-27 NOTE — Telephone Encounter (Signed)
Patient is calling because her pins are protruding out further than last week, can see thru the bandages, just had them pushed in last week and redressed. She has an upcoming appointment on 06/02/21.Please advise.

## 2021-05-28 ENCOUNTER — Encounter: Payer: Medicare Other | Admitting: Podiatry

## 2021-05-28 ENCOUNTER — Other Ambulatory Visit: Payer: Self-pay

## 2021-05-28 ENCOUNTER — Ambulatory Visit (INDEPENDENT_AMBULATORY_CARE_PROVIDER_SITE_OTHER): Payer: Medicare Other | Admitting: Podiatry

## 2021-05-28 DIAGNOSIS — Z9889 Other specified postprocedural states: Secondary | ICD-10-CM

## 2021-05-28 NOTE — Progress Notes (Signed)
° °  Subjective:  Patient presents today status post hammertoe repair digits 2, 3, 4 right foot with tailor's bunionectomy.. DOS: 05/01/2021.  Overall the patient is doing well she has no pain associated to the foot.  She is worried because the percutaneous fixation pin especially to the second toe has backed out slightly.  She has been weightbearing in the postsurgical shoe as instructed.  She presents for further treatment evaluation  Past Medical History:  Diagnosis Date   A-fib ([place])    Adrenal nodule ([place])    Anxiety    Arthritis    Atrophic vaginitis    Chronic hyponatremia    Dysrhythmia    Female climacteric state    GERD (gastroesophageal reflux disease)    Headache    Hypercholesteremia    Hyperlipidemia    Hypertension    Leukopenia    PONV (postoperative nausea and vomiting)    Recurrent UTI    Right thyroid nodule    Thrombocytopenia ([facility])    Tongue ulcer    Objective/Physical Exam Neurovascular status intact.  Skin incisions healed.  No erythema noted or concern for infection.  No drainage.  The percutaneous fixation pin to the second digit of the foot is slightly backed out about 5 mm more than the others.  Overall the toes are in a good rectus alignment  Assessment: 1. s/p hammertoe repair digits 2-4 right.  Tailor's bunionectomy right. DOS: 05/01/2021   Plan of Care:  1. Patient was evaluated. 2.  The percutaneous pin which does appear to be sticking out slightly compared to the adjacent pins was prepped aseptically with Betadine and retrograded back into the toe in the appropriate position.  Patient tolerated this very well. 3.  Dressings were applied today to the foot to keep clean dry and intact for a week.  Additional reinforcement was applied over the pins to prevent backing out. 4.  Return to clinic in 1 week for dressing change.  Ideally I would like to leave the pins in for an additional 2 weeks   Edrick Kins, DPM [facility]  Dr. Edrick Kins, DPM    [address], [place] [address]                Office [phone]  Fax [phone]

## 2021-05-28 NOTE — Telephone Encounter (Signed)
Patient to come in today to have pins removed, pushing it's way out.

## 2021-06-02 ENCOUNTER — Encounter: Payer: Medicare Other | Admitting: Podiatry

## 2021-06-04 ENCOUNTER — Ambulatory Visit (INDEPENDENT_AMBULATORY_CARE_PROVIDER_SITE_OTHER): Payer: Medicare Other | Admitting: Podiatry

## 2021-06-04 ENCOUNTER — Other Ambulatory Visit: Payer: Self-pay

## 2021-06-04 ENCOUNTER — Encounter: Payer: Medicare Other | Admitting: Podiatry

## 2021-06-04 DIAGNOSIS — Z9889 Other specified postprocedural states: Secondary | ICD-10-CM

## 2021-06-11 ENCOUNTER — Other Ambulatory Visit: Payer: Self-pay

## 2021-06-11 ENCOUNTER — Ambulatory Visit (INDEPENDENT_AMBULATORY_CARE_PROVIDER_SITE_OTHER): Payer: Medicare Other | Admitting: Podiatry

## 2021-06-11 ENCOUNTER — Ambulatory Visit (INDEPENDENT_AMBULATORY_CARE_PROVIDER_SITE_OTHER): Payer: Medicare Other

## 2021-06-11 DIAGNOSIS — Z9889 Other specified postprocedural states: Secondary | ICD-10-CM

## 2021-06-11 NOTE — Progress Notes (Signed)
° °  Subjective:  Patient presents today status post hammertoe repair digits 2, 3, 4 right foot with tailor's bunionectomy.. DOS: 05/01/2021.  Patient is doing well.  No significant change since last week.  She is ready to have the pins removed.  Past Medical History:  Diagnosis Date   A-fib ([place])    Adrenal nodule ([facility])    Anxiety    Arthritis    Atrophic vaginitis    Chronic hyponatremia    Dysrhythmia    Female climacteric state    GERD (gastroesophageal reflux disease)    Headache    Hypercholesteremia    Hyperlipidemia    Hypertension    Leukopenia    PONV (postoperative nausea and vomiting)    Recurrent UTI    Right thyroid nodule    Thrombocytopenia ([facility])    Tongue ulcer    Objective/Physical Exam Neurovascular status intact.  Skin incisions healed.  No erythema noted or concern for infection.  No drainage.  The percutaneous fixation pin to the second digit of the foot is stable and intact.  Overall the toes are in a good rectus alignment  Radiographic exam: Post removal of the percutaneous fixation pins.  Arthroplasty stable good alignment of the digits.  Assessment: 1. s/p hammertoe repair digits 2-4 right.  Tailor's bunionectomy right. DOS: 05/01/2021   Plan of Care:  1. Patient was evaluated. 2.  Percutaneous fixation pins removed today 3.  Patient may transition out of the surgical shoe into good supportive sneakers.  Patient may slowly increase activity 4.  Return to clinic in 4 weeks for follow-up x-ray  Edrick Kins, DPM [facility]  Dr. Edrick Kins, DPM    [address], [place] [address]                Office [phone]  Fax [phone]

## 2021-06-11 NOTE — Progress Notes (Signed)
° °  Subjective:  Patient presents today status post hammertoe repair digits 2, 3, 4 right foot with tailor's bunionectomy.. DOS: 05/01/2021.  The patient states that she is doing much better.  She does not have any pain associated to the foot.  The pins are still intact.  Presenting for dressing change and follow-up treatment and evaluation  Past Medical History:  Diagnosis Date   A-fib ([place])    Adrenal nodule ([place])    Anxiety    Arthritis    Atrophic vaginitis    Chronic hyponatremia    Dysrhythmia    Female climacteric state    GERD (gastroesophageal reflux disease)    Headache    Hypercholesteremia    Hyperlipidemia    Hypertension    Leukopenia    PONV (postoperative nausea and vomiting)    Recurrent UTI    Right thyroid nodule    Thrombocytopenia ([facility])    Tongue ulcer    Objective/Physical Exam Neurovascular status intact.  Skin incisions healed.  No erythema noted or concern for infection.  No drainage.  The percutaneous fixation pin to the second digit of the foot is stable and intact.  Overall the toes are in a good rectus alignment  Assessment: 1. s/p hammertoe repair digits 2-4 right.  Tailor's bunionectomy right. DOS: 05/01/2021   Plan of Care:  1. Patient was evaluated. 2.  The percutaneous pins are intact today and stable. 3.  Dressings were reapplied today to keep clean dry and intact for 1 additional week 4.  Return to clinic in 1 week for removal of percutaneous pins and follow-up x-ray 5.  Continue minimal weightbearing in the postsurgical shoes as instructed   Edrick Kins, DPM [facility]  Dr. Edrick Kins, DPM    [address], [place] [address]                Office [phone]  Fax [phone]

## 2021-06-20 DIAGNOSIS — H25812 Combined forms of age-related cataract, left eye: Secondary | ICD-10-CM | POA: Diagnosis not present

## 2021-06-20 DIAGNOSIS — H25012 Cortical age-related cataract, left eye: Secondary | ICD-10-CM | POA: Diagnosis not present

## 2021-06-20 DIAGNOSIS — H2512 Age-related nuclear cataract, left eye: Secondary | ICD-10-CM | POA: Diagnosis not present

## 2021-07-01 DIAGNOSIS — M270 Developmental disorders of jaws: Secondary | ICD-10-CM | POA: Diagnosis not present

## 2021-07-01 DIAGNOSIS — C029 Malignant neoplasm of tongue, unspecified: Secondary | ICD-10-CM | POA: Diagnosis not present

## 2021-07-04 ENCOUNTER — Telehealth: Payer: Self-pay | Admitting: *Deleted

## 2021-07-04 NOTE — Telephone Encounter (Signed)
CALLED PATIENT TO INFORM OF CT FOR 07-17-21 - ARRIVAL TIME- 1 PM @ [facility], NO RESTRICTIONS TO TEST, PATIENT TO RECEIVE RESULTS FROM DR. SQUIRE ON 07-18-21 @ 10 AM FOR RESULTS, SPOKE WITH PATIENT AND SHE IS AWARE OF THESE APPTS. ?

## 2021-07-09 ENCOUNTER — Ambulatory Visit (INDEPENDENT_AMBULATORY_CARE_PROVIDER_SITE_OTHER): Payer: Medicare Other | Admitting: Podiatry

## 2021-07-09 ENCOUNTER — Ambulatory Visit (INDEPENDENT_AMBULATORY_CARE_PROVIDER_SITE_OTHER): Payer: Medicare Other

## 2021-07-09 ENCOUNTER — Other Ambulatory Visit: Payer: Self-pay

## 2021-07-09 DIAGNOSIS — M2041 Other hammer toe(s) (acquired), right foot: Secondary | ICD-10-CM

## 2021-07-09 DIAGNOSIS — Z9889 Other specified postprocedural states: Secondary | ICD-10-CM

## 2021-07-09 NOTE — Progress Notes (Signed)
? ?  Subjective:  ?Patient presents today status post hammertoe repair digits 2, 3, 4 right foot with tailor's bunionectomy.. DOS: 05/01/2021.  Patient is doing well.  She is walking in tennis shoes just fine.  No pain.  She presents for final follow-up treatment and evaluation ? ?Past Medical History:  ?Diagnosis Date  ? A-fib ([place])   ? Adrenal nodule ([place])   ? Anxiety   ? Arthritis   ? Atrophic vaginitis   ? Chronic hyponatremia   ? Dysrhythmia   ? Female climacteric state   ? GERD (gastroesophageal reflux disease)   ? Headache   ? Hypercholesteremia   ? Hyperlipidemia   ? Hypertension   ? Leukopenia   ? PONV (postoperative nausea and vomiting)   ? Recurrent UTI   ? Right thyroid nodule   ? Thrombocytopenia ([place])   ? Tongue ulcer   ? ?Objective/Physical Exam ?Neurovascular status intact.  Skin incisions healed.  Toes are in a slightly curved position especially to the fourth toe but overall there is significant improvement and the patient is very satisfied ?Radiographic exam: There is some slight curvature of the digits however overall arthroplasty stable with decent alignment. ? ?Assessment: ?1. s/p hammertoe repair digits 2-4 right.  Tailor's bunionectomy right. DOS: 05/01/2021 ? ? ?Plan of Care:  ?1. Patient was evaluated.  X-rays reviewed ?2.  Overall the patient is doing very well.  She is walking in tennis shoes with no issues ?3.  Patient may now resume full activity no restrictions ?4.  Return to clinic as needed ? ?Edrick Kins, DPM ?[place] ? ?Dr. Edrick Kins, DPM  ?  ?[address]                                    ?[place], [place] [address]                ?Office [phone]  ?Fax [phone] ? ? ? ? ? ?

## 2021-07-14 DIAGNOSIS — M810 Age-related osteoporosis without current pathological fracture: Secondary | ICD-10-CM | POA: Diagnosis not present

## 2021-07-14 DIAGNOSIS — I10 Essential (primary) hypertension: Secondary | ICD-10-CM | POA: Diagnosis not present

## 2021-07-14 DIAGNOSIS — K219 Gastro-esophageal reflux disease without esophagitis: Secondary | ICD-10-CM | POA: Diagnosis not present

## 2021-07-14 DIAGNOSIS — E7800 Pure hypercholesterolemia, unspecified: Secondary | ICD-10-CM | POA: Diagnosis not present

## 2021-07-17 ENCOUNTER — Other Ambulatory Visit: Payer: Self-pay

## 2021-07-17 ENCOUNTER — Ambulatory Visit (HOSPITAL_COMMUNITY)
Admission: RE | Admit: 2021-07-17 | Discharge: 2021-07-17 | Disposition: A | Discharge status: Home / Self Care | Payer: Medicare Other | Source: Ambulatory Visit | Attending: Radiation Oncology | Admitting: Radiation Oncology

## 2021-07-17 DIAGNOSIS — R911 Solitary pulmonary nodule: Secondary | ICD-10-CM | POA: Diagnosis not present

## 2021-07-17 DIAGNOSIS — I700 Atherosclerosis of aorta: Secondary | ICD-10-CM | POA: Diagnosis not present

## 2021-07-17 DIAGNOSIS — C021 Malignant neoplasm of border of tongue: Secondary | ICD-10-CM

## 2021-07-18 ENCOUNTER — Ambulatory Visit
Admission: RE | Admit: 2021-07-18 | Discharge: 2021-07-18 | Disposition: A | Discharge status: Home / Self Care | Payer: Medicare Other | Source: Ambulatory Visit | Attending: Radiation Oncology | Admitting: Radiation Oncology

## 2021-07-18 ENCOUNTER — Encounter: Payer: Self-pay | Admitting: Radiation Oncology

## 2021-07-18 ENCOUNTER — Other Ambulatory Visit: Payer: Self-pay

## 2021-07-18 VITALS — BP 148/61 | HR 53 | Temp 97.3°F | Resp 18 | Ht 65.0 in | Wt 134.4 lb

## 2021-07-18 DIAGNOSIS — Z79899 Other long term (current) drug therapy: Secondary | ICD-10-CM | POA: Diagnosis not present

## 2021-07-18 DIAGNOSIS — C021 Malignant neoplasm of border of tongue: Secondary | ICD-10-CM | POA: Diagnosis not present

## 2021-07-18 DIAGNOSIS — Z7982 Long term (current) use of aspirin: Secondary | ICD-10-CM | POA: Diagnosis not present

## 2021-07-18 DIAGNOSIS — Z85810 Personal history of malignant neoplasm of tongue: Secondary | ICD-10-CM | POA: Diagnosis not present

## 2021-07-18 DIAGNOSIS — I700 Atherosclerosis of aorta: Secondary | ICD-10-CM | POA: Diagnosis not present

## 2021-07-18 DIAGNOSIS — Z791 Long term (current) use of non-steroidal anti-inflammatories (NSAID): Secondary | ICD-10-CM | POA: Insufficient documentation

## 2021-07-18 DIAGNOSIS — R911 Solitary pulmonary nodule: Secondary | ICD-10-CM | POA: Diagnosis not present

## 2021-07-18 DIAGNOSIS — Z08 Encounter for follow-up examination after completed treatment for malignant neoplasm: Secondary | ICD-10-CM | POA: Diagnosis not present

## 2021-07-18 NOTE — Progress Notes (Signed)
Ms. Esteban presents today for follow-up after ending radiation treatment early to the left side of her tongue on 04/17/2020, and to review CT scan results from 07/17/2021 ? ?Pain issues, if any: Patient denies ?Using a feeding tube?: N/A ?Weight changes, if any:  ?Wt Readings from Last 3 Encounters:  ?07/18/21 134 lb 6 oz (61 kg)  ?07/12/20 134 lb 12.8 oz (61.1 kg)  ?04/19/20 140 lb (63.5 kg)  ? ?Swallowing issues, if any: Reports minor improvement. States she feels her tongue is working more effectively on the right side. Continues to have to ensure food is on the right side of her mouth, otherwise she struggles due to decreased funtion on the left. Unable to tolerate bread products ?Smoking or chewing tobacco? None ?Using fluoride trays daily? Uses fluroide toothpaste; Sees her dentist every 4 months.  ?Last ENT visit was on: 07/01/2021 Saw Dr. [name]  ?"--Physical Exam:  ?Healthy-appearing lady no distress. Breathing and voice are clear. No palpable neck masses or adenopathy. Ear canals drums and middle ears look healthy. Nasal exam is unremarkable. Oral cavity and pharynx reveals stable mandibular tori. Tongue mobility is good without any new mucosal lesions visible or palpable. ?--Impression & Plans:  ?Stable, no evidence of recurrent disease. Follow-up 3 months or sooner as needed." ? ?Other notable issues, if any: Denies any ear or jaw pain. Continues to deal with altered since of taste. Reports occasional firmness to the left side of her neck, but states it doesn't impact her range of motion. Reports she feels improved compared to her last visit with Dr. Isidore Moos, but has not returned to her baseline (though she feels her continued symptoms are manageable) ? ? ? ? ?

## 2021-07-18 NOTE — Progress Notes (Signed)
?Radiation Oncology         [phone] ?________________________________ ? ?Name: Erika [name] MRN: [id number]  ?Date: 07/18/2021  DOB: 08-Nov-1945 ? ?Follow-Up Visit Note ? ?CC: Koirala, Dibas, MD  Koirala, Dibas, MD ? ?Diagnosis and Prior Radiotherapy:     C02.1 ?  ICD-10-CM   ?1. Squamous cell carcinoma of lateral tongue (HCC)  C02.1   ?  ? ? ?Cancer Staging:  Cancer Staging ?Tongue cancer ([place]) ?Staging form: Oral Cavity, AJCC 8th Edition ?- Pathologic: Stage III (pT3, pN0, cM0) - Signed by Eppie Gibson, MD on 03/15/2020 ? ?Intent: Curative ? ?Radiation Treatment Dates: 04/01/2020 through 04/17/2020 (stopped prematurely) ?Site Technique Total Dose (Gy) Dose per Fx (Gy) Completed Fx Beam Energies  ?Tongue: HN_L_Tongu IMRT 32.5/50 2.5 13/20 6X  ? ?CHIEF COMPLAINT:  Here for follow-up and surveillance of tongue cancer ? ?Narrative:  Erika [name] presents today for follow-up after ending radiation treatment early to the left side of her tongue on 04/17/2020, and to review CT scan results from 07/17/2021 ? ?Pain issues, if any: Patient denies ?Using a feeding tube?: N/A ?Weight changes, if any:  ?Wt Readings from Last 3 Encounters:  ?07/18/21 134 lb 6 oz (61 kg)  ?07/12/20 134 lb 12.8 oz (61.1 kg)  ?04/19/20 140 lb (63.5 kg)  ? ?Swallowing issues, if any: Reports minor improvement. States she feels her tongue is working more effectively on the right side. Continues to have to ensure food is on the right side of her mouth, otherwise she struggles due to decreased funtion on the left. Unable to tolerate bread products ?Smoking or chewing tobacco? None ?Using fluoride trays daily? Uses fluroide toothpaste; Sees her dentist every 4 months.  ?Last ENT visit was on: 07/01/2021 Saw Dr. [name]  ?"--Physical Exam:  ?Healthy-appearing lady no distress. Breathing and voice are clear. No palpable neck masses or adenopathy. Ear canals drums and middle ears look healthy. Nasal exam is unremarkable. Oral cavity and pharynx  reveals stable mandibular tori. Tongue mobility is good without any new mucosal lesions visible or palpable. ?--Impression & Plans:  ?Stable, no evidence of recurrent disease. Follow-up 3 months or sooner as needed." ? ?Other notable issues, if any: Denies any ear or jaw pain. Continues to deal with altered since of taste. Reports occasional firmness to the left side of her neck, but states it doesn't impact her range of motion. Reports she feels improved compared to her last visit with rad onc, but has not returned to her baseline (though she feels her continued symptoms are manageable) ? ?I personally reviewed her images.  CT scan of her chest is clear of any evidence of metastatic disease ? ?ALLERGIES:  is allergic to hydrochlorothiazide, atorvastatin, codeine, and strawberry flavor. ? ?Meds: ?Current Outpatient Medications  ?Medication Sig Dispense Refill  ? acetaminophen (TYLENOL) 500 MG tablet Take 1,000 mg by mouth every 6 (six) hours as needed for moderate pain or headache.    ? amLODipine (NORVASC) 5 MG tablet Take 1 tablet (5 mg total) by mouth daily. Please make overdue appt with Dr. Tamala Julian before anymore refills. 3rd and Final Attempt (Patient taking differently: Take 5 mg by mouth at bedtime. Please make overdue appt with Dr. Tamala Julian before anymore refills. 3rd and Final Attempt) 15 tablet 0  ? amLODipine (NORVASC) 5 MG tablet 1 tablet    ? aspirin EC 81 MG tablet Take 81 mg by mouth daily.    ? Calcium Carb-Cholecalciferol (CALCIUM 600 + D PO) Take 1 tablet by  mouth daily.    ? Carboxymethylcellulose Sodium (ARTIFICIAL TEARS OP) Place 1 drop into both eyes daily as needed (dry eyes).    ? cetirizine (ZYRTEC) 10 MG tablet Take 10 mg by mouth daily.    ? Cranberry 250 MG CAPS See admin instructions.    ? diclofenac (VOLTAREN) 75 MG EC tablet Take 1 tablet (75 mg total) by mouth 2 (two) times daily. 60 tablet 1  ? diclofenac Sodium (VOLTAREN) 1 % GEL Apply 1 application topically 4 (four) times daily as  needed (pain).    ? esomeprazole (NEXIUM) 40 MG capsule Take 40 mg by mouth daily.  5  ? famotidine (PEPCID) 20 MG tablet Take 20 mg by mouth at bedtime.     ? Fiber Adult Gummies 2 g CHEW See admin instructions.    ? fluticasone (FLONASE) 50 MCG/ACT nasal spray INSTILL 2 SPRAYS IN EACH NOSTRIL EVERY DAY. (Patient taking differently: Place 2 sprays into both nostrils daily.) 16 g 3  ? lidocaine (XYLOCAINE) 2 % solution Patient: Mix 1part 2% viscous lidocaine, 1part H20. Swish & swallow 54m of diluted mixture, 316m before meals and at bedtime, up to 5 times daily. (Patient taking differently: Use as directed 15 mLs in the mouth or throat See admin instructions. Patient: Mix 1part 2% viscous lidocaine, 1part H20. Swish & swallow 37m44mf diluted mixture, 25m27mefore meals and at bedtime, up to 5 times daily.) 200 mL 5  ? losartan (COZAAR) 100 MG tablet Take 1 tablet (100 mg total) by mouth daily. Please make overdue appt with Dr. SmitTamala Julianore anymore refills. 2nd attempt 15 tablet 0  ? metoCLOPramide (REGLAN) 10 MG tablet Take 1 tablet (10 mg total) by mouth every 8 (eight) hours as needed for nausea, vomiting or refractory nausea / vomiting. 90 tablet 0  ? metoprolol tartrate (LOPRESSOR) 25 MG tablet Take 25 mg by mouth 2 (two) times daily.     ? moxifloxacin (VIGAMOX) 0.5 % ophthalmic solution     ? Multiple Vitamin (MULTIVITAMIN WITH MINERALS) TABS tablet Take 1 tablet by mouth daily.    ? Multiple Vitamins-Minerals (HAIR SKIN & NAILS ADVANCED) TABS See admin instructions.    ? nitrofurantoin (MACRODANTIN) 50 MG capsule Take 50 mg by mouth at bedtime.     ? ondansetron (ZOFRAN ODT) 4 MG disintegrating tablet Take 1 tablet (4 mg total) by mouth every 8 (eight) hours as needed for nausea or vomiting. (Patient not taking: Reported on 07/12/2020) 20 tablet 0  ? prednisoLONE acetate (PRED FORTE) 1 % ophthalmic suspension     ? Probiotic Product (RA PROBIOTIC GUMMIES) CHEW Chew 2 capsules by mouth daily.    ? scopolamine  (TRANSDERM-SCOP) 1 MG/3DAYS Place 1 patch (1.5 mg total) onto the skin every 3 (three) days. (Patient not taking: Reported on 07/12/2020) 10 patch 0  ? simvastatin (ZOCOR) 20 MG tablet Take 20 mg by mouth at bedtime.     ? trolamine salicylate (ASPERCREME) 10 % cream Apply 1 application topically as needed for muscle pain.    ? vitamin B-12 (CYANOCOBALAMIN) 100 MCG tablet Take 100 mcg by mouth daily.    ? ?No current facility-administered medications for this encounter.  ? ? ?Physical Findings: ?The patient is in no acute distress. Patient is alert and oriented. ?Wt Readings from Last 3 Encounters:  ?07/18/21 134 lb 6 oz (61 kg)  ?07/12/20 134 lb 12.8 oz (61.1 kg)  ?04/19/20 140 lb (63.5 kg)  ? ? height is '5\' 5"'$  (1.651 m) and  weight is 134 lb 6 oz (61 kg). Her temporal temperature is 97.3 ?F (36.3 ?C) (abnormal). Her blood pressure is 148/61 (abnormal) and her pulse is 53 (abnormal). Her respiration is 18 and oxygen saturation is 100%. Marland Kitchen  ?General: Alert and oriented, in no acute distress ?HEENT: Head is normocephalic. Extraocular movements are intact. Oropharynx is notable for no lesions in the upper throat or throughout the oral cavity ?Neck: Neck is notable for no palpable adenopathy in cervical or supraclavicular regions ?Skin: Skin in treatment fields shows satisfactory healing  ?Heart: slightly bradycardic with no murmurs, rubs, or gallops. ?Chest: Clear to auscultation bilaterally, with no rhonchi, wheezes, or rales. ?Abdomen: Soft, nontender, nondistended, with no rigidity or guarding. ?Lymphatics: see Neck Exam ?Psychiatric: Judgment and insight are intact. Affect is appropriate. ? ? ?Lab Findings: ?Lab Results  ?Component Value Date  ? WBC 5.1 04/20/2020  ? HGB 12.4 04/20/2020  ? HCT 35.8 (L) 04/20/2020  ? MCV 88.4 04/20/2020  ? PLT 308 04/20/2020  ? ? ?Lab Results  ?Component Value Date  ? TSH 0.925 03/19/2020  ? ? ?Radiographic Findings: ?CT Chest Wo Contrast ? ?Result Date: 07/18/2021 ?CLINICAL DATA:   76 year old female with history of squamous cell carcinoma of the tongue. Follow-up study. * Tracking Code: BO * EXAM: CT CHEST WITHOUT CONTRAST TECHNIQUE: Multidetector CT imaging of the chest was performed fo

## 2021-07-21 ENCOUNTER — Other Ambulatory Visit: Payer: Self-pay

## 2021-07-21 DIAGNOSIS — C021 Malignant neoplasm of border of tongue: Secondary | ICD-10-CM

## 2021-08-20 DIAGNOSIS — K219 Gastro-esophageal reflux disease without esophagitis: Secondary | ICD-10-CM | POA: Diagnosis not present

## 2021-08-20 DIAGNOSIS — E785 Hyperlipidemia, unspecified: Secondary | ICD-10-CM | POA: Diagnosis not present

## 2021-08-20 DIAGNOSIS — I10 Essential (primary) hypertension: Secondary | ICD-10-CM | POA: Diagnosis not present

## 2021-08-20 DIAGNOSIS — M810 Age-related osteoporosis without current pathological fracture: Secondary | ICD-10-CM | POA: Diagnosis not present

## 2021-08-27 DIAGNOSIS — Z79899 Other long term (current) drug therapy: Secondary | ICD-10-CM | POA: Diagnosis not present

## 2021-08-27 DIAGNOSIS — R300 Dysuria: Secondary | ICD-10-CM | POA: Diagnosis not present

## 2021-08-27 DIAGNOSIS — M810 Age-related osteoporosis without current pathological fracture: Secondary | ICD-10-CM | POA: Diagnosis not present

## 2021-08-27 DIAGNOSIS — N3000 Acute cystitis without hematuria: Secondary | ICD-10-CM | POA: Diagnosis not present

## 2021-08-27 DIAGNOSIS — C049 Malignant neoplasm of floor of mouth, unspecified: Secondary | ICD-10-CM | POA: Diagnosis not present

## 2021-10-03 DIAGNOSIS — M810 Age-related osteoporosis without current pathological fracture: Secondary | ICD-10-CM | POA: Diagnosis not present

## 2021-10-07 DIAGNOSIS — C029 Malignant neoplasm of tongue, unspecified: Secondary | ICD-10-CM | POA: Diagnosis not present

## 2021-10-13 DIAGNOSIS — M810 Age-related osteoporosis without current pathological fracture: Secondary | ICD-10-CM | POA: Diagnosis not present

## 2021-10-13 DIAGNOSIS — I10 Essential (primary) hypertension: Secondary | ICD-10-CM | POA: Diagnosis not present

## 2021-10-13 DIAGNOSIS — K219 Gastro-esophageal reflux disease without esophagitis: Secondary | ICD-10-CM | POA: Diagnosis not present

## 2021-10-13 DIAGNOSIS — E785 Hyperlipidemia, unspecified: Secondary | ICD-10-CM | POA: Diagnosis not present

## 2021-12-01 ENCOUNTER — Ambulatory Visit (INDEPENDENT_AMBULATORY_CARE_PROVIDER_SITE_OTHER): Payer: Medicare Other

## 2021-12-01 ENCOUNTER — Ambulatory Visit: Payer: Medicare Other

## 2021-12-01 ENCOUNTER — Ambulatory Visit (INDEPENDENT_AMBULATORY_CARE_PROVIDER_SITE_OTHER): Payer: Medicare Other | Admitting: Podiatry

## 2021-12-01 DIAGNOSIS — M76822 Posterior tibial tendinitis, left leg: Secondary | ICD-10-CM

## 2021-12-01 DIAGNOSIS — R52 Pain, unspecified: Secondary | ICD-10-CM | POA: Diagnosis not present

## 2021-12-01 MED ORDER — BETAMETHASONE SOD PHOS & ACET 6 (3-3) MG/ML IJ SUSP
3.0000 mg | Freq: Once | INTRAMUSCULAR | Status: AC
  Administered 2021-12-01: 3 mg via INTRA_ARTICULAR

## 2021-12-01 NOTE — Progress Notes (Signed)
Chief Complaint  Patient presents with   Foot Pain    r arch pain    HPI: 76 y.o. female presenting today for new complaint of pain to the left foot.  Patient states that this past summer she has been experiencing an increase of left foot pain.  Patient denies a history of injury.  She says that the left foot pain has slowly increased over the past few months.  She is concerned for possible arthritis.  She does have a history of right forefoot surgery and she says that she is doing very well to her right foot.  No pain or tenderness associated to the surgical right foot  Past Medical History:  Diagnosis Date   A-fib ([place])    Adrenal nodule (HCC)    Anxiety    Arthritis    Atrophic vaginitis    Chronic hyponatremia    Dysrhythmia    Female climacteric state    GERD (gastroesophageal reflux disease)    Headache    Hypercholesteremia    Hyperlipidemia    Hypertension    Leukopenia    PONV (postoperative nausea and vomiting)    Recurrent UTI    Right thyroid nodule    Thrombocytopenia ([place])    Tongue ulcer     Past Surgical History:  Procedure Laterality Date   ESOPHAGOGASTRODUODENOSCOPY     EXCISION OF TONGUE LESION N/A 02/16/2020   Procedure: EXCISION OF TONGUE LESION Partial Glossectomy;  Surgeon: [name], MD;  Location: [facility];  Service: ENT;  Laterality: N/A;   HERNIA REPAIR     x2  groin   JOINT REPLACEMENT     PARTIAL GLOSSECTOMY  02/16/2020   EXCISION OF TONGUE LESION Partial Glossectomy (N/A [place])   RADICAL NECK DISSECTION Left 02/16/2020   Procedure: RADICAL NECK DISSECTION Partial Left Neck Dissection;  Surgeon: [name], MD;  Location: [facility];  Service: ENT;  Laterality: Left;   REPLACEMENT TOTAL KNEE     right knee   SKIN CANCER EXCISION  05/2017   Excised from calf area   TONSILLECTOMY     TUBAL LIGATION      Allergies  Allergen Reactions   Hydrochlorothiazide Other (See Comments)    Brain swelling   Atorvastatin Other (See Comments)     cramps   Codeine Nausea And Vomiting   Strawberry Flavor Itching     Physical Exam: General: The patient is alert and oriented x3 in no acute distress.  Dermatology: Skin is warm, dry and supple bilateral lower extremities. Negative for open lesions or macerations.  Vascular: Palpable pedal pulses bilaterally. Capillary refill within normal limits.  Negative for any significant edema or erythema  Neurological: Light touch and protective threshold grossly intact  Musculoskeletal Exam: Hammertoe contracture also noted to the lesser digits of the foot.  There is pain on palpation throughout the midtarsal joint left foot as well as along the posterior tibial tendon left  Radiographic Exam LT foot 12/01/2021:  Normal osseous mineralization.  There does appear to be some degenerative changes noted throughout the midtarsal joint of the left foot.  No acute fractures identified.  Assessment: 1.  Posterior tibial tendinitis left 2.  DJD midtarsal joint left 3.  PSxHx HT repair 2-4, Tailor's bunionectomy RT. 05/01/2021   Plan of Care:  1. Patient evaluated. X-Rays reviewed.   2.  I do believe the patient is experiencing some arthritic pain throughout the mid tarsal joint of the left foot.  Explained this to the patient  and she agrees.  She states that she has arthritis through several other parts of her body which is normal given her age 65.  Injection of 0.5 cc Celestone Soluspan injected along the posterior tibial tendon sheath left 4.  Continue wearing good supportive shoes and sandals that provide plenty of arch support 5.  Continue OTC Tylenol as needed  6.  Return to clinic as needed      Edrick Kins, DPM [facility]  Dr. Edrick Kins, DPM    [address], [place] [address]                Office [phone]  Fax [phone]

## 2022-01-08 DIAGNOSIS — C029 Malignant neoplasm of tongue, unspecified: Secondary | ICD-10-CM | POA: Diagnosis not present

## 2022-01-09 DIAGNOSIS — I10 Essential (primary) hypertension: Secondary | ICD-10-CM | POA: Diagnosis not present

## 2022-01-09 DIAGNOSIS — K219 Gastro-esophageal reflux disease without esophagitis: Secondary | ICD-10-CM | POA: Diagnosis not present

## 2022-01-09 DIAGNOSIS — M810 Age-related osteoporosis without current pathological fracture: Secondary | ICD-10-CM | POA: Diagnosis not present

## 2022-01-09 DIAGNOSIS — E785 Hyperlipidemia, unspecified: Secondary | ICD-10-CM | POA: Diagnosis not present

## 2022-01-16 DIAGNOSIS — M7062 Trochanteric bursitis, left hip: Secondary | ICD-10-CM | POA: Diagnosis not present

## 2022-01-16 DIAGNOSIS — M25552 Pain in left hip: Secondary | ICD-10-CM | POA: Diagnosis not present

## 2022-01-19 DIAGNOSIS — M25562 Pain in left knee: Secondary | ICD-10-CM | POA: Diagnosis not present

## 2022-01-19 DIAGNOSIS — M1712 Unilateral primary osteoarthritis, left knee: Secondary | ICD-10-CM | POA: Diagnosis not present

## 2022-01-22 DIAGNOSIS — Z23 Encounter for immunization: Secondary | ICD-10-CM | POA: Diagnosis not present

## 2022-01-22 DIAGNOSIS — I10 Essential (primary) hypertension: Secondary | ICD-10-CM | POA: Diagnosis not present

## 2022-01-22 DIAGNOSIS — N898 Other specified noninflammatory disorders of vagina: Secondary | ICD-10-CM | POA: Diagnosis not present

## 2022-02-03 DIAGNOSIS — M1712 Unilateral primary osteoarthritis, left knee: Secondary | ICD-10-CM | POA: Diagnosis not present

## 2022-02-03 DIAGNOSIS — M25562 Pain in left knee: Secondary | ICD-10-CM | POA: Diagnosis not present

## 2022-03-16 IMAGING — CT CT NECK W/ CM
3 of 4 series · 14 of 33 positions shown, 17 images · IV contrast (OMNIPAQUE)
Comparison: None.

CLINICAL DATA: New diagnosis tongue cancer

EXAM:
CT NECK WITH CONTRAST
TECHNIQUE: Multidetector CT imaging of the neck was performed using the
standard protocol following the bolus administration of intravenous
contrast.
CONTRAST:  75mL OMNIPAQUE IOHEXOL 300 MG/ML  SOLN

[Series 5: orthogonal ax · axial · 0.39mm/px · z∈[-80,+97]mm · 6 of 127 slices shown, 8 images]
[im 19/127  soft-tissue]
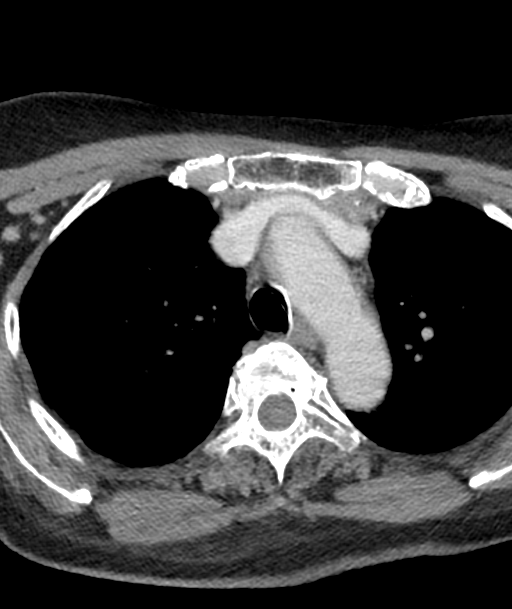
[im 19/127  bone]
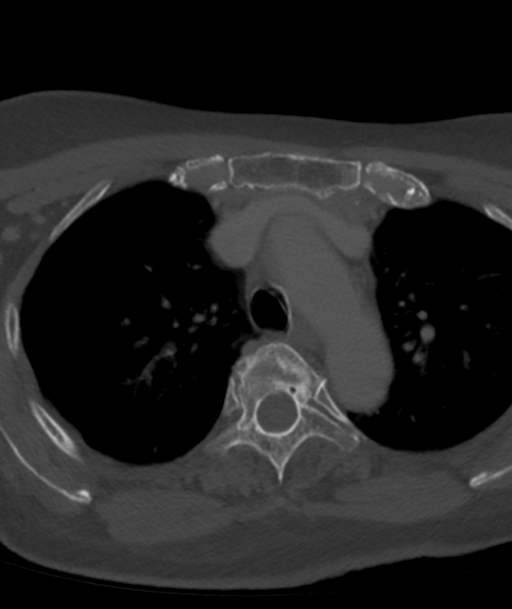
[im 37/127  bone]
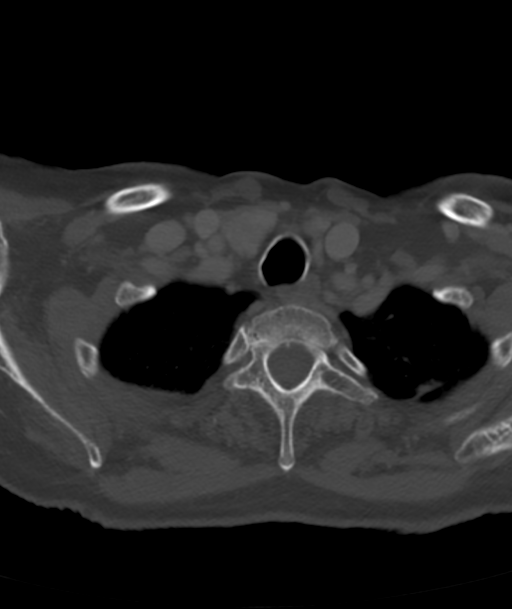
[im 55/127  bone]
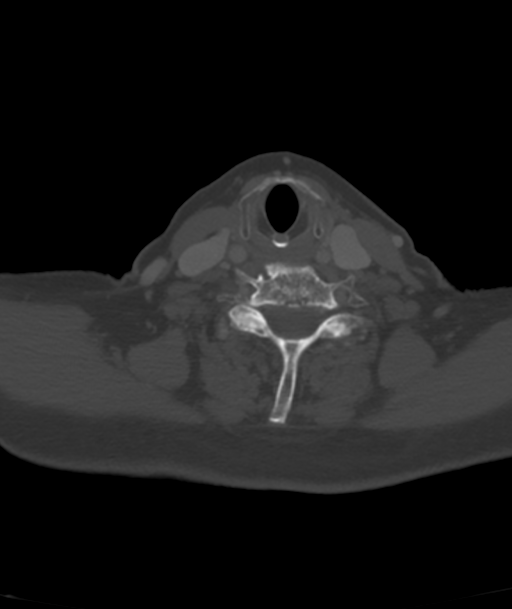
[im 73/127  bone]
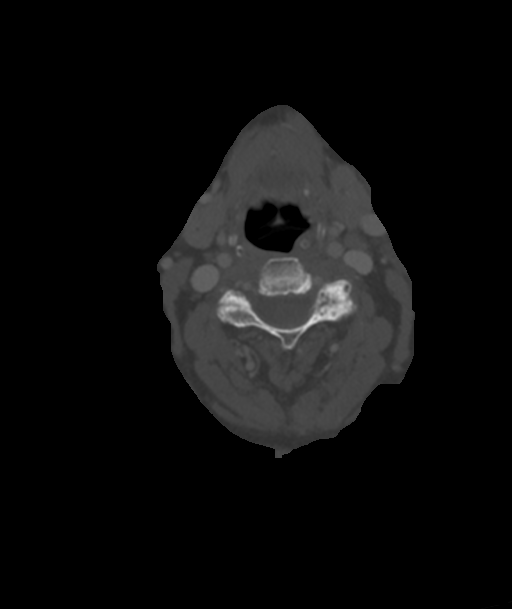
[im 91/127  soft-tissue]
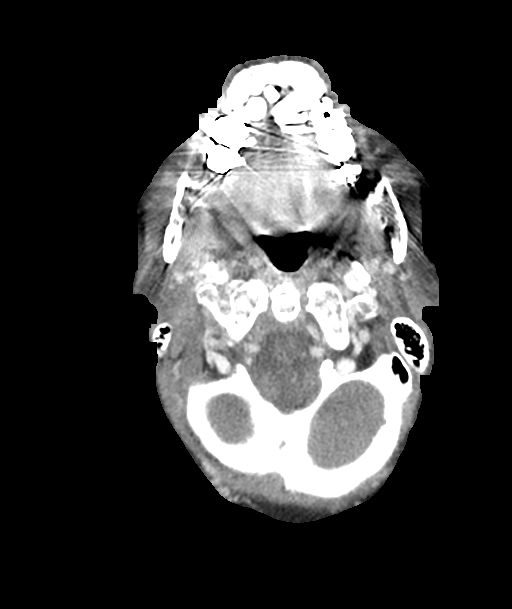
[im 91/127  bone]
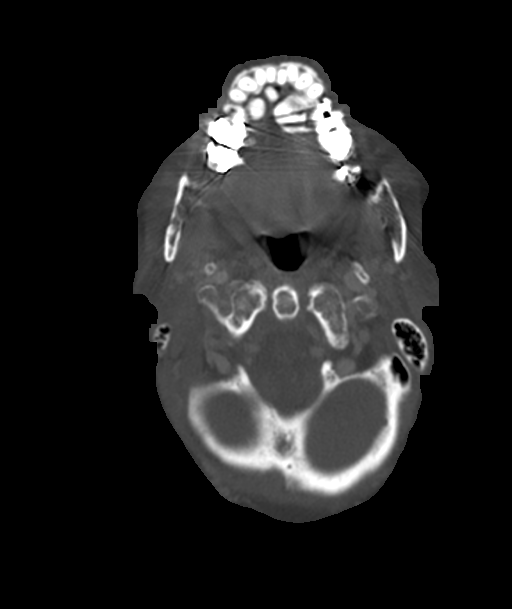
[im 109/127  bone]
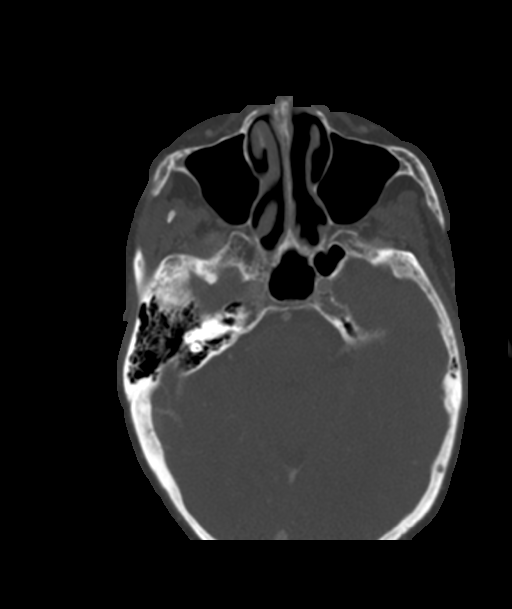

[Series 6: cor neck · coronal · 0.45mm/px · 3 of 113 slices shown]
[im 23/113  bone]
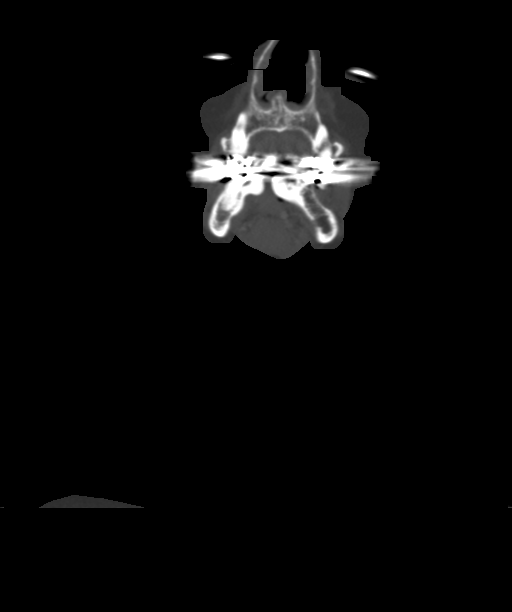
[im 45/113  bone]
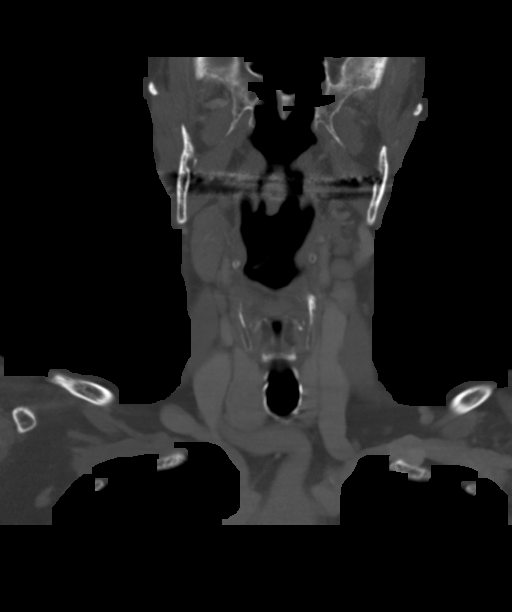
[im 68/113  bone]
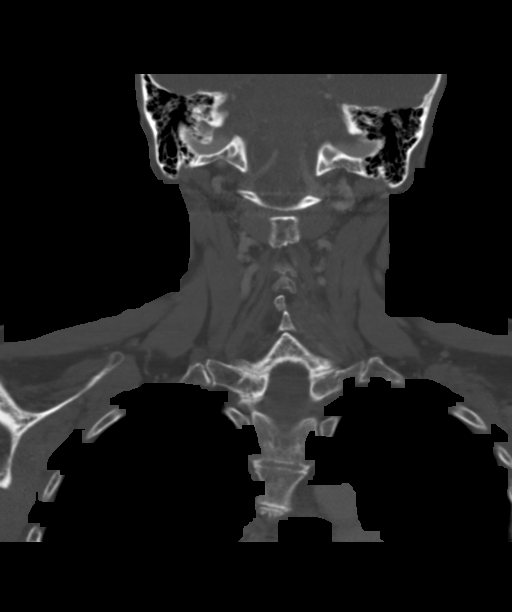

[Series 7: sag neck · sagittal · 0.45mm/px · 5 of 101 slices shown, 6 images]
[im 34/101  bone]
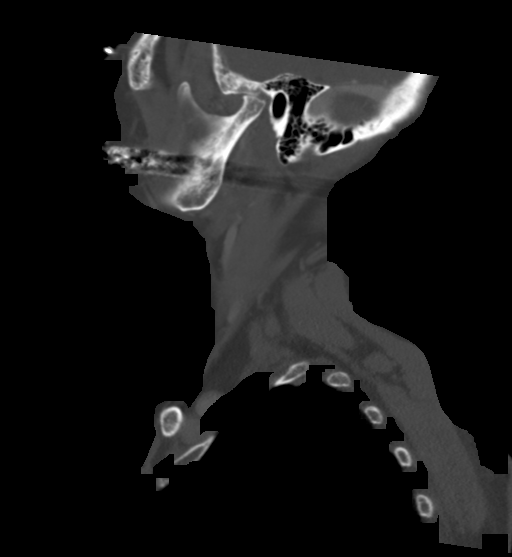
[im 42/101  bone]
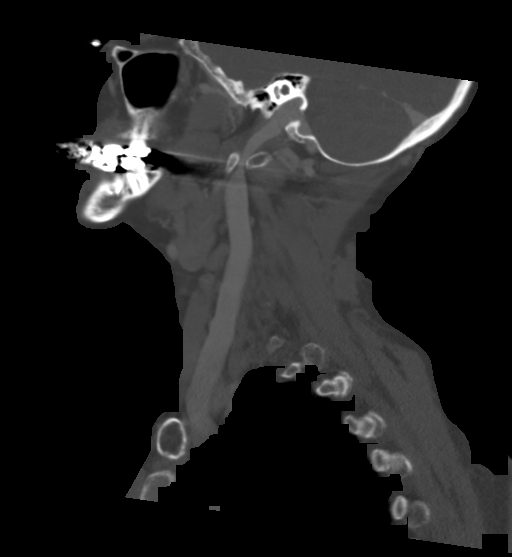
[im 51/101  soft-tissue]
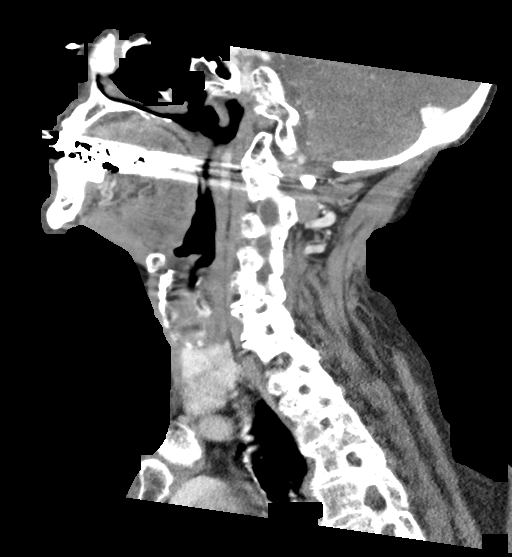
[im 51/101  bone]
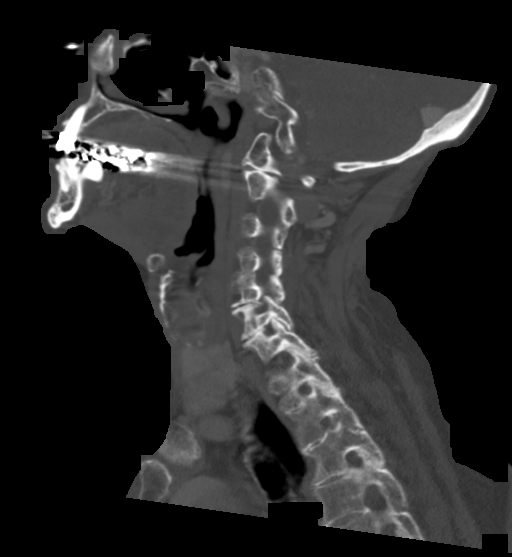
[im 59/101  bone]
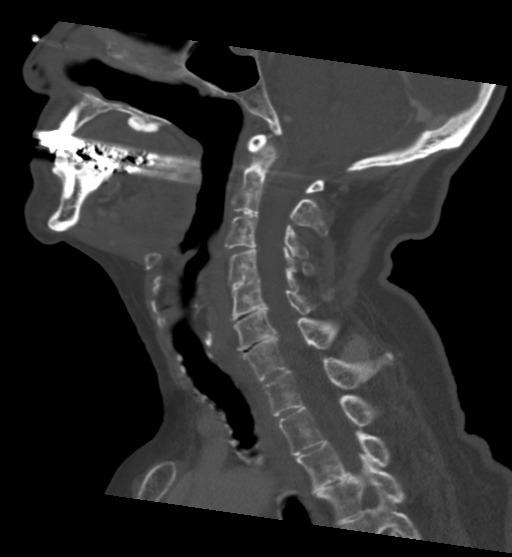
[im 67/101  bone]
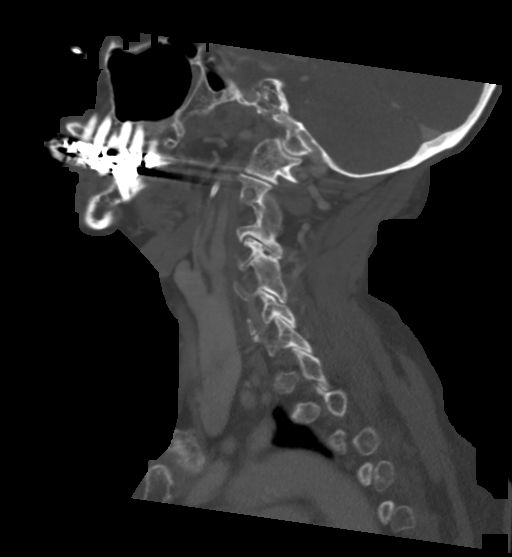

[14 of 33 positions shown; findings below may reference images not displayed]

FINDINGS: Pharynx and larynx: The patient's primary tongue mass is not clearly
visualized. At the level of the teeth there is remarkably dense
streak artifact with no visible anatomy underlying it. The root of
the tongue is distorted by bulky torus mandibularis.

Salivary glands: No inflammation, mass, or stone.

Thyroid: Negative. The right lobe is larger than the left without
discrete underlying nodule.

Lymph nodes: None asymmetrically enlarged or abnormal density.

Vascular: Unremarkable

Limited intracranial: Negative

Visualized orbits: Partial coverage is negative

Mastoids and visualized paranasal sinuses: Torus maxillarus,
palatini, and especially mandibularis.

Skeleton: Degenerative changes without acute or aggressive finding.
Cervical spine degeneration with levels of thoracic and cervical
ankylosis.

Upper chest: Negative
IMPRESSION: 1. The patient's tongue lesion is not visible. Artifact from dental
amalgam is definitely a limiting factor in visualizing the entire
tongue.
2. No worrisome nodes in the neck.

## 2022-04-06 DIAGNOSIS — M810 Age-related osteoporosis without current pathological fracture: Secondary | ICD-10-CM | POA: Diagnosis not present

## 2022-04-07 DIAGNOSIS — E7800 Pure hypercholesterolemia, unspecified: Secondary | ICD-10-CM | POA: Diagnosis not present

## 2022-04-07 DIAGNOSIS — M810 Age-related osteoporosis without current pathological fracture: Secondary | ICD-10-CM | POA: Diagnosis not present

## 2022-04-07 DIAGNOSIS — I10 Essential (primary) hypertension: Secondary | ICD-10-CM | POA: Diagnosis not present

## 2022-04-07 DIAGNOSIS — K219 Gastro-esophageal reflux disease without esophagitis: Secondary | ICD-10-CM | POA: Diagnosis not present

## 2022-04-07 DIAGNOSIS — M1712 Unilateral primary osteoarthritis, left knee: Secondary | ICD-10-CM | POA: Diagnosis not present

## 2022-04-15 NOTE — Discharge Instructions (Signed)
Instructions after Total Knee Replacement   Ouita Nish P. Jezabelle Chisolm, Jr., M.D.     Dept. of Orthopaedics & Sports Medicine  [facility]  [address]  [place], [place]  [address]  Phone: [phone]   Fax: [phone]    DIET: Drink plenty of non-alcoholic fluids. Resume your normal diet. Include foods high in fiber.  ACTIVITY:  You may use crutches or a walker with weight-bearing as tolerated, unless instructed otherwise. You may be weaned off of the walker or crutches by your Physical Therapist.  Do NOT place pillows under the knee. Anything placed under the knee could limit your ability to straighten the knee.   Continue doing gentle exercises. Exercising will reduce the pain and swelling, increase motion, and prevent muscle weakness.   Please continue to use the TED compression stockings for 6 weeks. You may remove the stockings at night, but should reapply them in the morning. Do not drive or operate any equipment until instructed.  WOUND CARE:  Continue to use the PolarCare or ice packs periodically to reduce pain and swelling. You may bathe or shower after the staples are removed at the first office visit following surgery.  MEDICATIONS: You may resume your regular medications. Please take the pain medication as prescribed on the medication. Do not take pain medication on an empty stomach. You have been given a prescription for a blood thinner (Lovenox or Coumadin). Please take the medication as instructed. (NOTE: After completing a 2 week course of Lovenox, take one Enteric-coated aspirin once a day. This along with elevation will help reduce the possibility of phlebitis in your operated leg.) Do not drive or drink alcoholic beverages when taking pain medications.  CALL THE OFFICE FOR: Temperature above 101 degrees Excessive bleeding or drainage on the dressing. Excessive swelling, coldness, or paleness of the toes. Persistent nausea and vomiting.  FOLLOW-UP:  You  should have an appointment to return to the office in 10-14 days after surgery. Arrangements have been made for continuation of Physical Therapy (either home therapy or outpatient therapy).   [facility] Department Directory         [email or web address]       [email or web address]          Cardiology  Appointments: [place] - [phone] [place] - [phone]  Endocrinology  Appointments: [place] - [phone] [place] - [phone]  Gastroenterology  Appointments: [place] - [phone] [place] - [phone]        General Surgery   Appointments: [employer] [place] [employer] - [phone]  Internal Medicine/Family Medicine  Appointments: [place] - [phone] [place] - [phone] [place] - [phone]  Metabolic and Weigh Loss Surgery  Appointments: [place] - [phone]        Neurology  Appointments: [place] - [phone] [place] - [phone]  Neurosurgery  Appointments: [place] - [phone]  Obstetrics & Gynecology  Appointments: [place] - [phone] [place] - [phone]        Pediatrics  Appointments: [place] - [phone] [place] - [phone]  Physiatry  Appointments: [place] -[phone]  Physical Therapy  Appointments: [place] - [phone] [place] - [phone]        Podiatry  Appointments: [place] - [phone] [place] - [phone]  Pulmonology  Appointments: [place] - [phone]  Rheumatology  Appointments: [place] - [phone]        [place] Location: [facility]  [address] , [place]  [address]  [place] Location: [facility] [address] [place], [place]  [address]  [place] Location: [facility] [address] [place], [place]  [address]    

## 2022-04-21 DIAGNOSIS — M810 Age-related osteoporosis without current pathological fracture: Secondary | ICD-10-CM | POA: Diagnosis not present

## 2022-04-21 DIAGNOSIS — Z79899 Other long term (current) drug therapy: Secondary | ICD-10-CM | POA: Diagnosis not present

## 2022-04-21 DIAGNOSIS — E7800 Pure hypercholesterolemia, unspecified: Secondary | ICD-10-CM | POA: Diagnosis not present

## 2022-04-21 DIAGNOSIS — C049 Malignant neoplasm of floor of mouth, unspecified: Secondary | ICD-10-CM | POA: Diagnosis not present

## 2022-04-21 DIAGNOSIS — I10 Essential (primary) hypertension: Secondary | ICD-10-CM | POA: Diagnosis not present

## 2022-04-21 DIAGNOSIS — M25562 Pain in left knee: Secondary | ICD-10-CM | POA: Diagnosis not present

## 2022-04-21 DIAGNOSIS — Z0001 Encounter for general adult medical examination with abnormal findings: Secondary | ICD-10-CM | POA: Diagnosis not present

## 2022-04-21 DIAGNOSIS — N390 Urinary tract infection, site not specified: Secondary | ICD-10-CM | POA: Diagnosis not present

## 2022-04-24 ENCOUNTER — Encounter
Admission: RE | Admit: 2022-04-24 | Discharge: 2022-04-24 | Disposition: A | Discharge status: Home / Self Care | Payer: Medicare Other | Source: Ambulatory Visit | Attending: Orthopedic Surgery | Admitting: Orthopedic Surgery

## 2022-04-24 ENCOUNTER — Other Ambulatory Visit: Payer: Self-pay

## 2022-04-24 VITALS — BP 149/72 | HR 58 | Resp 16 | Ht 63.5 in | Wt 140.4 lb

## 2022-04-24 DIAGNOSIS — Z01818 Encounter for other preprocedural examination: Secondary | ICD-10-CM | POA: Diagnosis not present

## 2022-04-24 DIAGNOSIS — E782 Mixed hyperlipidemia: Secondary | ICD-10-CM | POA: Diagnosis not present

## 2022-04-24 DIAGNOSIS — D696 Thrombocytopenia, unspecified: Secondary | ICD-10-CM | POA: Diagnosis not present

## 2022-04-24 DIAGNOSIS — D72819 Decreased white blood cell count, unspecified: Secondary | ICD-10-CM | POA: Diagnosis not present

## 2022-04-24 DIAGNOSIS — Z01812 Encounter for preprocedural laboratory examination: Secondary | ICD-10-CM

## 2022-04-24 DIAGNOSIS — E041 Nontoxic single thyroid nodule: Secondary | ICD-10-CM

## 2022-04-24 DIAGNOSIS — M1712 Unilateral primary osteoarthritis, left knee: Secondary | ICD-10-CM | POA: Diagnosis not present

## 2022-04-24 DIAGNOSIS — I480 Paroxysmal atrial fibrillation: Secondary | ICD-10-CM | POA: Diagnosis not present

## 2022-04-24 HISTORY — DX: Pneumonia, unspecified organism: J18.9

## 2022-04-24 HISTORY — DX: Malignant (primary) neoplasm, unspecified: C80.1

## 2022-04-24 LAB — URINALYSIS, ROUTINE W REFLEX MICROSCOPIC
Bilirubin Urine: NEGATIVE
Glucose, UA: NEGATIVE mg/dL
Hgb urine dipstick: NEGATIVE
Ketones, ur: NEGATIVE mg/dL
Leukocytes,Ua: NEGATIVE
Nitrite: NEGATIVE
Protein, ur: NEGATIVE mg/dL
Specific Gravity, Urine: 1.006 (ref 1.005–1.030)
pH: 7 (ref 5.0–8.0)

## 2022-04-24 LAB — COMPREHENSIVE METABOLIC PANEL
ALT: 20 U/L (ref 0–44)
AST: 24 U/L (ref 15–41)
Albumin: 4.1 g/dL (ref 3.5–5.0)
Alkaline Phosphatase: 85 U/L (ref 38–126)
Anion gap: 8 (ref 5–15)
BUN: 13 mg/dL (ref 8–23)
CO2: 27 mmol/L (ref 22–32)
Calcium: 9.5 mg/dL (ref 8.9–10.3)
Chloride: 96 mmol/L — ABNORMAL LOW (ref 98–111)
Creatinine, Ser: 0.64 mg/dL (ref 0.44–1.00)
GFR, Estimated: >60 mL/min (ref >60)
Glucose, Bld: 110 mg/dL — ABNORMAL HIGH (ref 70–99)
Potassium: 4.7 mmol/L (ref 3.5–5.1)
Sodium: 131 mmol/L — ABNORMAL LOW (ref 135–145)
Total Bilirubin: 0.5 mg/dL (ref 0.3–1.2)
Total Protein: 7.3 g/dL (ref 6.5–8.1)

## 2022-04-24 LAB — CBC
HCT: 36.5 % (ref 36.0–46.0)
Hemoglobin: 12.2 g/dL (ref 12.0–15.0)
MCH: 30.2 pg (ref 26.0–34.0)
MCHC: 33.4 g/dL (ref 30.0–36.0)
MCV: 90.3 fL (ref 80.0–100.0)
Platelets: 324 10*3/uL (ref 150–400)
RBC: 4.04 MIL/uL (ref 3.87–5.11)
RDW: 11.9 % (ref 11.5–15.5)
WBC: 4.6 10*3/uL (ref 4.0–10.5)
nRBC: 0 % (ref 0.0–0.2)

## 2022-04-24 LAB — C-REACTIVE PROTEIN: CRP: 0.5 mg/dL (ref <1.0)

## 2022-04-24 LAB — TYPE AND SCREEN
ABO/RH(D): AB POS
Antibody Screen: NEGATIVE

## 2022-04-24 LAB — SEDIMENTATION RATE: Sed Rate: 16 mm/hr (ref 0–30)

## 2022-04-24 LAB — SURGICAL PCR SCREEN
MRSA, PCR: NEGATIVE
Staphylococcus aureus: POSITIVE — AB

## 2022-04-24 NOTE — Patient Instructions (Addendum)
Your procedure is scheduled on: 05/08/22 - Friday Report to the Registration Desk on the 1st floor of the [facility]. To find out your arrival time, please call [phone] between 1PM - 3PM on: 05/07/22 - Thursday If your arrival time is 6:00 am, do not arrive prior to that time as the [place] entrance doors do not open until 6:00 am.  REMEMBER: Instructions that are not followed completely may result in serious medical risk, up to and including death; or upon the discretion of your surgeon and anesthesiologist your surgery may need to be rescheduled.  Do not eat food after midnight the night before surgery.  No gum chewing, lozengers or hard candies.  You may however, drink CLEAR liquids up to 2 hours before you are scheduled to arrive for your surgery. Do not drink anything within 2 hours of your scheduled arrival time.  Clear liquids include: - water  - apple juice without pulp - gatorade (not RED colors) - black coffee or tea (Do NOT add milk or creamers to the coffee or tea) Do NOT drink anything that is not on this list.   TAKE THESE MEDICATIONS THE MORNING OF SURGERY WITH A SIP OF WATER:  - esomeprazole (NEXIUM)  - metoprolol tartrate (LOPRESSOR)   One week prior to surgery: Stop beginning 05/01/22 , VOLTERAN GEL and Anti-inflammatories (NSAIDS) such as Advil, Aleve, Ibuprofen, Motrin, Naproxen, Naprosyn and Aspirin based products such as Excedrin, Goodys Powder, BC Powder.   Stop ANY OVER THE COUNTER supplements until after surgery beginning 05/01/22.  You may however, continue to take Tylenol if needed for pain up until the day of surgery.  No Alcohol for 24 hours before or after surgery.  No Smoking including e-cigarettes for 24 hours prior to surgery.  No chewable tobacco products for at least 6 hours prior to surgery.  No nicotine patches on the day of surgery.  Do not use any "recreational" drugs for at least a week prior to your surgery.  Please be  advised that the combination of cocaine and anesthesia may have negative outcomes, up to and including death. If you test positive for cocaine, your surgery will be cancelled.  On the morning of surgery brush your teeth with toothpaste and water, you may rinse your mouth with mouthwash if you wish. Do not swallow any toothpaste or mouthwash.  Use CHG Soap or wipes as directed on instruction sheet.  Do not wear jewelry, make-up, hairpins, clips or nail polish.  Do not wear lotions, powders, or perfumes.   Do not shave body from the neck down 48 hours prior to surgery just in case you cut yourself which could leave a site for infection.  Also, freshly shaved skin may become irritated if using the CHG soap.  Contact lenses, hearing aids and dentures may not be worn into surgery.  Do not bring valuables to the hospital. [facility] is not responsible for any missing/lost belongings or valuables.   Notify your doctor if there is any change in your medical condition (cold, fever, infection).  Wear comfortable clothing (specific to your surgery type) to the hospital.  After surgery, you can help prevent lung complications by doing breathing exercises.  Take deep breaths and cough every 1-2 hours. Your doctor may order a device called an Incentive Spirometer to help you take deep breaths. When coughing or sneezing, hold a pillow firmly against your incision with both hands. This is called "splinting." Doing this helps protect your incision. It also  decreases belly discomfort.  If you are being admitted to the hospital overnight, leave your suitcase in the car. After surgery it may be brought to your room.  If you are being discharged the day of surgery, you will not be allowed to drive home. You will need a responsible adult (18 years or older) to drive you home and stay with you that night.   If you are taking public transportation, you will need to have a responsible adult (18 years or  older) with you. Please confirm with your physician that it is acceptable to use public transportation.   Please call the [facility] at [phone] if you have any questions about these instructions.  Surgery Visitation Policy:  Patients undergoing a surgery or procedure may have two family members or support persons with them as long as the person is not COVID-19 positive or experiencing its symptoms.   Inpatient Visitation:    Visiting hours are 7 a.m. to 8 p.m. Up to four visitors are allowed at one time in a patient room. The visitors may rotate out with other people during the day. One designated support person (adult) may remain overnight.  Due to an increase in RSV and influenza rates and associated hospitalizations, children ages 57 and under will not be able to visit patients in [facility]. Masks continue to be strongly recommended.

## 2022-04-29 ENCOUNTER — Other Ambulatory Visit: Payer: Medicare Other

## 2022-05-03 NOTE — H&P (Signed)
ORTHOPAEDIC HISTORY & PHYSICAL Gwenlyn Fudge, [place] - 04/24/2022 10:30 AM EST Formatting of this note is different from the original. [facility] Chief Complaint:  Chief Complaint Patient presents with Knee Pain H & P LEFT KNEE  History of Present Illness:  Erika [name] is a 77 y.o. female that presents to clinic today for her preoperative history and evaluation. Patient presents unaccompanied. The patient is scheduled to undergo a left total knee arthroplasty on 05/08/22 by Dr. Marry Guan. Her pain began 8 months ago. The pain is located along the lateral and anterior aspects of the knee. She describes her pain as worse with weightbearing and going up and down stairs. She reports associated swelling with significant giving way of the knee. She denies associated numbness or tingling, denies locking.  The patient's symptoms have progressed to the point that they decrease her quality of life. The patient has previously undergone conservative treatment including NSAIDS and injections to the knee without adequate control of her symptoms.  Patient denies history of lumbar surgery, DVT, or significant cardiac history. Does have history of chronic UTIs for which she takes Macrobid daily.  Patient will have her son and daughter-in-law to assist her post-operatively.  Past Medical, Surgical, Family, Social History, Allergies, Medications:  Past Medical History: Past Medical History: Diagnosis Date Arthritis Cataracts, bilateral History of shingles Hypertension Seasonal allergies  Past Surgical History: Past Surgical History: Procedure Laterality Date Total knee arthroplasty using computer assisted navigation Right 11/07/2008 Dr Marry Guan Herniorrhaphy X 2 Left arm bone spur and cyst removed Left Tubal ligationwith incidental appendectomy  Current Medications: Current Outpatient Medications Medication Sig Dispense Refill acetaminophen  (TYLENOL) 650 MG ER tablet Take 500 mg by mouth every 8 (eight) hours as needed for Pain amLODIPine (NORVASC) 5 MG tablet Take 5 mg by mouth once daily. amoxicillin (AMOXIL) 500 MG capsule TAKE 4 CAPSULES BY MOUTH 30 MINUTES TO AN HOUR BEFORE PROCEDURE. 4 capsule 10 Bacillus subtilis-inulin 1.5 billion cell-1 gram Chew Take 1 capsule by mouth once daily calcium carbonate-vitamin D3 (CALCIUM 500 + D) 500 mg(1,'250mg'$ ) -400 unit tablet Take 1 tablet by mouth 2 (two) times daily with meals carboxymethylcellulose sodium Drop Apply 1.4 % to eye once as needed Compound Medication Take by mouth once daily Fiber Adult Gummies: 2 GM as directed cranberry extract 250 mg Cap Take 250 mg by mouth once daily denosumab (PROLIA) 60 mg/mL inj syringe Inject subcutaneously every 6 (six) months diclofenac (VOLTAREN) 1 % topical gel Apply topically continuously as needed esomeprazole (NEXIUM) 40 MG DR capsule Take 40 mg by mouth once daily fluticasone (FLONASE) 50 mcg/actuation nasal spray Place 2 sprays into both nostrils once daily as needed for Rhinitis. fluticasone propionate (CUTIVATE) 0.05 % cream Apply topically once daily ibuprofen (MOTRIN) 200 MG tablet Take 400 mg by mouth every 8 (eight) hours as needed for Pain lidocaine HCL 4 % lqro Apply topically once daily loratadine (CLARITIN) 10 mg tablet Take 10 mg by mouth once daily losartan (COZAAR) 100 MG tablet Take 100 mg by mouth once daily metoprolol tartrate (LOPRESSOR) 25 MG tablet Take 25 mg by mouth 2 (two) times daily with meals multivit-min/iron/folic/hrb186 (HAIR, SKIN AND NAILS ADVANCED ORAL) Take by mouth once daily MULTIVITAMIN ORAL Take by mouth once daily nitrofurantoin (MACRODANTIN) 50 MG capsule Take 50 mg by mouth once daily simvastatin (ZOCOR) 20 MG tablet Take 20 mg by mouth at bedtime vit C/E/Zn/coppr/lutein/zeaxan (PRESERVISION AREDS-2 ORAL) Take by mouth once daily losartan (COZAAR) 50  MG tablet Take 50 mg by mouth once daily.  No  current facility-administered medications for this visit.  Allergies: Allergies Allergen Reactions Hydrochlorothiazide Other (See Comments) Decreases sodium causing brain swelling  "low sodium"  Brain swelling Unknown "low sodium" Atorvastatin Other (See Comments) cramps Codeine Nausea Strawberry Itching  Social History: Social History  Socioeconomic History Marital status: Divorced Number of children: 3 Years of education: 12+ Highest education level: High school graduate Occupational History Occupation: RetiredPsychiatric nurse Work Tobacco Use Smoking status: Never Smokeless tobacco: Never Scientist, research (physical sciences) Use: Never used Substance and Sexual Activity Alcohol use: No Drug use: No Sexual activity: Defer  Family History: Family History Problem Relation Age of Onset Arthritis Mother High blood pressure (Hypertension) Mother Heart failure Mother High blood pressure (Hypertension) Father Emphysema Father Stroke Father  Review of Systems:  A 10+ ROS was performed, reviewed, and the pertinent orthopaedic findings are documented in the HPI.  Physical Examination:  BP (!) 140/84 (BP Location: Left upper arm, Patient Position: Sitting, BP Cuff Size: Adult)  Ht 160 cm ('5\' 3"'$ )  Wt 63.4 kg (139 lb 12.8 oz)  BMI 24.76 kg/m  Patient is a well-developed, well-nourished female in no acute distress. Patient has normal mood and affect. Patient is alert and oriented to person, place, and time.  HEENT: Atraumatic, normocephalic. Pupils equal and reactive to light. Extraocular motion intact. Noninjected sclera.  Cardiovascular: Regular rate and rhythm, with no murmurs, rubs, or gallops. Distal pulses palpable.  Respiratory: Lungs clear to auscultation bilaterally.  Left Knee: Soft tissue swelling: minimal Effusion: none Erythema: none Crepitance: mild Tenderness: lateral, anterior Alignment: normal Mediolateral laxity: stable Posterior sag: negative Patellar  tracking: Good tracking without evidence of subluxation or tilt Atrophy: Generalized quadriceps atrophy. Quadriceps tone was fair. Range of motion: 0/0/84 degrees  Able to plantarflex and dorsiflex the left ankle. Able to flex and extend the toes.  Sensation intact over the saphenous, lateral sural cutaneous, superficial fibular, and deep fibular nerve distributions.  Tests Performed/Reviewed: X-rays  Previous x-rays were reviewed and show loss of medial and lateral compartment joint space with osteophyte formation and subchondral sclerosis.  Impression:  ICD-10-CM 1. Primary osteoarthritis of left knee M17.12  Plan:  The patient has end-stage degenerative changes of the left knee. It was explained to the patient that the condition is progressive in nature. Having failed conservative treatment, the patient has elected to proceed with a total joint arthroplasty. The patient will undergo a total joint arthroplasty with Dr. Marry Guan. The risks of surgery, including blood clot and infection, were discussed with the patient. Measures to reduce these risks, including the use of anticoagulation, perioperative antibiotics, and early ambulation were discussed. The importance of postoperative physical therapy was discussed with the patient. The patient elects to proceed with surgery. The patient is instructed to stop all blood thinners prior to surgery. The patient is instructed to call the hospital the day before surgery to learn of the proper arrival time.  Contact our office with any questions or concerns. Follow up as indicated, or sooner should any new problems arise, if conditions worsen, or if they are otherwise concerned.  Gwenlyn Fudge, [facility] [place], [place] [address] Phone: [phone]  This note was generated in part with voice recognition software and I apologize for any typographical errors that were not  detected and corrected.  Electronically signed by Gwenlyn Fudge, PA at 04/24/2022 1:32 PM EST

## 2022-05-08 ENCOUNTER — Other Ambulatory Visit: Payer: Self-pay

## 2022-05-08 ENCOUNTER — Encounter: Payer: Self-pay | Admitting: Orthopedic Surgery

## 2022-05-08 ENCOUNTER — Ambulatory Visit: Payer: Medicare Other | Admitting: Anesthesiology

## 2022-05-08 ENCOUNTER — Encounter
Admission: RE | Disposition: A | Discharge status: Home / Self Care | Payer: Self-pay | Source: Home / Self Care | Attending: Orthopedic Surgery

## 2022-05-08 ENCOUNTER — Observation Stay
Admission: RE | Admit: 2022-05-08 | Discharge: 2022-05-09 | Disposition: A | Discharge status: Home / Self Care | Payer: Medicare Other | Attending: Orthopedic Surgery | Admitting: Orthopedic Surgery

## 2022-05-08 ENCOUNTER — Observation Stay: Payer: Medicare Other

## 2022-05-08 DIAGNOSIS — Z79899 Other long term (current) drug therapy: Secondary | ICD-10-CM | POA: Diagnosis not present

## 2022-05-08 DIAGNOSIS — Z85819 Personal history of malignant neoplasm of unspecified site of lip, oral cavity, and pharynx: Secondary | ICD-10-CM | POA: Diagnosis not present

## 2022-05-08 DIAGNOSIS — M1712 Unilateral primary osteoarthritis, left knee: Principal | ICD-10-CM

## 2022-05-08 DIAGNOSIS — M25462 Effusion, left knee: Secondary | ICD-10-CM | POA: Diagnosis not present

## 2022-05-08 DIAGNOSIS — Z96659 Presence of unspecified artificial knee joint: Secondary | ICD-10-CM

## 2022-05-08 DIAGNOSIS — Z96652 Presence of left artificial knee joint: Secondary | ICD-10-CM | POA: Diagnosis not present

## 2022-05-08 DIAGNOSIS — Z471 Aftercare following joint replacement surgery: Secondary | ICD-10-CM | POA: Diagnosis not present

## 2022-05-08 DIAGNOSIS — I480 Paroxysmal atrial fibrillation: Secondary | ICD-10-CM | POA: Diagnosis not present

## 2022-05-08 DIAGNOSIS — I10 Essential (primary) hypertension: Secondary | ICD-10-CM | POA: Insufficient documentation

## 2022-05-08 DIAGNOSIS — Z96651 Presence of right artificial knee joint: Secondary | ICD-10-CM | POA: Diagnosis not present

## 2022-05-08 DIAGNOSIS — E041 Nontoxic single thyroid nodule: Secondary | ICD-10-CM

## 2022-05-08 DIAGNOSIS — D72819 Decreased white blood cell count, unspecified: Secondary | ICD-10-CM

## 2022-05-08 DIAGNOSIS — E7800 Pure hypercholesterolemia, unspecified: Secondary | ICD-10-CM | POA: Diagnosis not present

## 2022-05-08 DIAGNOSIS — Z7952 Long term (current) use of systemic steroids: Secondary | ICD-10-CM | POA: Insufficient documentation

## 2022-05-08 DIAGNOSIS — E782 Mixed hyperlipidemia: Secondary | ICD-10-CM

## 2022-05-08 DIAGNOSIS — D696 Thrombocytopenia, unspecified: Secondary | ICD-10-CM

## 2022-05-08 HISTORY — PX: KNEE ARTHROPLASTY: SHX992

## 2022-05-08 LAB — ABO/RH: ABO/RH(D): AB POS

## 2022-05-08 SURGERY — ARTHROPLASTY, KNEE, TOTAL, USING IMAGELESS COMPUTER-ASSISTED NAVIGATION
Anesthesia: Spinal | Site: Knee | Laterality: Left

## 2022-05-08 MED ORDER — PROPOFOL 500 MG/50ML IV EMUL
INTRAVENOUS | Status: DC | PRN
  Administered 2022-05-08: 100 ug/kg/min via INTRAVENOUS

## 2022-05-08 MED ORDER — FENTANYL CITRATE (PF) 100 MCG/2ML IJ SOLN: 25.0000 ug | INTRAMUSCULAR | Status: DC | PRN

## 2022-05-08 MED ORDER — LORATADINE 10 MG PO TABS
10.0000 mg | ORAL_TABLET | Freq: Every day | ORAL | Status: DC
  Administered 2022-05-08 – 2022-05-09 (×2): 10 mg via ORAL
  Filled 2022-05-08 (×2): qty 1

## 2022-05-08 MED ORDER — PHENOL 1.4 % MT LIQD: 1.0000 | OROMUCOSAL | Status: DC | PRN

## 2022-05-08 MED ORDER — ACETAMINOPHEN 10 MG/ML IV SOLN
INTRAVENOUS | Status: DC | PRN
  Administered 2022-05-08: 1000 mg via INTRAVENOUS

## 2022-05-08 MED ORDER — CEFAZOLIN SODIUM-DEXTROSE 2-4 GM/100ML-% IV SOLN
INTRAVENOUS | Status: AC
  Administered 2022-05-08: 2 g via INTRAVENOUS
  Filled 2022-05-08: qty 100

## 2022-05-08 MED ORDER — CALCIUM POLYCARBOPHIL 625 MG PO TABS
625.0000 mg | ORAL_TABLET | Freq: Every day | ORAL | Status: DC
  Administered 2022-05-09: 625 mg via ORAL
  Filled 2022-05-08: qty 1

## 2022-05-08 MED ORDER — CELECOXIB 200 MG PO CAPS
200.0000 mg | ORAL_CAPSULE | Freq: Two times a day (BID) | ORAL | Status: DC
  Administered 2022-05-08 – 2022-05-09 (×2): 200 mg via ORAL
  Filled 2022-05-08 (×2): qty 1

## 2022-05-08 MED ORDER — SODIUM CHLORIDE 0.9 % IV SOLN: INTRAVENOUS | Status: DC

## 2022-05-08 MED ORDER — GABAPENTIN 300 MG PO CAPS: 300.0000 mg | ORAL_CAPSULE | Freq: Once | ORAL | Status: AC

## 2022-05-08 MED ORDER — OXYCODONE HCL 5 MG PO TABS: 10.0000 mg | ORAL_TABLET | ORAL | Status: DC | PRN

## 2022-05-08 MED ORDER — PROPOFOL 10 MG/ML IV BOLUS
INTRAVENOUS | Status: DC | PRN
  Administered 2022-05-08: 20 mg via INTRAVENOUS
  Administered 2022-05-08: 30 mg via INTRAVENOUS

## 2022-05-08 MED ORDER — DIPHENHYDRAMINE HCL 12.5 MG/5ML PO ELIX: 12.5000 mg | ORAL_SOLUTION | ORAL | Status: DC | PRN

## 2022-05-08 MED ORDER — TRANEXAMIC ACID-NACL 1000-0.7 MG/100ML-% IV SOLN
1000.0000 mg | INTRAVENOUS | Status: AC
  Administered 2022-05-08: 1000 mg via INTRAVENOUS

## 2022-05-08 MED ORDER — ONDANSETRON HCL 4 MG PO TABS: 4.0000 mg | ORAL_TABLET | Freq: Four times a day (QID) | ORAL | Status: DC | PRN

## 2022-05-08 MED ORDER — PHENYLEPHRINE HCL-NACL 20-0.9 MG/250ML-% IV SOLN
INTRAVENOUS | Status: AC
  Filled 2022-05-08: qty 250

## 2022-05-08 MED ORDER — TRAMADOL HCL 50 MG PO TABS: 50.0000 mg | ORAL_TABLET | ORAL | Status: DC | PRN

## 2022-05-08 MED ORDER — CEFAZOLIN SODIUM-DEXTROSE 2-4 GM/100ML-% IV SOLN
2.0000 g | INTRAVENOUS | Status: AC
  Administered 2022-05-08: 2 g via INTRAVENOUS

## 2022-05-08 MED ORDER — OXYCODONE HCL 5 MG PO TABS
5.0000 mg | ORAL_TABLET | ORAL | Status: DC | PRN
  Administered 2022-05-08 – 2022-05-09 (×2): 5 mg via ORAL
  Filled 2022-05-08 (×2): qty 1

## 2022-05-08 MED ORDER — CELECOXIB 200 MG PO CAPS: 400.0000 mg | ORAL_CAPSULE | Freq: Once | ORAL | Status: AC

## 2022-05-08 MED ORDER — CEFAZOLIN SODIUM-DEXTROSE 2-4 GM/100ML-% IV SOLN
INTRAVENOUS | Status: AC
  Filled 2022-05-08: qty 100

## 2022-05-08 MED ORDER — TRANEXAMIC ACID-NACL 1000-0.7 MG/100ML-% IV SOLN
INTRAVENOUS | Status: AC
  Administered 2022-05-08: 1000 mg via INTRAVENOUS
  Filled 2022-05-08: qty 100

## 2022-05-08 MED ORDER — PROPOFOL 1000 MG/100ML IV EMUL
INTRAVENOUS | Status: AC
  Filled 2022-05-08: qty 100

## 2022-05-08 MED ORDER — BUPIVACAINE HCL (PF) 0.5 % IJ SOLN
INTRAMUSCULAR | Status: AC
  Filled 2022-05-08: qty 10

## 2022-05-08 MED ORDER — PHENYLEPHRINE HCL-NACL 20-0.9 MG/250ML-% IV SOLN
INTRAVENOUS | Status: DC | PRN
  Administered 2022-05-08: 25 ug/min via INTRAVENOUS

## 2022-05-08 MED ORDER — GLYCOPYRROLATE 0.2 MG/ML IJ SOLN
INTRAMUSCULAR | Status: AC
  Filled 2022-05-08: qty 1

## 2022-05-08 MED ORDER — CHLORHEXIDINE GLUCONATE 4 % EX LIQD
60.0000 mL | Freq: Once | CUTANEOUS | Status: AC
  Administered 2022-05-08: 4 via TOPICAL

## 2022-05-08 MED ORDER — CELECOXIB 200 MG PO CAPS
ORAL_CAPSULE | ORAL | Status: AC
  Administered 2022-05-08: 400 mg via ORAL
  Filled 2022-05-08: qty 2

## 2022-05-08 MED ORDER — KETAMINE HCL 10 MG/ML IJ SOLN
INTRAMUSCULAR | Status: DC | PRN
  Administered 2022-05-08: 15 mg via INTRAVENOUS
  Administered 2022-05-08: 10 mg via INTRAVENOUS

## 2022-05-08 MED ORDER — MENTHOL 3 MG MT LOZG: 1.0000 | LOZENGE | OROMUCOSAL | Status: DC | PRN

## 2022-05-08 MED ORDER — OXYCODONE HCL 5 MG/5ML PO SOLN: 5.0000 mg | Freq: Once | ORAL | Status: AC | PRN

## 2022-05-08 MED ORDER — STERILE WATER FOR IRRIGATION IR SOLN
Status: DC | PRN
  Administered 2022-05-08: 1000 mL

## 2022-05-08 MED ORDER — FERROUS SULFATE 325 (65 FE) MG PO TABS
325.0000 mg | ORAL_TABLET | Freq: Two times a day (BID) | ORAL | Status: DC
  Administered 2022-05-08 – 2022-05-09 (×2): 325 mg via ORAL
  Filled 2022-05-08 (×2): qty 1

## 2022-05-08 MED ORDER — SENNOSIDES-DOCUSATE SODIUM 8.6-50 MG PO TABS
1.0000 | ORAL_TABLET | Freq: Two times a day (BID) | ORAL | Status: DC
  Administered 2022-05-08 – 2022-05-09 (×2): 1 via ORAL
  Filled 2022-05-08 (×2): qty 1

## 2022-05-08 MED ORDER — SODIUM CHLORIDE (PF) 0.9 % IJ SOLN
INTRAMUSCULAR | Status: DC | PRN
  Administered 2022-05-08: 120 mL via INTRAMUSCULAR

## 2022-05-08 MED ORDER — AMLODIPINE BESYLATE 5 MG PO TABS
5.0000 mg | ORAL_TABLET | Freq: Every day | ORAL | Status: DC
  Administered 2022-05-08: 5 mg via ORAL
  Filled 2022-05-08: qty 1

## 2022-05-08 MED ORDER — LOSARTAN POTASSIUM 50 MG PO TABS
100.0000 mg | ORAL_TABLET | Freq: Every day | ORAL | Status: DC
  Administered 2022-05-09: 100 mg via ORAL
  Filled 2022-05-08 (×2): qty 2

## 2022-05-08 MED ORDER — ACETAMINOPHEN 325 MG PO TABS: 325.0000 mg | ORAL_TABLET | Freq: Four times a day (QID) | ORAL | Status: DC | PRN

## 2022-05-08 MED ORDER — BISACODYL 10 MG RE SUPP: 10.0000 mg | Freq: Every day | RECTAL | Status: DC | PRN

## 2022-05-08 MED ORDER — GABAPENTIN 300 MG PO CAPS
ORAL_CAPSULE | ORAL | Status: AC
  Administered 2022-05-08: 300 mg via ORAL
  Filled 2022-05-08: qty 1

## 2022-05-08 MED ORDER — FLUTICASONE PROPIONATE 50 MCG/ACT NA SUSP
2.0000 | Freq: Every day | NASAL | Status: DC
  Administered 2022-05-09: 2 via NASAL
  Filled 2022-05-08: qty 16

## 2022-05-08 MED ORDER — POLYVINYL ALCOHOL 1.4 % OP SOLN: 1.0000 [drp] | Freq: Every day | OPHTHALMIC | Status: DC | PRN

## 2022-05-08 MED ORDER — ONDANSETRON HCL 4 MG/2ML IJ SOLN: 4.0000 mg | Freq: Four times a day (QID) | INTRAMUSCULAR | Status: DC | PRN

## 2022-05-08 MED ORDER — METOCLOPRAMIDE HCL 5 MG PO TABS
10.0000 mg | ORAL_TABLET | Freq: Three times a day (TID) | ORAL | Status: DC
  Administered 2022-05-08 – 2022-05-09 (×3): 10 mg via ORAL
  Filled 2022-05-08 (×3): qty 2

## 2022-05-08 MED ORDER — GLYCOPYRROLATE 0.2 MG/ML IJ SOLN
INTRAMUSCULAR | Status: DC | PRN
  Administered 2022-05-08 (×2): .1 mg via INTRAVENOUS

## 2022-05-08 MED ORDER — KETAMINE HCL 50 MG/5ML IJ SOSY
PREFILLED_SYRINGE | INTRAMUSCULAR | Status: AC
  Filled 2022-05-08: qty 5

## 2022-05-08 MED ORDER — PANTOPRAZOLE SODIUM 40 MG PO TBEC
40.0000 mg | DELAYED_RELEASE_TABLET | Freq: Two times a day (BID) | ORAL | Status: DC
  Administered 2022-05-08 – 2022-05-09 (×3): 40 mg via ORAL
  Filled 2022-05-08 (×3): qty 1

## 2022-05-08 MED ORDER — MAGNESIUM HYDROXIDE 400 MG/5ML PO SUSP
30.0000 mL | Freq: Every day | ORAL | Status: DC
  Administered 2022-05-08 – 2022-05-09 (×2): 30 mL via ORAL
  Filled 2022-05-08 (×2): qty 30

## 2022-05-08 MED ORDER — ALUM & MAG HYDROXIDE-SIMETH 200-200-20 MG/5ML PO SUSP: 30.0000 mL | ORAL | Status: DC | PRN

## 2022-05-08 MED ORDER — ORAL CARE MOUTH RINSE: 15.0000 mL | Freq: Once | OROMUCOSAL | Status: AC

## 2022-05-08 MED ORDER — BUPIVACAINE HCL (PF) 0.25 % IJ SOLN
INTRAMUSCULAR | Status: AC
  Filled 2022-05-08: qty 60

## 2022-05-08 MED ORDER — ACETAMINOPHEN 10 MG/ML IV SOLN
1000.0000 mg | Freq: Four times a day (QID) | INTRAVENOUS | Status: DC
  Administered 2022-05-08 (×2): 1000 mg via INTRAVENOUS
  Filled 2022-05-08 (×2): qty 100

## 2022-05-08 MED ORDER — CEFAZOLIN SODIUM-DEXTROSE 2-4 GM/100ML-% IV SOLN
2.0000 g | Freq: Four times a day (QID) | INTRAVENOUS | Status: AC
  Administered 2022-05-08: 2 g via INTRAVENOUS
  Filled 2022-05-08: qty 100

## 2022-05-08 MED ORDER — SIMVASTATIN 20 MG PO TABS
20.0000 mg | ORAL_TABLET | Freq: Every day | ORAL | Status: DC
  Administered 2022-05-08: 20 mg via ORAL
  Filled 2022-05-08: qty 1

## 2022-05-08 MED ORDER — CHLORHEXIDINE GLUCONATE 0.12 % MT SOLN
OROMUCOSAL | Status: AC
  Administered 2022-05-08: 15 mL via OROMUCOSAL
  Filled 2022-05-08: qty 15

## 2022-05-08 MED ORDER — TRANEXAMIC ACID-NACL 1000-0.7 MG/100ML-% IV SOLN: 1000.0000 mg | Freq: Once | INTRAVENOUS | Status: AC

## 2022-05-08 MED ORDER — OXYCODONE HCL 5 MG PO TABS
ORAL_TABLET | ORAL | Status: AC
  Filled 2022-05-08: qty 1

## 2022-05-08 MED ORDER — TRANEXAMIC ACID-NACL 1000-0.7 MG/100ML-% IV SOLN
INTRAVENOUS | Status: AC
  Filled 2022-05-08: qty 100

## 2022-05-08 MED ORDER — ENOXAPARIN SODIUM 30 MG/0.3ML IJ SOSY
30.0000 mg | PREFILLED_SYRINGE | Freq: Two times a day (BID) | INTRAMUSCULAR | Status: DC
  Administered 2022-05-09: 30 mg via SUBCUTANEOUS
  Filled 2022-05-08: qty 0.3

## 2022-05-08 MED ORDER — SODIUM CHLORIDE 0.9 % IR SOLN
Status: DC | PRN
  Administered 2022-05-08: 3000 mL

## 2022-05-08 MED ORDER — HYDROMORPHONE HCL 1 MG/ML IJ SOLN: 0.5000 mg | INTRAMUSCULAR | Status: DC | PRN

## 2022-05-08 MED ORDER — BUPIVACAINE LIPOSOME 1.3 % IJ SUSP
INTRAMUSCULAR | Status: AC
  Filled 2022-05-08: qty 20

## 2022-05-08 MED ORDER — FLEET ENEMA 7-19 GM/118ML RE ENEM: 1.0000 | ENEMA | Freq: Once | RECTAL | Status: DC | PRN

## 2022-05-08 MED ORDER — CHLORHEXIDINE GLUCONATE 0.12 % MT SOLN: 15.0000 mL | Freq: Once | OROMUCOSAL | Status: AC

## 2022-05-08 MED ORDER — SODIUM CHLORIDE FLUSH 0.9 % IV SOLN
INTRAVENOUS | Status: AC
  Filled 2022-05-08: qty 40

## 2022-05-08 MED ORDER — BUPIVACAINE HCL (PF) 0.5 % IJ SOLN
INTRAMUSCULAR | Status: DC | PRN
  Administered 2022-05-08: 3 mL via INTRATHECAL

## 2022-05-08 MED ORDER — EPHEDRINE SULFATE (PRESSORS) 50 MG/ML IJ SOLN
INTRAMUSCULAR | Status: DC | PRN
  Administered 2022-05-08: 7.5 mg via INTRAVENOUS

## 2022-05-08 MED ORDER — OXYCODONE HCL 5 MG PO TABS
5.0000 mg | ORAL_TABLET | Freq: Once | ORAL | Status: AC | PRN
  Administered 2022-05-08: 5 mg via ORAL

## 2022-05-08 MED ORDER — SURGIPHOR WOUND IRRIGATION SYSTEM - OPTIME
TOPICAL | Status: DC | PRN
  Administered 2022-05-08: 450 mL

## 2022-05-08 MED ORDER — ENSURE PRE-SURGERY PO LIQD
296.0000 mL | Freq: Once | ORAL | Status: DC
  Filled 2022-05-08: qty 296

## 2022-05-08 MED ORDER — RISAQUAD PO CAPS
1.0000 | ORAL_CAPSULE | Freq: Every day | ORAL | Status: DC
  Administered 2022-05-09: 1 via ORAL
  Filled 2022-05-08: qty 1

## 2022-05-08 MED ORDER — DEXAMETHASONE SODIUM PHOSPHATE 10 MG/ML IJ SOLN
INTRAMUSCULAR | Status: AC
  Administered 2022-05-08: 8 mg via INTRAVENOUS
  Filled 2022-05-08: qty 1

## 2022-05-08 MED ORDER — NITROFURANTOIN MACROCRYSTAL 50 MG PO CAPS
50.0000 mg | ORAL_CAPSULE | Freq: Every day | ORAL | Status: DC
  Administered 2022-05-08: 50 mg via ORAL
  Filled 2022-05-08: qty 1

## 2022-05-08 MED ORDER — LACTATED RINGERS IV SOLN: INTRAVENOUS | Status: DC

## 2022-05-08 MED ORDER — DEXAMETHASONE SODIUM PHOSPHATE 10 MG/ML IJ SOLN: 8.0000 mg | Freq: Once | INTRAMUSCULAR | Status: AC

## 2022-05-08 SURGICAL SUPPLY — 77 items
ATTUNE PSFEM LTSZ4 NARCEM KNEE (Femur) IMPLANT
ATTUNE PSRP INSR SZ4 8 KNEE (Insert) IMPLANT
BASEPLATE TIBIAL ROTATING SZ 4 (Knees) IMPLANT
BATTERY INSTRU NAVIGATION (MISCELLANEOUS) ×4 IMPLANT
BLADE CLIPPER SURG (BLADE) IMPLANT
BLADE SAW 70X12.5 (BLADE) ×1 IMPLANT
BLADE SAW 90X13X1.19 OSCILLAT (BLADE) ×1 IMPLANT
BLADE SAW 90X25X1.19 OSCILLAT (BLADE) ×1 IMPLANT
BONE CEMENT GENTAMICIN (Cement) IMPLANT
BSPLAT TIB 4 CMNT ROT PLAT STR (Knees) ×1 IMPLANT
BTRY SRG DRVR LF (MISCELLANEOUS) ×4
CEMENT HV SMART SET (Cement) IMPLANT
COOLER POLAR GLACIER W/PUMP (MISCELLANEOUS) ×1 IMPLANT
CUFF TOURN SGL QUICK 24 (TOURNIQUET CUFF)
CUFF TOURN SGL QUICK 34 (TOURNIQUET CUFF)
CUFF TRNQT CYL 24X4X16.5-23 (TOURNIQUET CUFF) IMPLANT
CUFF TRNQT CYL 34X4.125X (TOURNIQUET CUFF) IMPLANT
DRAPE 3/4 80X56 (DRAPES) ×1 IMPLANT
DRAPE INCISE IOBAN 66X45 STRL (DRAPES) IMPLANT
DRSG MEPILEX SACRM 8.7X9.8 (GAUZE/BANDAGES/DRESSINGS) ×1 IMPLANT
DRSG NON-ADHERENT DERMACEA 3X4 (GAUZE/BANDAGES/DRESSINGS) ×1 IMPLANT
DRSG OPSITE POSTOP 4X14 (GAUZE/BANDAGES/DRESSINGS) ×1 IMPLANT
DRSG TEGADERM 4X4.75 (GAUZE/BANDAGES/DRESSINGS) ×1 IMPLANT
DURAPREP 26ML APPLICATOR (WOUND CARE) ×2 IMPLANT
ELECT CAUTERY BLADE 6.4 (BLADE) ×1 IMPLANT
ELECT REM PT RETURN 9FT ADLT (ELECTROSURGICAL) ×1
ELECTRODE REM PT RTRN 9FT ADLT (ELECTROSURGICAL) ×1 IMPLANT
EX-PIN ORTHOLOCK NAV 4X150 (PIN) ×2 IMPLANT
GLOVE BIOGEL M STRL SZ7.5 (GLOVE) ×2 IMPLANT
GLOVE BIOGEL PI IND STRL 7.5 (GLOVE) ×1 IMPLANT
GLOVE PI ORTHO PRO STRL 7.5 (GLOVE) ×2 IMPLANT
GLOVE SRG 8 PF TXTR STRL LF DI (GLOVE) ×1 IMPLANT
GLOVE SURG UNDER POLY LF SZ7.5 (GLOVE) ×1 IMPLANT
GLOVE SURG UNDER POLY LF SZ8 (GLOVE) ×1
GOWN STRL REUS W/ TWL LRG LVL3 (GOWN DISPOSABLE) ×1 IMPLANT
GOWN STRL REUS W/ TWL XL LVL3 (GOWN DISPOSABLE) ×1 IMPLANT
GOWN STRL REUS W/TWL LRG LVL3 (GOWN DISPOSABLE) ×1
GOWN STRL REUS W/TWL XL LVL3 (GOWN DISPOSABLE) ×1
GOWN TOGA ZIPPER T7+ PEEL AWAY (MISCELLANEOUS) ×2 IMPLANT
HEMOVAC 400CC 10FR (MISCELLANEOUS) ×1 IMPLANT
HOLDER FOLEY CATH W/STRAP (MISCELLANEOUS) ×1 IMPLANT
IV NS IRRIG 3000ML ARTHROMATIC (IV SOLUTION) ×1 IMPLANT
KIT TURNOVER KIT A (KITS) ×1 IMPLANT
KNIFE SCULPS 14X20 (INSTRUMENTS) ×1 IMPLANT
MANIFOLD NEPTUNE II (INSTRUMENTS) ×2 IMPLANT
NDL SPNL 20GX3.5 QUINCKE YW (NEEDLE) ×2 IMPLANT
NEEDLE SPNL 20GX3.5 QUINCKE YW (NEEDLE) ×2 IMPLANT
NS IRRIG 500ML POUR BTL (IV SOLUTION) ×1 IMPLANT
PACK TOTAL KNEE (MISCELLANEOUS) ×1 IMPLANT
PAD ABD DERMACEA PRESS 5X9 (GAUZE/BANDAGES/DRESSINGS) ×2 IMPLANT
PAD WRAPON POLAR KNEE (MISCELLANEOUS) ×1 IMPLANT
PATELLA MEDIAL ATTUN 35MM KNEE (Knees) IMPLANT
PIN DRILL FIX HALF THREAD (BIT) ×2 IMPLANT
PIN DRILL QUICK PACK ×2 IMPLANT
PIN FIXATION 1/8DIA X 3INL (PIN) ×1 IMPLANT
PULSAVAC PLUS IRRIG FAN TIP (DISPOSABLE) ×1
SOL PREP PVP 2OZ (MISCELLANEOUS) ×1
SOLUTION IRRIG SURGIPHOR (IV SOLUTION) ×1 IMPLANT
SOLUTION PREP PVP 2OZ (MISCELLANEOUS) ×1 IMPLANT
SPONGE DRAIN TRACH 4X4 STRL 2S (GAUZE/BANDAGES/DRESSINGS) ×1 IMPLANT
SPONGE T-LAP 18X18 ~~LOC~~+RFID (SPONGE) IMPLANT
STAPLER SKIN PROX 35W (STAPLE) ×1 IMPLANT
STOCKINETTE IMPERV 14X48 (MISCELLANEOUS) ×1 IMPLANT
STRAP TIBIA SHORT (MISCELLANEOUS) ×1 IMPLANT
SUCTION FRAZIER HANDLE 10FR (MISCELLANEOUS) ×1
SUCTION TUBE FRAZIER 10FR DISP (MISCELLANEOUS) ×1 IMPLANT
SUT VIC AB 0 CT1 36 (SUTURE) ×1 IMPLANT
SUT VIC AB 1 CT1 36 (SUTURE) ×2 IMPLANT
SUT VIC AB 2-0 CT2 27 (SUTURE) ×1 IMPLANT
SYR 30ML LL (SYRINGE) ×2 IMPLANT
TIP FAN IRRIG PULSAVAC PLUS (DISPOSABLE) ×1 IMPLANT
TOWEL OR 17X26 4PK STRL BLUE (TOWEL DISPOSABLE) IMPLANT
TOWER CARTRIDGE SMART MIX (DISPOSABLE) ×1 IMPLANT
TRAP FLUID SMOKE EVACUATOR (MISCELLANEOUS) ×1 IMPLANT
TRAY FOLEY MTR SLVR 16FR STAT (SET/KITS/TRAYS/PACK) ×1 IMPLANT
WATER STERILE IRR 1000ML POUR (IV SOLUTION) ×1 IMPLANT
WRAPON POLAR PAD KNEE (MISCELLANEOUS) ×1

## 2022-05-08 NOTE — Progress Notes (Signed)
Patient awake/alert x4. Moving bil lower ext without event. Lunch provided 100% eaten and tolerated.  Waiting on bed assignment.

## 2022-05-08 NOTE — Interval H&P Note (Signed)
History and Physical Interval Note:  05/08/2022 6:06 AM  Erika [name]  has presented today for surgery, with the diagnosis of PRIMARY OSTEOARTHRITIS OF LEFT KNEE..  The various methods of treatment have been discussed with the patient and family. After consideration of risks, benefits and other options for treatment, the patient has consented to  Procedure(s): COMPUTER ASSISTED TOTAL KNEE ARTHROPLASTY - RNFA (Left) as a surgical intervention.  The patient's history has been reviewed, patient examined, no change in status, stable for surgery.  I have reviewed the patient's chart and labs.  Questions were answered to the patient's satisfaction.     [place]

## 2022-05-08 NOTE — Evaluation (Signed)
Physical Therapy Evaluation Patient Details Name: Erika [name] MRN: [id number] DOB: 16-Mar-1946 Today's Date: 05/08/2022  History of Present Illness  77 y/o female s/p L TKA on 05/08/22. PMH: HTN, hx of R TKA  Clinical Impression  Patient s/p above procedure. PTA, patient lives with son and daughter in law and was independent with recent use of rollator due to pain. Presents with post op pain and weakness. ROM testing limited due to ACE wrap covering polar care. Completed bed mobility with supervision and sit to stand with min guard from low bed surface. Ambulated 180' with RW and supervision with cues for heel strike on L to promote L knee extension. Encouraged ankle pumps and quad sets this evening patient limited by ACE wrap and polar care. Patient will benefit from skilled PT services during acute stay to address listed deficits.      Recommendations for follow up therapy are one component of a multi-disciplinary discharge planning process, led by the attending physician.  Recommendations may be updated based on patient status, additional functional criteria and insurance authorization.  Follow Up Recommendations Follow physician's recommendations for discharge plan and follow up therapies      Assistance Recommended at Discharge Set up Supervision/Assistance  Patient can return home with the following  A little help with bathing/dressing/bathroom;Assistance with cooking/housework;Assist for transportation;Help with stairs or ramp for entrance    Equipment Recommendations Rolling Kenyotta Dorfman (2 wheels);BSC/3in1  Recommendations for Other Services       Functional Status Assessment Patient has had a recent decline in their functional status and demonstrates the ability to make significant improvements in function in a reasonable and predictable amount of time.     Precautions / Restrictions Precautions Precautions: Fall;Knee Precaution Booklet Issued: Yes (comment) Restrictions Weight  Bearing Restrictions: Yes LLE Weight Bearing: Weight bearing as tolerated      Mobility  Bed Mobility Overal bed mobility: Needs Assistance Bed Mobility: Supine to Sit     Supine to sit: HOB elevated, Supervision     General bed mobility comments: supervision for safety with HOB elevated    Transfers Overall transfer level: Needs assistance Equipment used: Rolling Beila Purdie (2 wheels) Transfers: Sit to/from Stand Sit to Stand: Min guard           General transfer comment: min guard for safety. Use of momentum to come into standing.    Ambulation/Gait Ambulation/Gait assistance: Min guard, Supervision Gait Distance (Feet): 180 Feet Assistive device: Rolling Randel Hargens (2 wheels) Gait Pattern/deviations: Step-to pattern, Decreased stride length, Decreased weight shift to left, Antalgic Gait velocity: decreased     General Gait Details: cues for heel strike on L during ambulation to promote L knee extension. No overt LOB. demos step to gait pattern with cues for step through  [employer] Mobility    Modified Rankin (Stroke Patients Only)       Balance Overall balance assessment: Mild deficits observed, not formally tested                                           Pertinent Vitals/Pain Pain Assessment Pain Assessment: Faces Faces Pain Scale: Hurts little more Pain Location: L knee Pain Descriptors / Indicators: Discomfort Pain Intervention(s): Monitored during session, Limited activity within patient's tolerance, Repositioned    Home Living Family/patient expects to be discharged to:: Private residence  Living Arrangements: Children;Other relatives (son has hx of TBI from when he was 50 years old (high functioning) and daughter in law who is high functioning autism) Available Help at Discharge: Family Type of Home: House Home Access: Stairs to enter Entrance Stairs-Rails: None Entrance Stairs-Number of Steps: 2-3  (reports landing that fits RW on it with no rail)   Home Layout: One level Home Equipment: Rollator (4 wheels)      Prior Function Prior Level of Function : Independent/Modified Independent;Driving             Mobility Comments: recently has been using rollator for mobility       Hand Dominance        Extremity/Trunk Assessment   Upper Extremity Assessment Upper Extremity Assessment: Overall WFL for tasks assessed    Lower Extremity Assessment Lower Extremity Assessment: LLE deficits/detail LLE Deficits / Details: deficits consistent with post op pain and weakness    Cervical / Trunk Assessment Cervical / Trunk Assessment: Normal  Communication   Communication: HOH  Cognition Arousal/Alertness: Awake/alert Behavior During Therapy: WFL for tasks assessed/performed Overall Cognitive Status: Within Functional Limits for tasks assessed                                          General Comments      Exercises Other Exercises Other Exercises: provided patient with HEP packet. ROM limited 2/2 ACE wrap covering polar care. Encouraged quad sets and ankle pumps overnight   Assessment/Plan    PT Assessment Patient needs continued PT services  PT Problem List Decreased strength;Decreased range of motion;Decreased activity tolerance;Decreased balance;Decreased mobility;Decreased knowledge of use of DME;Decreased safety awareness;Decreased knowledge of precautions       PT Treatment Interventions DME instruction;Stair training;Gait training;Functional mobility training;Therapeutic activities;Therapeutic exercise;Balance training;Patient/family education    PT Goals (Current goals can be found in the Care Plan section)  Acute Rehab PT Goals Patient Stated Goal: to get stronger and go home PT Goal Formulation: With patient Time For Goal Achievement: 05/22/22 Potential to Achieve Goals: Good    Frequency BID     Co-evaluation                AM-PAC PT "6 Clicks" Mobility  Outcome Measure Help needed turning from your back to your side while in a flat bed without using bedrails?: A Little Help needed moving from lying on your back to sitting on the side of a flat bed without using bedrails?: A Little Help needed moving to and from a bed to a chair (including a wheelchair)?: A Little Help needed standing up from a chair using your arms (e.g., wheelchair or bedside chair)?: A Little Help needed to walk in hospital room?: A Little Help needed climbing 3-5 steps with a railing? : A Little 6 Click Score: 18    End of Session Equipment Utilized During Treatment: Gait belt Activity Tolerance: Patient tolerated treatment well Patient left: in chair;with call bell/phone within reach Nurse Communication: Mobility status PT Visit Diagnosis: Unsteadiness on feet (R26.81);Other abnormalities of gait and mobility (R26.89);Muscle weakness (generalized) (M62.81)    Time: 4401-0272 PT Time Calculation (min) (ACUTE ONLY): 39 min   Charges:   PT Evaluation $PT Eval Low Complexity: 1 Low PT Treatments $Gait Training: 8-22 mins $Therapeutic Exercise: 8-22 mins        Presley Gora A. Gilford Rile PT, DPT [facility] - Acute Rehabilitation Services  Linna Hoff 05/08/2022, 4:37 PM

## 2022-05-08 NOTE — Plan of Care (Signed)
  Problem: Education: ?Goal: Knowledge of the prescribed therapeutic regimen will improve ?Outcome: Progressing ?Goal: Individualized Educational Video(s) ?Outcome: Progressing ?  ?Problem: Activity: ?Goal: Ability to avoid complications of mobility impairment will improve ?Outcome: Progressing ?Goal: Range of joint motion will improve ?Outcome: Progressing ?  ?Problem: Clinical Measurements: ?Goal: Postoperative complications will be avoided or minimized ?Outcome: Progressing ?  ?Problem: Pain Management: ?Goal: Pain level will decrease with appropriate interventions ?Outcome: Progressing ?  ?Problem: Skin Integrity: ?Goal: Will show signs of wound healing ?Outcome: Progressing ?  ?Problem: Education: ?Goal: Knowledge of General Education information will improve ?Description: Including pain rating scale, medication(s)/side effects and non-pharmacologic comfort measures ?Outcome: Progressing ?  ?Problem: Health Behavior/Discharge Planning: ?Goal: Ability to manage health-related needs will improve ?Outcome: Progressing ?  ?Problem: Clinical Measurements: ?Goal: Ability to maintain clinical measurements within normal limits will improve ?Outcome: Progressing ?Goal: Will remain free from infection ?Outcome: Progressing ?Goal: Diagnostic test results will improve ?Outcome: Progressing ?Goal: Respiratory complications will improve ?Outcome: Progressing ?Goal: Cardiovascular complication will be avoided ?Outcome: Progressing ?  ?Problem: Activity: ?Goal: Risk for activity intolerance will decrease ?Outcome: Progressing ?  ?Problem: Nutrition: ?Goal: Adequate nutrition will be maintained ?Outcome: Progressing ?  ?Problem: Coping: ?Goal: Level of anxiety will decrease ?Outcome: Progressing ?  ?Problem: Elimination: ?Goal: Will not experience complications related to bowel motility ?Outcome: Progressing ?Goal: Will not experience complications related to urinary retention ?Outcome: Progressing ?  ?Problem: Pain  Managment: ?Goal: General experience of comfort will improve ?Outcome: Progressing ?  ?Problem: Safety: ?Goal: Ability to remain free from injury will improve ?Outcome: Progressing ?  ?Problem: Skin Integrity: ?Goal: Risk for impaired skin integrity will decrease ?Outcome: Progressing ?  ?

## 2022-05-08 NOTE — Progress Notes (Signed)
Patient awake/alert x4. NO c/o's pain, able to move bil lower ext. Spoke with patient's son on phone, updated regarding bed assignment. He will leave hospital and nurse will call when bed available.

## 2022-05-08 NOTE — Anesthesia Preprocedure Evaluation (Signed)
Anesthesia Evaluation  Patient identified by MRN, date of birth, ID band Patient awake    Reviewed: Allergy & Precautions, NPO status , Patient's Chart, lab work & pertinent test results  History of Anesthesia Complications (+) PONV and history of anesthetic complications  Airway Mallampati: III  TM Distance: <3 FB Neck ROM: full    Dental  (+) Chipped, Poor Dentition, Missing   Pulmonary neg shortness of breath   Pulmonary exam normal        Cardiovascular Exercise Tolerance: Good hypertension, (-) angina Normal cardiovascular exam+ dysrhythmias Atrial Fibrillation      Neuro/Psych  Headaches  Anxiety      negative psych ROS   GI/Hepatic Neg liver ROS,GERD  Controlled,,  Endo/Other  negative endocrine ROS    Renal/GU      Musculoskeletal   Abdominal   Peds  Hematology negative hematology ROS (+)   Anesthesia Other Findings Past Medical History: No date: A-fib ([place]) No date: Adrenal nodule ([facility]) No date: Anxiety No date: Arthritis No date: Atrophic vaginitis No date: Cancer ([place])     Comment:  oral No date: Chronic hyponatremia No date: Dysrhythmia No date: Female climacteric state No date: GERD (gastroesophageal reflux disease) No date: Headache No date: Hypercholesteremia No date: Hyperlipidemia No date: Hypertension No date: Leukopenia No date: Pneumonia No date: PONV (postoperative nausea and vomiting) No date: Recurrent UTI No date: Right thyroid nodule No date: Thrombocytopenia ([place]) No date: Tongue ulcer  Past Surgical History: No date: COLONOSCOPY No date: ESOPHAGOGASTRODUODENOSCOPY 02/16/2020: EXCISION OF TONGUE LESION; N/A     Comment:  Procedure: EXCISION OF TONGUE LESION Partial               Glossectomy;  Surgeon: [name], MD;  Location: [place];  Service: ENT;  Laterality: N/A; No date: EYE SURGERY     Comment:  cataract both eyes 2022: HAMMER TOE SURGERY;  Right No date: HERNIA REPAIR     Comment:  x2  groin No date: JOINT REPLACEMENT 02/16/2020: PARTIAL GLOSSECTOMY     Comment:  EXCISION OF TONGUE LESION Partial Glossectomy (N/A               Mouth) 02/16/2020: RADICAL NECK DISSECTION; Left     Comment:  Procedure: RADICAL NECK DISSECTION Partial Left Neck               Dissection;  Surgeon: [name], MD;  Location: [place];              Service: ENT;  Laterality: Left; No date: REPLACEMENT TOTAL KNEE     Comment:  right knee 05/2017: SKIN CANCER EXCISION     Comment:  Excised from calf area No date: TONSILLECTOMY No date: TUBAL LIGATION     Reproductive/Obstetrics negative OB ROS                             Anesthesia Physical Anesthesia Plan  ASA: 3  Anesthesia Plan: Spinal   Post-op Pain Management:    Induction:   PONV Risk Score and Plan:   Airway Management Planned: Natural Airway and Nasal Cannula  Additional Equipment:   Intra-op Plan:   Post-operative Plan:   Informed Consent: I have reviewed the patients History and Physical, chart, labs and discussed the procedure including the risks, benefits and alternatives for the proposed anesthesia with the patient or authorized  representative who has indicated his/her understanding and acceptance.     Dental Advisory Given  Plan Discussed with: Anesthesiologist, CRNA and Surgeon  Anesthesia Plan Comments: (Patient reports no bleeding problems and no anticoagulant use.  Plan for spinal with backup GA  Patient consented for risks of anesthesia including but not limited to:  - adverse reactions to medications - damage to eyes, teeth, lips or other oral mucosa - nerve damage due to positioning  - risk of bleeding, infection and or nerve damage from spinal that could lead to paralysis - risk of headache or failed spinal - damage to teeth, lips or other oral mucosa - sore throat or hoarseness - damage to heart, brain, nerves, lungs,  other parts of body or loss of life  Patient voiced understanding.)       Anesthesia Quick Evaluation

## 2022-05-08 NOTE — Anesthesia Procedure Notes (Signed)
Spinal  Patient location during procedure: OR Start time: 05/08/2022 7:23 AM End time: 05/08/2022 7:28 AM Reason for block: surgical anesthesia Staffing Performed: resident/CRNA  Anesthesiologist: Piscitello, Precious Haws, MD Resident/CRNA: Lia Foyer, CRNA Performed by: Lia Foyer, CRNA Authorized by: Andria Frames, MD   Preanesthetic Checklist Completed: patient identified, IV checked, site marked, risks and benefits discussed, surgical consent, monitors and equipment checked, pre-op evaluation and timeout performed Spinal Block Patient position: sitting Prep: DuraPrep Patient monitoring: heart rate, cardiac monitor, continuous pulse ox and blood pressure Approach: midline Location: L3-4 Injection technique: single-shot Needle Needle type: Pencan  Needle gauge: 24 G Needle length: 9 cm Assessment Sensory level: T4 Events: CSF return

## 2022-05-08 NOTE — Transfer of Care (Signed)
Immediate Anesthesia Transfer of Care Note  Patient: Erika [name]  Procedure(s) Performed: COMPUTER ASSISTED TOTAL KNEE ARTHROPLASTY - RNFA (Left: Knee)  Patient Location: PACU  Anesthesia Type:General and Spinal  Level of Consciousness: drowsy  Airway & Oxygen Therapy: Patient Spontanous Breathing  Post-op Assessment: Report given to RN and Post -op Vital signs reviewed and stable  Post vital signs: Reviewed and stable  Last Vitals:  Vitals Value Taken Time  BP 102/62 05/08/22 1101  Temp    Pulse 73 05/08/22 1106  Resp 19 05/08/22 1106  SpO2 97 % 05/08/22 1106  Vitals shown include unvalidated device data.  Last Pain:  Vitals:   05/08/22 0642  TempSrc: Temporal  PainSc: 2          Complications: No notable events documented.

## 2022-05-08 NOTE — Op Note (Signed)
OPERATIVE NOTE  DATE OF SURGERY:  05/08/2022  PATIENT NAME:  Erika [name]   DOB: May 20, 1945  MRN: [id number]  PRE-OPERATIVE DIAGNOSIS: Degenerative arthrosis of the left knee, primary  POST-OPERATIVE DIAGNOSIS:  Same  PROCEDURE:  Left total knee arthroplasty using computer-assisted navigation  SURGEON:  Marciano Sequin. M.D.  ANESTHESIA: spinal  ESTIMATED BLOOD LOSS: 50 mL  FLUIDS REPLACED: 900 mL of crystalloid  TOURNIQUET TIME: 93 minutes  DRAINS: 2 medium Hemovac drains  SOFT TISSUE RELEASES: Anterior cruciate ligament, posterior cruciate ligament, deep medial collateral ligament, patellofemoral ligament  IMPLANTS UTILIZED: DePuy Attune size 4N posterior stabilized femoral component (cemented), size 4 rotating platform tibial component (cemented), 35 mm medialized dome patella (cemented), and an 8 mm stabilized rotating platform polyethylene insert.  INDICATIONS FOR SURGERY: Erika [name] is a 77 y.o. year old female with a long history of progressive knee pain. X-rays demonstrated severe degenerative changes in tricompartmental fashion. The patient had not seen any significant improvement despite conservative nonsurgical intervention. After discussion of the risks and benefits of surgical intervention, the patient expressed understanding of the risks benefits and agree with plans for total knee arthroplasty.   The risks, benefits, and alternatives were discussed at length including but not limited to the risks of infection, bleeding, nerve injury, stiffness, blood clots, the need for revision surgery, cardiopulmonary complications, among others, and they were willing to proceed.  PROCEDURE IN DETAIL: The patient was brought into the operating room and, after adequate spinal anesthesia was achieved, a tourniquet was placed on the patient's upper thigh. The patient's knee and leg were cleaned and prepped with alcohol and DuraPrep and draped in the usual sterile fashion. A  "timeout" was performed as per usual protocol. The lower extremity was exsanguinated using an Esmarch, and the tourniquet was inflated to 300 mmHg. An anterior longitudinal incision was made followed by a standard mid vastus approach. The deep fibers of the medial collateral ligament were elevated in a subperiosteal fashion off of the medial flare of the tibia so as to maintain a continuous soft tissue sleeve. The patella was subluxed laterally and the patellofemoral ligament was incised. Inspection of the knee demonstrated severe degenerative changes with full-thickness loss of articular cartilage. Osteophytes were debrided using a rongeur. Anterior and posterior cruciate ligaments were excised. Two 4.0 mm Schanz pins were inserted in the femur and into the tibia for attachment of the array of trackers used for computer-assisted navigation. Hip center was identified using a circumduction technique. Distal landmarks were mapped using the computer. The distal femur and proximal tibia were mapped using the computer. The distal femoral cutting guide was positioned using computer-assisted navigation so as to achieve a 5 distal valgus cut. The femur was sized and it was felt that a size 4N femoral component was appropriate. A size 4 femoral cutting guide was positioned and the anterior cut was performed and verified using the computer. This was followed by completion of the posterior and chamfer cuts. Femoral cutting guide for the central box was then positioned in the center box cut was performed.  Attention was then directed to the proximal tibia. Medial and lateral menisci were excised. The extramedullary tibial cutting guide was positioned using computer-assisted navigation so as to achieve a 0 varus-valgus alignment and 3 posterior slope. The cut was performed and verified using the computer. The proximal tibia was sized and it was felt that a size 4 tibial tray was appropriate. Tibial and femoral trials were  inserted followed  by insertion of an 8 mm polyethylene insert. This allowed for excellent mediolateral soft tissue balancing both in flexion and in full extension. Finally, the patella was cut and prepared so as to accommodate a 35 mm medialized dome patella. A patella trial was placed and the knee was placed through a range of motion with excellent patellar tracking appreciated. The femoral trial was removed after debridement of posterior osteophytes. The central post-hole for the tibial component was reamed followed by insertion of a keel punch. Tibial trials were then removed. Cut surfaces of bone were irrigated with copious amounts of normal saline using pulsatile lavage and then suctioned dry. Polymethylmethacrylate cement was prepared in the usual fashion using a vacuum mixer. Cement was applied to the cut surface of the proximal tibia as well as along the undersurface of a size 4 rotating platform tibial component. Tibial component was positioned and impacted into place. Excess cement was removed using [employer] fast streamer. Cement was then applied to the cut surfaces of the femur as well as along the posterior flanges of the size 4N femoral component. The femoral component was positioned and impacted into place. Excess cement was removed using [employer] fast streamer. An 8 mm polyethylene trial was inserted and the knee was brought into full extension with steady axial compression applied. Finally, cement was applied to the backside of a 35 mm medialized dome patella and the patellar component was positioned and patellar clamp applied. Excess cement was removed using [employer] fast streamer. After adequate curing of the cement, the tourniquet was deflated after a total tourniquet time of 93 minutes. Hemostasis was achieved using electrocautery. The knee was irrigated with copious amounts of normal saline using pulsatile lavage followed by 450 ml of Surgiphor and then suctioned dry. 20 mL of 1.3% Exparel and 60 mL of 0.25%  Marcaine in 40 mL of normal saline was injected along the posterior capsule, medial and lateral gutters, and along the arthrotomy site. An 8 mm stabilized rotating platform polyethylene insert was inserted and the knee was placed through a range of motion with excellent mediolateral soft tissue balancing appreciated and excellent patellar tracking noted. 2 medium drains were placed in the wound bed and brought out through separate stab incisions. The medial parapatellar portion of the incision was reapproximated using interrupted sutures of #1 Vicryl. Subcutaneous tissue was approximated in layers using first #0 Vicryl followed #2-0 Vicryl. The skin was approximated with skin staples. A sterile dressing was applied.  The patient tolerated the procedure well and was transported to the recovery room in stable condition.    Jai Bear P. Holley Bouche., M.D.

## 2022-05-08 NOTE — Plan of Care (Signed)
  Problem: Education: ?Goal: Knowledge of the prescribed therapeutic regimen will improve ?Outcome: Progressing ?  ?Problem: Activity: ?Goal: Ability to avoid complications of mobility impairment will improve ?Outcome: Progressing ?  ?Problem: Pain Management: ?Goal: Pain level will decrease with appropriate interventions ?Outcome: Progressing ?  ?Problem: Skin Integrity: ?Goal: Will show signs of wound healing ?Outcome: Progressing ?  ?

## 2022-05-09 DIAGNOSIS — Z79899 Other long term (current) drug therapy: Secondary | ICD-10-CM | POA: Diagnosis not present

## 2022-05-09 DIAGNOSIS — I480 Paroxysmal atrial fibrillation: Secondary | ICD-10-CM | POA: Diagnosis not present

## 2022-05-09 DIAGNOSIS — Z85819 Personal history of malignant neoplasm of unspecified site of lip, oral cavity, and pharynx: Secondary | ICD-10-CM | POA: Diagnosis not present

## 2022-05-09 DIAGNOSIS — I10 Essential (primary) hypertension: Secondary | ICD-10-CM | POA: Diagnosis not present

## 2022-05-09 DIAGNOSIS — Z96651 Presence of right artificial knee joint: Secondary | ICD-10-CM | POA: Diagnosis not present

## 2022-05-09 DIAGNOSIS — M1712 Unilateral primary osteoarthritis, left knee: Secondary | ICD-10-CM | POA: Diagnosis not present

## 2022-05-09 MED ORDER — ENOXAPARIN SODIUM 40 MG/0.4ML IJ SOSY: 40.0000 mg | PREFILLED_SYRINGE | INTRAMUSCULAR | 0 refills | Status: DC

## 2022-05-09 MED ORDER — TRAMADOL HCL 50 MG PO TABS: 50.0000 mg | ORAL_TABLET | ORAL | 0 refills | Status: DC | PRN

## 2022-05-09 MED ORDER — CELECOXIB 200 MG PO CAPS: 200.0000 mg | ORAL_CAPSULE | Freq: Two times a day (BID) | ORAL | 0 refills | Status: AC

## 2022-05-09 MED ORDER — OXYCODONE HCL 5 MG PO TABS: 5.0000 mg | ORAL_TABLET | ORAL | 0 refills | Status: DC | PRN

## 2022-05-09 MED ORDER — ONDANSETRON HCL 4 MG PO TABS: 4.0000 mg | ORAL_TABLET | Freq: Four times a day (QID) | ORAL | 0 refills | Status: DC | PRN

## 2022-05-09 NOTE — Progress Notes (Signed)
Physical Therapy Treatment Patient Details Name: Erika [name] MRN: [id number] DOB: 12-Mar-1946 Today's Date: 05/09/2022   History of Present Illness 77 y/o female s/p L TKA on 05/08/22. PMH: HTN, hx of R TKA    PT Comments     Pt is progressing with transfers performing at ModI with RW and with gait with RW 180' at supervision level.  Pt is making slow progress with beginning a step through gait pattern at end of walk.  Pt demonstrates ModI functional standing balance with bathroom routine.  At end of session, pt felt nauseous and light headed. seated BP 60/39 RN notified and plan to perform step negotiation later this am. Current PT D/C plan is appropriate.  Recommendations for follow up therapy are one component of a multi-disciplinary discharge planning process, led by the attending physician.  Recommendations may be updated based on patient status, additional functional criteria and insurance authorization.  Follow Up Recommendations  Follow physician's recommendations for discharge plan and follow up therapies     Assistance Recommended at Discharge Set up Supervision/Assistance  Patient can return home with the following A little help with bathing/dressing/bathroom;Assistance with cooking/housework;Assist for transportation;Help with stairs or ramp for entrance   Equipment Recommendations  Rolling walker (2 wheels);BSC/3in1    Recommendations for Other Services       Precautions / Restrictions Precautions Precautions: Fall;Knee Precaution Booklet Issued: Yes (comment) Restrictions Weight Bearing Restrictions: Yes LLE Weight Bearing: Weight bearing as tolerated     Mobility  Bed Mobility               General bed mobility comments: Pt up in recliner.    Transfers Overall transfer level: Needs assistance Equipment used: Rolling walker (2 wheels) Transfers: Sit to/from Stand Sit to Stand: Modified independent (Device/Increase time)                 Ambulation/Gait Ambulation/Gait assistance: Supervision Gait Distance (Feet): 180 Feet Assistive device: Rolling walker (2 wheels) Gait Pattern/deviations: Step-to pattern, Decreased stride length, Decreased weight shift to left, Antalgic, Step-through pattern Gait velocity: decreased     General Gait Details: cues for heel strike on L during ambulation to promote L knee extension. No overt LOB. demos step to gait pattern with cues for step through. Pt able to begin to step through at end of walk.   Stairs Stairs: Yes           Wheelchair Mobility    Modified Rankin (Stroke Patients Only)       Balance Overall balance assessment: Modified Independent                                          Cognition Arousal/Alertness: Awake/alert Behavior During Therapy: WFL for tasks assessed/performed Overall Cognitive Status: Within Functional Limits for tasks assessed                                          Exercises      General Comments General comments (skin integrity, edema, etc.): Pt able to perform bathroom routine at ModI level.      Pertinent Vitals/Pain Pain Assessment Pain Assessment: 0-10 Pain Score: 2  Pain Location: L knee Pain Descriptors / Indicators: Discomfort    Home Living Family/patient expects to be discharged to:: Private residence Living  Arrangements: Children Available Help at Discharge: Family Type of Home: House Home Access: Stairs to enter Entrance Stairs-Rails: None Entrance Stairs-Number of Steps: 2-3 (reports landing that fits RW on it with no rail)   Home Layout: One level Home Equipment: Rollator (4 wheels);Adaptive equipment      Prior Function            PT Goals (current goals can now be found in the care plan section) Acute Rehab PT Goals Patient Stated Goal: to get stronger and go home PT Goal Formulation: With patient Time For Goal Achievement: 05/22/22 Potential to Achieve  Goals: Good Progress towards PT goals: Progressing toward goals    Frequency    BID      PT Plan      Co-evaluation              AM-PAC PT "6 Clicks" Mobility   Outcome Measure  Help needed turning from your back to your side while in a flat bed without using bedrails?: A Little Help needed moving from lying on your back to sitting on the side of a flat bed without using bedrails?: A Little Help needed moving to and from a bed to a chair (including a wheelchair)?: None Help needed standing up from a chair using your arms (e.g., wheelchair or bedside chair)?: None Help needed to walk in hospital room?: None Help needed climbing 3-5 steps with a railing? : A Little 6 Click Score: 21    End of Session Equipment Utilized During Treatment: Gait belt Activity Tolerance: Patient tolerated treatment well;Other (comment) (at end of session pt felt nauseous and light headed. seated BP 60/39 RN notified and planned to perform step negotiation later this am.) Patient left: in chair;with call bell/phone within reach;with chair alarm set Nurse Communication: Mobility status PT Visit Diagnosis: Unsteadiness on feet (R26.81);Other abnormalities of gait and mobility (R26.89);Muscle weakness (generalized) (M62.81)     Time: 2641-5830 PT Time Calculation (min) (ACUTE ONLY): 40 min  Charges:  $Gait Training: 8-22 mins $Therapeutic Activity: 23-37 mins                     Bjorn Loser, PTA  05/09/22, 12:58 PM

## 2022-05-09 NOTE — Anesthesia Postprocedure Evaluation (Signed)
Anesthesia Post Note  Patient: Erika [name]  Procedure(s) Performed: COMPUTER ASSISTED TOTAL KNEE ARTHROPLASTY - RNFA (Left: Knee)  Patient location during evaluation: Nursing Unit Anesthesia Type: Spinal Level of consciousness: oriented and awake and alert Pain management: pain level controlled Vital Signs Assessment: post-procedure vital signs reviewed and stable Respiratory status: spontaneous breathing and respiratory function stable Cardiovascular status: blood pressure returned to baseline and stable Postop Assessment: no headache, no backache, no apparent nausea or vomiting and patient able to bend at knees Anesthetic complications: no   No notable events documented.   Last Vitals:  Vitals:   05/09/22 0420 05/09/22 0804  BP: 127/66 127/64  Pulse: 74 68  Resp: 18 18  Temp: 36.4 C 36.7 C  SpO2: 96% 96%    Last Pain:  Vitals:   05/09/22 0718  TempSrc:   PainSc: 0-No pain                 Martha Clan

## 2022-05-09 NOTE — Progress Notes (Signed)
OT Cancellation Note  Patient Details Name: Erika [name] MRN: [id number] DOB: [birth year]   Cancelled Treatment:    Reason Eval/Treat Not Completed: Other (comment). OT orders received, chart reviewed. Pt currently with PT upon entering room. Will re-attempt as able.   Doneta Public 05/09/2022, 9:45 AM

## 2022-05-09 NOTE — Progress Notes (Signed)
  Subjective: 1 Day Post-Op Procedure(s) (LRB): COMPUTER ASSISTED TOTAL KNEE ARTHROPLASTY - RNFA (Left) Patient reports pain as mild.   Patient is well, and has had no acute complaints or problems Plan is to go Home after hospital stay. Negative for chest pain and shortness of breath Fever: no Gastrointestinal:Negative for nausea and vomiting States that she is passing gas this morning without pain.  Objective: Vital signs in last 24 hours: Temp:  [97.3 F (36.3 C)-98 F (36.7 C)] 98 F (36.7 C) (01/13 0804) Pulse Rate:  [59-80] 68 (01/13 0804) Resp:  [12-19] 18 (01/13 0804) BP: (102-132)/(61-71) 127/64 (01/13 0804) SpO2:  [96 %-100 %] 96 % (01/13 0804) Weight:  [68.9 kg] 68.9 kg (01/12 2011)  Intake/Output from previous day:  Intake/Output Summary (Last 24 hours) at 05/09/2022 0843 Last data filed at 05/08/2022 2330 Gross per 24 hour  Intake 1706.53 ml  Output 1100 ml  Net 606.53 ml    Intake/Output this shift: No intake/output data recorded.  Labs: No results for input(s): "HGB" in the last 72 hours. No results for input(s): "WBC", "RBC", "HCT", "PLT" in the last 72 hours. No results for input(s): "NA", "K", "CL", "CO2", "BUN", "CREATININE", "GLUCOSE", "CALCIUM" in the last 72 hours. No results for input(s): "LABPT", "INR" in the last 72 hours.   EXAM General - Patient is Alert, Appropriate, and Oriented Extremity - ABD soft Neurovascular intact Dorsiflexion/Plantar flexion intact No cellulitis present Compartment soft Dressing/Incision - Bulky dressing removed this morning, honeycomb with minimal bloody drainage noted.  Hemovac removed without complication, new 4x4 with tegaderm applied. Motor Function - intact, moving foot and toes well on exam.  Abdomen soft with intact bowel sounds this morning.  Past Medical History:  Diagnosis Date   A-fib ([place])    Adrenal nodule ([place])    Anxiety    Arthritis    Atrophic vaginitis    Cancer ([facility])    oral   Chronic  hyponatremia    Dysrhythmia    Female climacteric state    GERD (gastroesophageal reflux disease)    Headache    Hypercholesteremia    Hyperlipidemia    Hypertension    Leukopenia    Pneumonia    PONV (postoperative nausea and vomiting)    Recurrent UTI    Right thyroid nodule    Thrombocytopenia ([facility])    Tongue ulcer     Assessment/Plan: 1 Day Post-Op Procedure(s) (LRB): COMPUTER ASSISTED TOTAL KNEE ARTHROPLASTY - RNFA (Left) Principal Problem:   Total knee replacement status  Estimated body mass index is 26.91 kg/m as calculated from the following:   Height as of this encounter: '5\' 3"'$  (1.6 m).   Weight as of this encounter: 68.9 kg. Advance diet Up with therapy D/C IV fluids when tolerating po intake.  Vitals reviewed this AM. Hemovac removed without complication. Patient walked over 180 feet yesterday, needs to clear stair training today. Patient is passing gas without pain. Plan for discharge home today pending PT.  DVT Prophylaxis - Lovenox, TED hose, and SCDs Weight-Bearing as tolerated to left leg  J. Cameron Proud, PA-C [facility] Orthopaedic Surgery 05/09/2022, 8:43 AM

## 2022-05-09 NOTE — Progress Notes (Signed)
Physical Therapy Treatment Patient Details Name: Erika [name] MRN: [id number] DOB: 1945-12-17 Today's Date: 05/09/2022   History of Present Illness 77 y/o female s/p L TKA on 05/08/22. PMH: HTN, hx of R TKA    PT Comments    Pt felt better (from low BP) after an hour from this morning's session to attempt step negotiation. Pt performed with RW, one step without rails, supervision level with min cues for safe technique. Standing BP after gait, 126/74.  There are no barriers for d/c from PT stand point.  Current PT d/c plan is appropriate.   Recommendations for follow up therapy are one component of a multi-disciplinary discharge planning process, led by the attending physician.  Recommendations may be updated based on patient status, additional functional criteria and insurance authorization.  Follow Up Recommendations  Follow physician's recommendations for discharge plan and follow up therapies     Assistance Recommended at Discharge Set up Supervision/Assistance  Patient can return home with the following A little help with bathing/dressing/bathroom;Assistance with cooking/housework;Assist for transportation;Help with stairs or ramp for entrance   Equipment Recommendations  Other (comment) (Pt has RW and handicap toilet; declined BSC.)    Recommendations for Other Services       Precautions / Restrictions Precautions Precautions: Fall;Knee Precaution Booklet Issued: Yes (comment) Restrictions Weight Bearing Restrictions: Yes LLE Weight Bearing: Weight bearing as tolerated     Mobility  Bed Mobility               General bed mobility comments: Pt up in recliner.    Transfers Overall transfer level: Modified independent Equipment used: Rolling walker (2 wheels) Transfers: Sit to/from Stand Sit to Stand: Modified independent (Device/Increase time)                Ambulation/Gait Ambulation/Gait assistance: Supervision Gait Distance (Feet): 30  Feet Assistive device: Rolling walker (2 wheels) Gait Pattern/deviations: Decreased stride length, Decreased weight shift to left, Antalgic, Step-through pattern Gait velocity: decreased     General Gait Details: cues for heel strike on L during ambulation to promote L knee extension. No overt LOB. demos step to gait pattern with cues for step through. Pt able to begin to step through at end of walk.   Stairs Stairs: Yes Stairs assistance: Supervision Stair Management: With walker, Forwards, No rails Number of Stairs: 2 (x2) General stair comments: cues for safe sequence   Wheelchair Mobility    Modified Rankin (Stroke Patients Only)       Balance Overall balance assessment: Modified Independent                                          Cognition Arousal/Alertness: Awake/alert Behavior During Therapy: WFL for tasks assessed/performed Overall Cognitive Status: Within Functional Limits for tasks assessed                                          Exercises      General Comments General comments (skin integrity, edema, etc.): Pt able to perform bathroom routine at ModI level.      Pertinent Vitals/Pain Pain Assessment Pain Assessment: 0-10 Pain Score: 2  Pain Location: L knee Pain Descriptors / Indicators: Discomfort Pain Intervention(s): Monitored during session    Home Living Family/patient expects to be discharged to:: Private  residence Living Arrangements: Children;Other relatives (son has hx of TBI from when he was 11 years old (high functioning) and daughter in law who is high functioning autism) Available Help at Discharge: Family Type of Home: House Home Access: Stairs to enter Entrance Stairs-Rails: None Entrance Stairs-Number of Steps: 2-3 (reports landing that fits RW on it with no rail)   Home Layout: One level Home Equipment: Rollator (4 wheels);Adaptive equipment Additional Comments: other son lives next door and  will be available to provide assistance PRN at D/C    Prior Function            PT Goals (current goals can now be found in the care plan section) Acute Rehab PT Goals Patient Stated Goal: to get stronger and go home PT Goal Formulation: With patient Time For Goal Achievement: 05/22/22 Potential to Achieve Goals: Good Progress towards PT goals: Progressing toward goals    Frequency    BID      PT Plan      Co-evaluation              AM-PAC PT "6 Clicks" Mobility   Outcome Measure  Help needed turning from your back to your side while in a flat bed without using bedrails?: A Little Help needed moving from lying on your back to sitting on the side of a flat bed without using bedrails?: A Little Help needed moving to and from a bed to a chair (including a wheelchair)?: None Help needed standing up from a chair using your arms (e.g., wheelchair or bedside chair)?: None Help needed to walk in hospital room?: None Help needed climbing 3-5 steps with a railing? : A Little 6 Click Score: 21    End of Session Equipment Utilized During Treatment: Gait belt Activity Tolerance: Patient tolerated treatment well Patient left: in chair;with call bell/phone within reach;with chair alarm set;with family/visitor present Nurse Communication: Mobility status PT Visit Diagnosis: Unsteadiness on feet (R26.81);Other abnormalities of gait and mobility (R26.89);Muscle weakness (generalized) (M62.81)     Time: 4720-7218 PT Time Calculation (min) (ACUTE ONLY): 13 min  Charges:  $Gait Training: 8-22 mins $Therapeutic Activity: 23-37 mins                     Bjorn Loser, PTA  05/09/22, 1:23 PM

## 2022-05-09 NOTE — TOC Transition Note (Signed)
Transition of Care [facility]) - CM/SW Discharge Note   Patient Details  Name: Erika [name] MRN: [id number] Date of Birth: 12/13/1945  Transition of [facility]) CM/SW Contact:  Izola Price, RN Phone Number: 05/09/2022, 10:47 AM   Clinical Narrative: 1/13: Patient to be discharge today. Set up with [facility] for [facility] service prior to admission. Has RW and declines a BSC when speaking with patient this am. Explained where to find one if she changes her mind. Son here to transport home. Simmie Davies RN CM             Patient Goals and CMS Choice      Discharge Placement                         Discharge Plan and Services Additional resources added to the After Visit Summary for                                       Social Determinants of Health (SDOH) Interventions SDOH Screenings   Food Insecurity: No Food Insecurity (05/08/2022)  Housing: Low Risk  (05/08/2022)  Transportation Needs: No Transportation Needs (05/08/2022)  Utilities: Not At Risk (05/08/2022)  Financial Resource Strain: Low Risk  (03/22/2020)  Social Connections: Moderately Integrated (03/22/2020)  Stress: Stress Concern Present (03/22/2020)  Tobacco Use: Low Risk  (05/08/2022)     Readmission Risk Interventions     No data to display

## 2022-05-09 NOTE — Discharge Summary (Signed)
Physician Discharge Summary  Patient ID: Erika [name] MRN: [id number] DOB/AGE: [birth year] 77 y.o.  Admit date: 05/08/2022 Discharge date: 05/09/2022  Admission Diagnoses:  Total knee replacement status [Z96.659] Degenerative arthrosis of the left knee, primary   Discharge Diagnoses: Patient Active Problem List   Diagnosis Date Noted   Total knee replacement status 05/08/2022   Intractable nausea and vomiting 04/19/2020   Squamous cell carcinoma of lateral tongue ([place]) 02/16/2020   Torus mandibularis 12/14/2019   Hyperlipidemia 12/14/2017   Abnormal EKG 12/14/2017   Thyroid nodule 12/14/2017   A-fib (HCC)    AF (paroxysmal atrial fibrillation) ([place]) 10/15/2017   Leukopenia 10/12/2017   Thrombocytopenia ([place]) 10/12/2017   Elevated transaminase level 10/12/2017   Acute hyponatremia 10/12/2017   Sepsis secondary to UTI ([place]) 10/12/2017   Hypertension 10/12/2017   Positive D dimer 10/12/2017   Recurrent UTI 10/12/2017   GERD (gastroesophageal reflux disease) 10/12/2017   Arthritis 10/12/2017   Tick bites 10/12/2017   Status post total right knee replacement 03/31/2015   Chronic cough 11/03/2012    Past Medical History:  Diagnosis Date   A-fib ([place])    Adrenal nodule ([place])    Anxiety    Arthritis    Atrophic vaginitis    Cancer ([place])    oral   Chronic hyponatremia    Dysrhythmia    Female climacteric state    GERD (gastroesophageal reflux disease)    Headache    Hypercholesteremia    Hyperlipidemia    Hypertension    Leukopenia    Pneumonia    PONV (postoperative nausea and vomiting)    Recurrent UTI    Right thyroid nodule    Thrombocytopenia (HCC)    Tongue ulcer      Transfusion: None.   Consultants (if any):   Discharged Condition: Improved  Hospital Course: Erika [name] is an 77 y.o. female who was admitted 05/08/2022 with a diagnosis of primary osteoarthritis of the left knee and went to the operating room on 05/08/2022 and underwent the above  named procedures.    Surgeries: Procedure(s): COMPUTER ASSISTED TOTAL KNEE ARTHROPLASTY - RNFA on 05/08/2022 Patient tolerated the surgery well. Taken to PACU where she was stabilized and then transferred to the orthopedic floor.  Started on Lovenox '30mg'$  q 12 hrs. Heels elevated on bed with rolled towels. No evidence of DVT. Negative Homan. Physical therapy started on day #1 for gait training and transfer. OT started day #1 for ADL and assisted devices.  Patient's IV and hemovac were removed on POD1.  Foley was removed shortly after surgery.  Implants: DePuy Attune size 4N posterior stabilized femoral component (cemented), size 4 rotating platform tibial component (cemented), 35 mm medialized dome patella (cemented), and an 8 mm stabilized rotating platform polyethylene insert.   She was given perioperative antibiotics:  Anti-infectives (From admission, onward)    Start     Dose/Rate Route Frequency Ordered Stop   05/08/22 2200  nitrofurantoin (MACRODANTIN) capsule 50 mg        50 mg Oral Daily at bedtime 05/08/22 1508     05/08/22 1330  ceFAZolin (ANCEF) IVPB 2g/100 mL premix        2 g 200 mL/hr over 30 Minutes Intravenous Every 6 hours 05/08/22 1238 05/08/22 2028   05/08/22 1321  ceFAZolin (ANCEF) 2-4 GM/100ML-% IVPB       Note to Pharmacy: Erika [name] K: cabinet override      05/08/22 1321 05/09/22 0129   05/08/22 0635  ceFAZolin (ANCEF)  2-4 GM/100ML-% IVPB       Note to Pharmacy: Erika [name] S: cabinet override      05/08/22 0635 05/08/22 1532   05/08/22 0630  ceFAZolin (ANCEF) IVPB 2g/100 mL premix        2 g 200 mL/hr over 30 Minutes Intravenous On call to O.R. 05/08/22 0932 05/08/22 0741     .  She was given sequential compression devices, early ambulation, and Lovenox for DVT prophylaxis.  She benefited maximally from the hospital stay and there were no complications.    Recent vital signs:  Vitals:   05/09/22 0420 05/09/22 0804  BP: 127/66 127/64  Pulse: 74  68  Resp: 18 18  Temp: 97.6 F (36.4 C) 98 F (36.7 C)  SpO2: 96% 96%    Recent laboratory studies:  Lab Results  Component Value Date   HGB 12.2 04/24/2022   HGB 12.4 04/20/2020   HGB 13.0 04/19/2020   Lab Results  Component Value Date   WBC 4.6 04/24/2022   PLT 324 04/24/2022   Lab Results  Component Value Date   INR 1.22 10/12/2017   Lab Results  Component Value Date   NA 131 (L) 04/24/2022   K 4.7 04/24/2022   CL 96 (L) 04/24/2022   CO2 27 04/24/2022   BUN 13 04/24/2022   CREATININE 0.64 04/24/2022   GLUCOSE 110 (H) 04/24/2022    Discharge Medications:   Allergies as of 05/09/2022       Reactions   Hydrochlorothiazide Other (See Comments)   Brain swelling   Atorvastatin Other (See Comments)   cramps   Codeine Nausea And Vomiting   Strawberry Flavor Itching        Medication List     STOP taking these medications    ibuprofen 200 MG tablet Commonly known as: ADVIL       TAKE these medications    acetaminophen 500 MG tablet Commonly known as: TYLENOL Take 1,000 mg by mouth every 6 (six) hours as needed for moderate pain or headache.   amLODipine 5 MG tablet Commonly known as: NORVASC Take 1 tablet (5 mg total) by mouth daily. Please make overdue appt with Dr. Tamala Julian before anymore refills. 3rd and Final Attempt What changed: when to take this   ARTIFICIAL TEARS OP Place 1 drop into both eyes daily as needed (dry eyes).   CALCIUM 600 + D PO Take 2 tablets by mouth daily.   celecoxib 200 MG capsule Commonly known as: CELEBREX Take 1 capsule (200 mg total) by mouth 2 (two) times daily.   Cranberry 250 MG Caps See admin instructions.   diclofenac Sodium 1 % Gel Commonly known as: VOLTAREN Apply 1 application topically 4 (four) times daily as needed (pain).   enoxaparin 40 MG/0.4ML injection Commonly known as: LOVENOX Inject 0.4 mLs (40 mg total) into the skin daily.   esomeprazole 40 MG capsule Commonly known as: NEXIUM Take 40  mg by mouth daily.   Fiber Adult Gummies 2 g Chew See admin instructions.   fluticasone 50 MCG/ACT nasal spray Commonly known as: FLONASE INSTILL 2 SPRAYS IN EACH NOSTRIL EVERY DAY. What changed: See the new instructions.   LIDOCAINE PAIN RELIEF EX Apply 1 Application topically as needed. Roll on   loratadine 10 MG tablet Commonly known as: CLARITIN Take 10 mg by mouth daily.   losartan 100 MG tablet Commonly known as: COZAAR Take 1 tablet (100 mg total) by mouth daily. Please make overdue appt with Dr. Tamala Julian  before anymore refills. 2nd attempt   metoprolol tartrate 25 MG tablet Commonly known as: LOPRESSOR Take 25 mg by mouth 2 (two) times daily.   multivitamin with minerals Tabs tablet Take 1 tablet by mouth daily.   nitrofurantoin 50 MG capsule Commonly known as: MACRODANTIN Take 50 mg by mouth at bedtime.   ondansetron 4 MG tablet Commonly known as: ZOFRAN Take 1 tablet (4 mg total) by mouth every 6 (six) hours as needed for nausea.   oxyCODONE 5 MG immediate release tablet Commonly known as: Oxy IR/ROXICODONE Take 1 tablet (5 mg total) by mouth every 4 (four) hours as needed for moderate pain (pain score 4-6).   PRESERVISION AREDS 2 PO Take 1 tablet by mouth daily.   RA Probiotic Gummies Chew Chew 2 capsules by mouth daily.   simvastatin 20 MG tablet Commonly known as: ZOCOR Take 20 mg by mouth at bedtime.   traMADol 50 MG tablet Commonly known as: ULTRAM Take 1-2 tablets (50-100 mg total) by mouth every 4 (four) hours as needed for moderate pain.               Durable Medical Equipment  (From admission, onward)           Start     Ordered   05/08/22 1509  DME Walker rolling  Once       Question:  Patient needs a walker to treat with the following condition  Answer:  Total knee replacement status   05/08/22 1508   05/08/22 1509  DME Bedside commode  Once       Comments: Patient is not able to walk the distance required to go the bathroom,  or he/she is unable to safely negotiate stairs required to access the bathroom.  A 3in1 BSC will alleviate this problem  Question:  Patient needs a bedside commode to treat with the following condition  Answer:  Total knee replacement status   05/08/22 1508            Diagnostic Studies: DG Knee Left Port  Result Date: 05/08/2022 CLINICAL DATA:  Total knee replacement.  [id number] EXAM: PORTABLE LEFT KNEE - 1-2 VIEW COMPARISON:  None FINDINGS: Interval total knee arthroplasty. Surgical clips overlie the anterior aspect of the knee. Small postoperative fluid collection in the suprapatellar recess. Surgical drains are in place. Hardware is intact and well seated. No interval fracture. IMPRESSION: IMPRESSION Interval total knee arthroplasty without complicating features. Electronically Signed   By: Nolon Nations M.D.   On: 05/08/2022 11:29    Disposition: Plan for discharge home this afternoon pending progress with PT.     Follow-up Information     Lattie Corns, PA-C Follow up on 05/22/2022.   Specialty: Physician Assistant Why: at 1:15pm Contact information: [place] [address] [phone]         Dereck Leep, MD Follow up on 06/18/2022.   Specialty: Orthopedic Surgery Why: at 9:30am Contact information: [place] [address] [phone]                  Signed: Judson Roch PA-C 05/09/2022, 8:55 AM

## 2022-05-09 NOTE — Anesthesia Post-op Follow-up Note (Signed)
  Anesthesia Pain Follow-up Note  Patient: Erika [name]  Day #: 1  Date of Follow-up: 05/09/2022 Time: 1:47 PM  Last Vitals:  Vitals:   05/09/22 0420 05/09/22 0804  BP: 127/66 127/64  Pulse: 74 68  Resp: 18 18  Temp: 36.4 C 36.7 C  SpO2: 96% 96%    Level of Consciousness: alert  Pain: mild   Side Effects:None  Catheter Site Exam:clean, dry  Anti-Coag Meds (From admission, onward)    Start     Dose/Rate Route Frequency Ordered Stop   05/09/22 0800  enoxaparin (LOVENOX) injection 30 mg        30 mg Subcutaneous Every 12 hours 05/08/22 1508     05/09/22 0000  enoxaparin (LOVENOX) 40 MG/0.4ML injection        40 mg Subcutaneous Every 24 hours 05/09/22 0855          Plan: D/C from anesthesia care at surgeon's request  Martha Clan

## 2022-05-09 NOTE — Evaluation (Signed)
Occupational Therapy Evaluation Patient Details Name: Erika [name] MRN: [id number] DOB: [birth year] Today's Date: 05/09/2022   History of Present Illness 77 y/o female s/p L TKA on 05/08/22. PMH: HTN, hx of R TKA   Clinical Impression   Patient seen for OT evaluation. Son present. PTA pt lived with children, was independent for ADLs/IADLs, and independent for functional mobility without an AD. Pt currently functioning at set up-supervision for ADL tasks and Mod I for functional transfers using RW. Pt/son were educated on polar care system, precautions for LLE WBAT, no pillow under L knee, and "first in last out" compensatory dressing technique. Education and demonstration was provided for LB dressing AE including reacher and sock aid with good carryover. Pt able to complete full body dressing without AE. Pt's son will be available 24/7 upon discharge to provide physical assistance PRN for ADL tasks. Pt is close to baseline level of function with ADLs. Will keep on OT caseload in order to prevent deconditioning and facilitate safe discharge home. No follow up OT needs recommended at this time.      Recommendations for follow up therapy are one component of a multi-disciplinary discharge planning process, led by the attending physician.  Recommendations may be updated based on patient status, additional functional criteria and insurance authorization.   Follow Up Recommendations  No OT follow up     Assistance Recommended at Discharge Set up Supervision/Assistance  Patient can return home with the following A little help with walking and/or transfers;A little help with bathing/dressing/bathroom;Assistance with cooking/housework;Assist for transportation;Help with stairs or ramp for entrance    Functional Status Assessment  Patient has had a recent decline in their functional status and demonstrates the ability to make significant improvements in function in a reasonable and predictable amount of  time.  Equipment Recommendations  None recommended by OT    Recommendations for Other Services       Precautions / Restrictions Precautions Precautions: Fall;Knee Precaution Booklet Issued: Yes (comment) Restrictions Weight Bearing Restrictions: Yes LLE Weight Bearing: Weight bearing as tolerated      Mobility Bed Mobility               General bed mobility comments: NT, pt received/left in recliner    Transfers Overall transfer level: Needs assistance Equipment used: Rolling walker (2 wheels) Transfers: Sit to/from Stand Sit to Stand: Modified independent (Device/Increase time)                  Balance Overall balance assessment: Modified Independent                                         ADL either performed or assessed with clinical judgement   ADL Overall ADL's : Needs assistance/impaired     Grooming: Set up;Standing;Supervision/safety           Upper Body Dressing : Set up;Standing;Supervision/safety Upper Body Dressing Details (indicate cue type and reason): to doff gown and don sweatshirt Lower Body Dressing: Set up;Sitting/lateral leans;Sit to/from stand;Supervision/safety Lower Body Dressing Details (indicate cue type and reason): to thread underwear/pants in sitting then hike in standing Toilet Transfer: Rolling walker (2 wheels);Modified Independent Toilet Transfer Details (indicate cue type and reason): simulated Toileting- Clothing Manipulation and Hygiene: Supervision/safety;Sit to/from stand               Vision Baseline Vision/History: 1 Wears glasses Patient Visual Report: No  change from baseline       Perception     Praxis      Pertinent Vitals/Pain Pain Assessment Pain Assessment: 0-10 Pain Score: 4  Pain Location: L knee Pain Descriptors / Indicators: Discomfort Pain Intervention(s): Monitored during session, Repositioned     Hand Dominance     Extremity/Trunk Assessment Upper Extremity  Assessment Upper Extremity Assessment: Overall WFL for tasks assessed   Lower Extremity Assessment Lower Extremity Assessment: LLE deficits/detail LLE Deficits / Details: s/p L TKA   Cervical / Trunk Assessment Cervical / Trunk Assessment: Normal   Communication Communication Communication: HOH   Cognition Arousal/Alertness: Awake/alert Behavior During Therapy: WFL for tasks assessed/performed Overall Cognitive Status: Within Functional Limits for tasks assessed                                       General Comments     Exercises Other Exercises Other Exercises: OT provided education re: role of OT, OT POC, post acute recs, sitting up for all meals, EOB/OOB mobility with assistance, home/fall safety, LLE WBAT, no pillow under L knee, polar care mgt, LB dressing AE (reacher, sock aid), "first in last out" compensatory dressing technique   Shoulder Instructions      Home Living Family/patient expects to be discharged to:: Private residence Living Arrangements: Children;Other relatives (son has hx of TBI from when he was 48 years old (high functioning) and daughter in law who is high functioning autism) Available Help at Discharge: Family Type of Home: House Home Access: Stairs to enter [employer] of Steps: 2-3 (reports landing that fits RW on it with no rail) Entrance Stairs-Rails: None Home Layout: One level     Bathroom Shower/Tub: Teacher, early years/pre: Handicapped height     Home Equipment: Rollator (4 wheels);Adaptive equipment Adaptive Equipment: Reacher Additional Comments: other son lives next door and will be available to provide assistance PRN at D/C      Prior Functioning/Environment Prior Level of Function : Independent/Modified Independent;Driving             Mobility Comments: recently has been using rollator for mobility ADLs Comments: independent        OT Problem List: Decreased strength;Decreased  range of motion;Decreased activity tolerance;Decreased knowledge of use of DME or AE;Decreased knowledge of precautions;Pain;Impaired balance (sitting and/or standing)      OT Treatment/Interventions: Self-care/ADL training;Therapeutic exercise;Therapeutic activities;Energy conservation;DME and/or AE instruction;Patient/family education;Balance training    OT Goals(Current goals can be found in the care plan section) Acute Rehab OT Goals Patient Stated Goal: return home OT Goal Formulation: With patient/family Time For Goal Achievement: 05/23/22 Potential to Achieve Goals: Good   OT Frequency: Min 2X/week    Co-evaluation              AM-PAC OT "6 Clicks" Daily Activity     Outcome Measure Help from another person eating meals?: None Help from another person taking care of personal grooming?: None Help from another person toileting, which includes using toliet, bedpan, or urinal?: A Little Help from another person bathing (including washing, rinsing, drying)?: A Little Help from another person to put on and taking off regular upper body clothing?: None Help from another person to put on and taking off regular lower body clothing?: A Little 6 Click Score: 21   End of Session Equipment Utilized During Treatment: Rolling walker (2 wheels) Nurse Communication: Mobility status  Activity Tolerance: Patient tolerated treatment well Patient left: in chair;with call bell/phone within reach;with chair alarm set;with family/visitor present  OT Visit Diagnosis: Unsteadiness on feet (R26.81);Other abnormalities of gait and mobility (R26.89);Muscle weakness (generalized) (M62.81);Pain Pain - Right/Left: Left Pain - part of body: Knee                Time: 1049-1106 OT Time Calculation (min): 17 min Charges:  OT General Charges $OT Visit: 1 Visit OT Evaluation $OT Eval Low Complexity: 1 Low  [facility] MS, OTR/L ascom [phone]  05/09/22, 1:09 PM

## 2022-05-10 DIAGNOSIS — K219 Gastro-esophageal reflux disease without esophagitis: Secondary | ICD-10-CM | POA: Diagnosis not present

## 2022-05-10 DIAGNOSIS — I480 Paroxysmal atrial fibrillation: Secondary | ICD-10-CM | POA: Diagnosis not present

## 2022-05-10 DIAGNOSIS — I10 Essential (primary) hypertension: Secondary | ICD-10-CM | POA: Diagnosis not present

## 2022-05-10 DIAGNOSIS — F419 Anxiety disorder, unspecified: Secondary | ICD-10-CM | POA: Diagnosis not present

## 2022-05-10 DIAGNOSIS — D649 Anemia, unspecified: Secondary | ICD-10-CM | POA: Diagnosis not present

## 2022-05-10 DIAGNOSIS — Z87440 Personal history of urinary (tract) infections: Secondary | ICD-10-CM | POA: Diagnosis not present

## 2022-05-10 DIAGNOSIS — E279 Disorder of adrenal gland, unspecified: Secondary | ICD-10-CM | POA: Diagnosis not present

## 2022-05-10 DIAGNOSIS — Z8701 Personal history of pneumonia (recurrent): Secondary | ICD-10-CM | POA: Diagnosis not present

## 2022-05-10 DIAGNOSIS — Z471 Aftercare following joint replacement surgery: Secondary | ICD-10-CM | POA: Diagnosis not present

## 2022-05-10 DIAGNOSIS — Z9181 History of falling: Secondary | ICD-10-CM | POA: Diagnosis not present

## 2022-05-10 DIAGNOSIS — E871 Hypo-osmolality and hyponatremia: Secondary | ICD-10-CM | POA: Diagnosis not present

## 2022-05-10 DIAGNOSIS — Z96653 Presence of artificial knee joint, bilateral: Secondary | ICD-10-CM | POA: Diagnosis not present

## 2022-05-10 DIAGNOSIS — D696 Thrombocytopenia, unspecified: Secondary | ICD-10-CM | POA: Diagnosis not present

## 2022-05-10 DIAGNOSIS — H269 Unspecified cataract: Secondary | ICD-10-CM | POA: Diagnosis not present

## 2022-05-10 DIAGNOSIS — M1990 Unspecified osteoarthritis, unspecified site: Secondary | ICD-10-CM | POA: Diagnosis not present

## 2022-05-10 DIAGNOSIS — C021 Malignant neoplasm of border of tongue: Secondary | ICD-10-CM | POA: Diagnosis not present

## 2022-05-10 DIAGNOSIS — E041 Nontoxic single thyroid nodule: Secondary | ICD-10-CM | POA: Diagnosis not present

## 2022-05-10 DIAGNOSIS — Z79899 Other long term (current) drug therapy: Secondary | ICD-10-CM | POA: Diagnosis not present

## 2022-05-10 DIAGNOSIS — C7800 Secondary malignant neoplasm of unspecified lung: Secondary | ICD-10-CM | POA: Diagnosis not present

## 2022-05-10 DIAGNOSIS — N952 Postmenopausal atrophic vaginitis: Secondary | ICD-10-CM | POA: Diagnosis not present

## 2022-05-12 DIAGNOSIS — M1990 Unspecified osteoarthritis, unspecified site: Secondary | ICD-10-CM | POA: Diagnosis not present

## 2022-05-12 DIAGNOSIS — E871 Hypo-osmolality and hyponatremia: Secondary | ICD-10-CM | POA: Diagnosis not present

## 2022-05-12 DIAGNOSIS — I10 Essential (primary) hypertension: Secondary | ICD-10-CM | POA: Diagnosis not present

## 2022-05-12 DIAGNOSIS — Z471 Aftercare following joint replacement surgery: Secondary | ICD-10-CM | POA: Diagnosis not present

## 2022-05-12 DIAGNOSIS — I480 Paroxysmal atrial fibrillation: Secondary | ICD-10-CM | POA: Diagnosis not present

## 2022-05-12 DIAGNOSIS — F419 Anxiety disorder, unspecified: Secondary | ICD-10-CM | POA: Diagnosis not present

## 2022-05-14 DIAGNOSIS — M1990 Unspecified osteoarthritis, unspecified site: Secondary | ICD-10-CM | POA: Diagnosis not present

## 2022-05-14 DIAGNOSIS — Z471 Aftercare following joint replacement surgery: Secondary | ICD-10-CM | POA: Diagnosis not present

## 2022-05-14 DIAGNOSIS — I10 Essential (primary) hypertension: Secondary | ICD-10-CM | POA: Diagnosis not present

## 2022-05-14 DIAGNOSIS — F419 Anxiety disorder, unspecified: Secondary | ICD-10-CM | POA: Diagnosis not present

## 2022-05-14 DIAGNOSIS — E871 Hypo-osmolality and hyponatremia: Secondary | ICD-10-CM | POA: Diagnosis not present

## 2022-05-14 DIAGNOSIS — I480 Paroxysmal atrial fibrillation: Secondary | ICD-10-CM | POA: Diagnosis not present

## 2022-05-15 DIAGNOSIS — E871 Hypo-osmolality and hyponatremia: Secondary | ICD-10-CM | POA: Diagnosis not present

## 2022-05-15 DIAGNOSIS — I480 Paroxysmal atrial fibrillation: Secondary | ICD-10-CM | POA: Diagnosis not present

## 2022-05-15 DIAGNOSIS — F419 Anxiety disorder, unspecified: Secondary | ICD-10-CM | POA: Diagnosis not present

## 2022-05-15 DIAGNOSIS — Z471 Aftercare following joint replacement surgery: Secondary | ICD-10-CM | POA: Diagnosis not present

## 2022-05-15 DIAGNOSIS — I10 Essential (primary) hypertension: Secondary | ICD-10-CM | POA: Diagnosis not present

## 2022-05-15 DIAGNOSIS — M1990 Unspecified osteoarthritis, unspecified site: Secondary | ICD-10-CM | POA: Diagnosis not present

## 2022-05-18 DIAGNOSIS — F419 Anxiety disorder, unspecified: Secondary | ICD-10-CM | POA: Diagnosis not present

## 2022-05-18 DIAGNOSIS — E871 Hypo-osmolality and hyponatremia: Secondary | ICD-10-CM | POA: Diagnosis not present

## 2022-05-18 DIAGNOSIS — I480 Paroxysmal atrial fibrillation: Secondary | ICD-10-CM | POA: Diagnosis not present

## 2022-05-18 DIAGNOSIS — M1990 Unspecified osteoarthritis, unspecified site: Secondary | ICD-10-CM | POA: Diagnosis not present

## 2022-05-18 DIAGNOSIS — Z471 Aftercare following joint replacement surgery: Secondary | ICD-10-CM | POA: Diagnosis not present

## 2022-05-18 DIAGNOSIS — I10 Essential (primary) hypertension: Secondary | ICD-10-CM | POA: Diagnosis not present

## 2022-05-20 ENCOUNTER — Encounter (HOSPITAL_BASED_OUTPATIENT_CLINIC_OR_DEPARTMENT_OTHER): Payer: Self-pay

## 2022-05-20 ENCOUNTER — Emergency Department (HOSPITAL_BASED_OUTPATIENT_CLINIC_OR_DEPARTMENT_OTHER)
Admission: EM | Admit: 2022-05-20 | Discharge: 2022-05-21 | Disposition: A | Discharge status: Home / Self Care | Payer: Medicare Other | Attending: Emergency Medicine | Admitting: Emergency Medicine

## 2022-05-20 DIAGNOSIS — F419 Anxiety disorder, unspecified: Secondary | ICD-10-CM | POA: Diagnosis not present

## 2022-05-20 DIAGNOSIS — E871 Hypo-osmolality and hyponatremia: Secondary | ICD-10-CM | POA: Diagnosis not present

## 2022-05-20 DIAGNOSIS — Z20822 Contact with and (suspected) exposure to covid-19: Secondary | ICD-10-CM | POA: Diagnosis not present

## 2022-05-20 DIAGNOSIS — Z85819 Personal history of malignant neoplasm of unspecified site of lip, oral cavity, and pharynx: Secondary | ICD-10-CM | POA: Diagnosis not present

## 2022-05-20 DIAGNOSIS — M1990 Unspecified osteoarthritis, unspecified site: Secondary | ICD-10-CM | POA: Diagnosis not present

## 2022-05-20 DIAGNOSIS — E876 Hypokalemia: Secondary | ICD-10-CM | POA: Insufficient documentation

## 2022-05-20 DIAGNOSIS — I480 Paroxysmal atrial fibrillation: Secondary | ICD-10-CM | POA: Diagnosis not present

## 2022-05-20 DIAGNOSIS — Z79899 Other long term (current) drug therapy: Secondary | ICD-10-CM | POA: Insufficient documentation

## 2022-05-20 DIAGNOSIS — R110 Nausea: Secondary | ICD-10-CM

## 2022-05-20 DIAGNOSIS — B974 Respiratory syncytial virus as the cause of diseases classified elsewhere: Secondary | ICD-10-CM | POA: Insufficient documentation

## 2022-05-20 DIAGNOSIS — J988 Other specified respiratory disorders: Secondary | ICD-10-CM | POA: Diagnosis not present

## 2022-05-20 DIAGNOSIS — I10 Essential (primary) hypertension: Secondary | ICD-10-CM | POA: Diagnosis not present

## 2022-05-20 DIAGNOSIS — R531 Weakness: Secondary | ICD-10-CM

## 2022-05-20 DIAGNOSIS — B338 Other specified viral diseases: Secondary | ICD-10-CM

## 2022-05-20 DIAGNOSIS — R5383 Other fatigue: Secondary | ICD-10-CM | POA: Diagnosis present

## 2022-05-20 DIAGNOSIS — Z471 Aftercare following joint replacement surgery: Secondary | ICD-10-CM | POA: Diagnosis not present

## 2022-05-20 DIAGNOSIS — R112 Nausea with vomiting, unspecified: Secondary | ICD-10-CM | POA: Diagnosis not present

## 2022-05-20 LAB — CBC WITH DIFFERENTIAL/PLATELET
Abs Immature Granulocytes: 0.01 10*3/uL (ref 0.00–0.07)
Basophils Absolute: 0 10*3/uL (ref 0.0–0.1)
Basophils Relative: 1 %
Eosinophils Absolute: 0.2 10*3/uL (ref 0.0–0.5)
Eosinophils Relative: 5 %
HCT: 33.6 % — ABNORMAL LOW (ref 36.0–46.0)
Hemoglobin: 11.4 g/dL — ABNORMAL LOW (ref 12.0–15.0)
Immature Granulocytes: 0 %
Lymphocytes Relative: 16 %
Lymphs Abs: 0.7 10*3/uL (ref 0.7–4.0)
MCH: 30.5 pg (ref 26.0–34.0)
MCHC: 33.9 g/dL (ref 30.0–36.0)
MCV: 89.8 fL (ref 80.0–100.0)
Monocytes Absolute: 0.4 10*3/uL (ref 0.1–1.0)
Monocytes Relative: 9 %
Neutro Abs: 3 10*3/uL (ref 1.7–7.7)
Neutrophils Relative %: 69 %
Platelets: 341 10*3/uL (ref 150–400)
RBC: 3.74 MIL/uL — ABNORMAL LOW (ref 3.87–5.11)
RDW: 12.7 % (ref 11.5–15.5)
WBC: 4.3 10*3/uL (ref 4.0–10.5)
nRBC: 0 % (ref 0.0–0.2)

## 2022-05-20 LAB — COMPREHENSIVE METABOLIC PANEL
ALT: 25 U/L (ref 0–44)
AST: 22 U/L (ref 15–41)
Albumin: 4.2 g/dL (ref 3.5–5.0)
Alkaline Phosphatase: 103 U/L (ref 38–126)
Anion gap: 11 (ref 5–15)
BUN: 7 mg/dL — ABNORMAL LOW (ref 8–23)
CO2: 24 mmol/L (ref 22–32)
Calcium: 9.5 mg/dL (ref 8.9–10.3)
Chloride: 94 mmol/L — ABNORMAL LOW (ref 98–111)
Creatinine, Ser: 0.72 mg/dL (ref 0.44–1.00)
GFR, Estimated: >60 mL/min (ref >60)
Glucose, Bld: 124 mg/dL — ABNORMAL HIGH (ref 70–99)
Potassium: 4 mmol/L (ref 3.5–5.1)
Sodium: 129 mmol/L — ABNORMAL LOW (ref 135–145)
Total Bilirubin: 0.4 mg/dL (ref 0.3–1.2)
Total Protein: 7.3 g/dL (ref 6.5–8.1)

## 2022-05-20 LAB — RESP PANEL BY RT-PCR (RSV, FLU A&B, COVID)  RVPGX2
Influenza A by PCR: NEGATIVE
Influenza B by PCR: NEGATIVE
Resp Syncytial Virus by PCR: POSITIVE — AB
SARS Coronavirus 2 by RT PCR: NEGATIVE

## 2022-05-20 NOTE — ED Notes (Signed)
Pt ambulated well to the bathroom with her cane.

## 2022-05-20 NOTE — ED Triage Notes (Signed)
Pt states has been very nauseous since this am @ 1 No vomiting but very weak Hx of hyponatremia Recent knee sx on 05/08/22

## 2022-05-21 DIAGNOSIS — J988 Other specified respiratory disorders: Secondary | ICD-10-CM | POA: Diagnosis not present

## 2022-05-21 MED ORDER — ONDANSETRON 4 MG PO TBDP
8.0000 mg | ORAL_TABLET | Freq: Once | ORAL | Status: AC
  Administered 2022-05-21: 8 mg via ORAL
  Filled 2022-05-21: qty 2

## 2022-05-21 MED ORDER — ONDANSETRON 8 MG PO TBDP: ORAL_TABLET | ORAL | 0 refills | Status: DC

## 2022-05-21 NOTE — ED Provider Notes (Signed)
[facility] Provider Note   CSN: [id number] Arrival date & time: 05/20/22  2055     History  Chief Complaint  Patient presents with   Nausea    Erika [name] is a 77 y.o. female.  Patient is a 77 year old female with past medical history of hypertension, paroxysmal atrial fibrillation, hyperlipidemia, GERD, and oral cancer.  Patient presenting today with complaints of weakness, nausea, and fatigue.  This started earlier today and is worsening.  She reports not being able to take her medications secondary to nausea.  She denies any fevers or chills, but does report having some cough.  Patient also underwent total knee replacement 10 days ago and is doing well otherwise.  She denies any drainage or pus coming from the wound.  She also reports a prior admission in the past for hyponatremia and is concerned this may be happening again.  The history is provided by the patient.       Home Medications Prior to Admission medications   Medication Sig Start Date End Date Taking? Authorizing Provider  acetaminophen (TYLENOL) 500 MG tablet Take 1,000 mg by mouth every 6 (six) hours as needed for moderate pain or headache.    [provider]  amLODipine (NORVASC) 5 MG tablet Take 1 tablet (5 mg total) by mouth daily. Please make overdue appt with Dr. Tamala Julian before anymore refills. 3rd and Final Attempt Patient taking differently: Take 5 mg by mouth at bedtime. Please make overdue appt with Dr. Tamala Julian before anymore refills. 3rd and Final Attempt 07/17/19   Belva Crome, MD  Calcium Carb-Cholecalciferol (CALCIUM 600 + D PO) Take 2 tablets by mouth daily.    [provider]  Carboxymethylcellulose Sodium (ARTIFICIAL TEARS OP) Place 1 drop into both eyes daily as needed (dry eyes).    [provider]  celecoxib (CELEBREX) 200 MG capsule Take 1 capsule (200 mg total) by mouth 2 (two) times daily. 05/09/22   Lattie Corns, PA-C   Cranberry 250 MG CAPS See admin instructions.    [provider]  diclofenac Sodium (VOLTAREN) 1 % GEL Apply 1 application topically 4 (four) times daily as needed (pain).    [provider]  enoxaparin (LOVENOX) 40 MG/0.4ML injection Inject 0.4 mLs (40 mg total) into the skin daily. 05/09/22   Lattie Corns, PA-C  esomeprazole (NEXIUM) 40 MG capsule Take 40 mg by mouth daily. 01/20/18   [provider]  Fiber Adult Gummies 2 g CHEW See admin instructions.    [provider]  fluticasone (FLONASE) 50 MCG/ACT nasal spray INSTILL 2 SPRAYS IN EACH NOSTRIL EVERY DAY. Patient taking differently: Place 2 sprays into both nostrils daily. 09/05/13   Clance, Armando Reichert, MD  LIDOCAINE PAIN RELIEF EX Apply 1 Application topically as needed. Roll on    [provider]  loratadine (CLARITIN) 10 MG tablet Take 10 mg by mouth daily.    [provider]  losartan (COZAAR) 100 MG tablet Take 1 tablet (100 mg total) by mouth daily. Please make overdue appt with Dr. Tamala Julian before anymore refills. 2nd attempt 08/01/19   Belva Crome, MD  metoprolol tartrate (LOPRESSOR) 25 MG tablet Take 25 mg by mouth 2 (two) times daily.    [provider]  Multiple Vitamin (MULTIVITAMIN WITH MINERALS) TABS tablet Take 1 tablet by mouth daily.    [provider]  Multiple Vitamins-Minerals (PRESERVISION AREDS 2 PO) Take 1 tablet by mouth daily.  [provider]  nitrofurantoin (MACRODANTIN) 50 MG capsule Take 50 mg by mouth at bedtime.     [provider]  ondansetron (ZOFRAN) 4 MG tablet Take 1 tablet (4 mg total) by mouth every 6 (six) hours as needed for nausea. 05/09/22   Lattie Corns, PA-C  oxyCODONE (OXY IR/ROXICODONE) 5 MG immediate release tablet Take 1 tablet (5 mg total) by mouth every 4 (four) hours as needed for moderate pain (pain score 4-6). 05/09/22   Lattie Corns, PA-C  Probiotic Product (RA PROBIOTIC GUMMIES) CHEW  Chew 2 capsules by mouth daily.    [provider]  simvastatin (ZOCOR) 20 MG tablet Take 20 mg by mouth at bedtime.     [provider]  traMADol (ULTRAM) 50 MG tablet Take 1-2 tablets (50-100 mg total) by mouth every 4 (four) hours as needed for moderate pain. 05/09/22   Lattie Corns, PA-C      Allergies    Hydrochlorothiazide, Atorvastatin, Codeine, and Strawberry flavor    Review of Systems   Review of Systems  All other systems reviewed and are negative.   Physical Exam Updated Vital Signs BP (!) 172/76   Pulse 75   Temp 98.3 F (36.8 C)   Resp 20   Ht '5\' 3"'$  (1.6 m)   Wt 68.9 kg   SpO2 96%   BMI 26.91 kg/m  Physical Exam Vitals and nursing note reviewed.  Constitutional:      General: She is not in acute distress.    Appearance: She is well-developed. She is not diaphoretic.  HENT:     Head: Normocephalic and atraumatic.  Cardiovascular:     Rate and Rhythm: Normal rate and regular rhythm.     Heart sounds: No murmur heard.    No friction rub. No gallop.  Pulmonary:     Effort: Pulmonary effort is normal. No respiratory distress.     Breath sounds: Normal breath sounds. No wheezing.  Abdominal:     General: Bowel sounds are normal. There is no distension.     Palpations: Abdomen is soft.     Tenderness: There is no abdominal tenderness.  Musculoskeletal:        General: Normal range of motion.     Cervical back: Normal range of motion and neck supple.  Skin:    General: Skin is warm and dry.  Neurological:     General: No focal deficit present.     Mental Status: She is alert and oriented to person, place, and time.     ED Results / Procedures / Treatments   Labs (all labs ordered are listed, but only abnormal results are displayed) Labs Reviewed  RESP PANEL BY RT-PCR (RSV, FLU A&B, COVID)  [id number] - Abnormal; Notable for the following components:      Result Value   Resp Syncytial Virus by PCR POSITIVE (*)    All other  components within normal limits  CBC WITH DIFFERENTIAL/PLATELET - Abnormal; Notable for the following components:   RBC 3.74 (*)    Hemoglobin 11.4 (*)    HCT 33.6 (*)    All other components within normal limits  COMPREHENSIVE METABOLIC PANEL - Abnormal; Notable for the following components:   Sodium 129 (*)    Chloride 94 (*)    Glucose, Bld 124 (*)    BUN 7 (*)    All other components within normal limits    EKG None  Radiology No results found.  Procedures Procedures  Medications Ordered in ED Medications  ondansetron (ZOFRAN-ODT) disintegrating tablet 8 mg (has no administration in time range)    ED Course/ Medical Decision Making/ A&P  Patient presenting with complaints of nausea and weakness as described in the HPI.  She arrives here with stable vital signs and is afebrile.  There is no hypoxia.  Physical examination is basically unremarkable.  Workup initiated including CBC and metabolic panel.  She does have a slightly low potassium of 129, but is otherwise unremarkable.  Nasal swab is negative for COVID and flu, but is positive for RSV.    I suspect RSV to be the etiology of her symptoms as nothing else has returned abnormal.  Patient will be given ODT Zofran and is to follow-up with primary doctor if not improving.  Final Clinical Impression(s) / ED Diagnoses Final diagnoses:  None    Rx / DC Orders ED Discharge Orders     None         Veryl Speak, MD 05/21/22 [phone]

## 2022-05-21 NOTE — Discharge Instructions (Signed)
Begin taking Zofran as prescribed as needed for nausea.  Continue other medications as previously prescribed.  Return to the emergency department if your symptoms significantly worsen or change.

## 2022-05-22 DIAGNOSIS — K219 Gastro-esophageal reflux disease without esophagitis: Secondary | ICD-10-CM | POA: Diagnosis not present

## 2022-05-22 DIAGNOSIS — M1990 Unspecified osteoarthritis, unspecified site: Secondary | ICD-10-CM | POA: Diagnosis not present

## 2022-05-22 DIAGNOSIS — I10 Essential (primary) hypertension: Secondary | ICD-10-CM | POA: Diagnosis not present

## 2022-05-22 DIAGNOSIS — F419 Anxiety disorder, unspecified: Secondary | ICD-10-CM | POA: Diagnosis not present

## 2022-05-22 DIAGNOSIS — I480 Paroxysmal atrial fibrillation: Secondary | ICD-10-CM | POA: Diagnosis not present

## 2022-05-22 DIAGNOSIS — M810 Age-related osteoporosis without current pathological fracture: Secondary | ICD-10-CM | POA: Diagnosis not present

## 2022-05-22 DIAGNOSIS — Z471 Aftercare following joint replacement surgery: Secondary | ICD-10-CM | POA: Diagnosis not present

## 2022-05-22 DIAGNOSIS — E871 Hypo-osmolality and hyponatremia: Secondary | ICD-10-CM | POA: Diagnosis not present

## 2022-05-22 DIAGNOSIS — E785 Hyperlipidemia, unspecified: Secondary | ICD-10-CM | POA: Diagnosis not present

## 2022-05-26 DIAGNOSIS — M1990 Unspecified osteoarthritis, unspecified site: Secondary | ICD-10-CM | POA: Diagnosis not present

## 2022-05-26 DIAGNOSIS — I480 Paroxysmal atrial fibrillation: Secondary | ICD-10-CM | POA: Diagnosis not present

## 2022-05-26 DIAGNOSIS — F419 Anxiety disorder, unspecified: Secondary | ICD-10-CM | POA: Diagnosis not present

## 2022-05-26 DIAGNOSIS — E871 Hypo-osmolality and hyponatremia: Secondary | ICD-10-CM | POA: Diagnosis not present

## 2022-05-26 DIAGNOSIS — Z471 Aftercare following joint replacement surgery: Secondary | ICD-10-CM | POA: Diagnosis not present

## 2022-05-26 DIAGNOSIS — I10 Essential (primary) hypertension: Secondary | ICD-10-CM | POA: Diagnosis not present

## 2022-05-27 DIAGNOSIS — I10 Essential (primary) hypertension: Secondary | ICD-10-CM | POA: Diagnosis not present

## 2022-05-27 DIAGNOSIS — M1990 Unspecified osteoarthritis, unspecified site: Secondary | ICD-10-CM | POA: Diagnosis not present

## 2022-05-27 DIAGNOSIS — Z471 Aftercare following joint replacement surgery: Secondary | ICD-10-CM | POA: Diagnosis not present

## 2022-05-27 DIAGNOSIS — I480 Paroxysmal atrial fibrillation: Secondary | ICD-10-CM | POA: Diagnosis not present

## 2022-05-27 DIAGNOSIS — F419 Anxiety disorder, unspecified: Secondary | ICD-10-CM | POA: Diagnosis not present

## 2022-05-27 DIAGNOSIS — E871 Hypo-osmolality and hyponatremia: Secondary | ICD-10-CM | POA: Diagnosis not present

## 2022-05-28 DIAGNOSIS — E871 Hypo-osmolality and hyponatremia: Secondary | ICD-10-CM | POA: Diagnosis not present

## 2022-05-28 DIAGNOSIS — I10 Essential (primary) hypertension: Secondary | ICD-10-CM | POA: Diagnosis not present

## 2022-05-28 DIAGNOSIS — F419 Anxiety disorder, unspecified: Secondary | ICD-10-CM | POA: Diagnosis not present

## 2022-05-28 DIAGNOSIS — I480 Paroxysmal atrial fibrillation: Secondary | ICD-10-CM | POA: Diagnosis not present

## 2022-05-28 DIAGNOSIS — Z471 Aftercare following joint replacement surgery: Secondary | ICD-10-CM | POA: Diagnosis not present

## 2022-05-28 DIAGNOSIS — M1990 Unspecified osteoarthritis, unspecified site: Secondary | ICD-10-CM | POA: Diagnosis not present

## 2022-05-29 DIAGNOSIS — Z471 Aftercare following joint replacement surgery: Secondary | ICD-10-CM | POA: Diagnosis not present

## 2022-06-02 ENCOUNTER — Other Ambulatory Visit: Payer: Self-pay | Admitting: Family Medicine

## 2022-06-02 ENCOUNTER — Ambulatory Visit
Admission: RE | Admit: 2022-06-02 | Discharge: 2022-06-02 | Disposition: A | Discharge status: Home / Self Care | Payer: Medicare Other | Source: Ambulatory Visit | Attending: Family Medicine | Admitting: Family Medicine

## 2022-06-02 DIAGNOSIS — R6883 Chills (without fever): Secondary | ICD-10-CM

## 2022-06-02 DIAGNOSIS — R509 Fever, unspecified: Secondary | ICD-10-CM | POA: Diagnosis not present

## 2022-06-02 DIAGNOSIS — E871 Hypo-osmolality and hyponatremia: Secondary | ICD-10-CM | POA: Diagnosis not present

## 2022-06-02 DIAGNOSIS — R059 Cough, unspecified: Secondary | ICD-10-CM | POA: Diagnosis not present

## 2022-06-03 DIAGNOSIS — M1990 Unspecified osteoarthritis, unspecified site: Secondary | ICD-10-CM | POA: Diagnosis not present

## 2022-06-03 DIAGNOSIS — E871 Hypo-osmolality and hyponatremia: Secondary | ICD-10-CM | POA: Diagnosis not present

## 2022-06-03 DIAGNOSIS — I480 Paroxysmal atrial fibrillation: Secondary | ICD-10-CM | POA: Diagnosis not present

## 2022-06-03 DIAGNOSIS — Z471 Aftercare following joint replacement surgery: Secondary | ICD-10-CM | POA: Diagnosis not present

## 2022-06-03 DIAGNOSIS — F419 Anxiety disorder, unspecified: Secondary | ICD-10-CM | POA: Diagnosis not present

## 2022-06-03 DIAGNOSIS — I10 Essential (primary) hypertension: Secondary | ICD-10-CM | POA: Diagnosis not present

## 2022-06-04 DIAGNOSIS — E871 Hypo-osmolality and hyponatremia: Secondary | ICD-10-CM | POA: Diagnosis not present

## 2022-06-04 DIAGNOSIS — M1990 Unspecified osteoarthritis, unspecified site: Secondary | ICD-10-CM | POA: Diagnosis not present

## 2022-06-04 DIAGNOSIS — I10 Essential (primary) hypertension: Secondary | ICD-10-CM | POA: Diagnosis not present

## 2022-06-04 DIAGNOSIS — F419 Anxiety disorder, unspecified: Secondary | ICD-10-CM | POA: Diagnosis not present

## 2022-06-04 DIAGNOSIS — C029 Malignant neoplasm of tongue, unspecified: Secondary | ICD-10-CM | POA: Diagnosis not present

## 2022-06-04 DIAGNOSIS — I480 Paroxysmal atrial fibrillation: Secondary | ICD-10-CM | POA: Diagnosis not present

## 2022-06-04 DIAGNOSIS — Z471 Aftercare following joint replacement surgery: Secondary | ICD-10-CM | POA: Diagnosis not present

## 2022-06-05 DIAGNOSIS — E871 Hypo-osmolality and hyponatremia: Secondary | ICD-10-CM | POA: Diagnosis not present

## 2022-06-05 DIAGNOSIS — F419 Anxiety disorder, unspecified: Secondary | ICD-10-CM | POA: Diagnosis not present

## 2022-06-05 DIAGNOSIS — Z471 Aftercare following joint replacement surgery: Secondary | ICD-10-CM | POA: Diagnosis not present

## 2022-06-05 DIAGNOSIS — I480 Paroxysmal atrial fibrillation: Secondary | ICD-10-CM | POA: Diagnosis not present

## 2022-06-05 DIAGNOSIS — I10 Essential (primary) hypertension: Secondary | ICD-10-CM | POA: Diagnosis not present

## 2022-06-05 DIAGNOSIS — M1990 Unspecified osteoarthritis, unspecified site: Secondary | ICD-10-CM | POA: Diagnosis not present

## 2022-06-08 DIAGNOSIS — M25562 Pain in left knee: Secondary | ICD-10-CM | POA: Diagnosis not present

## 2022-06-08 DIAGNOSIS — M25462 Effusion, left knee: Secondary | ICD-10-CM | POA: Diagnosis not present

## 2022-06-12 DIAGNOSIS — M25462 Effusion, left knee: Secondary | ICD-10-CM | POA: Diagnosis not present

## 2022-06-12 DIAGNOSIS — M25562 Pain in left knee: Secondary | ICD-10-CM | POA: Diagnosis not present

## 2022-06-14 ENCOUNTER — Emergency Department (HOSPITAL_BASED_OUTPATIENT_CLINIC_OR_DEPARTMENT_OTHER): Payer: Medicare Other | Admitting: Radiology

## 2022-06-14 ENCOUNTER — Other Ambulatory Visit: Payer: Self-pay

## 2022-06-14 ENCOUNTER — Encounter (HOSPITAL_BASED_OUTPATIENT_CLINIC_OR_DEPARTMENT_OTHER): Payer: Self-pay | Admitting: Emergency Medicine

## 2022-06-14 ENCOUNTER — Emergency Department (HOSPITAL_BASED_OUTPATIENT_CLINIC_OR_DEPARTMENT_OTHER)
Admission: EM | Admit: 2022-06-14 | Discharge: 2022-06-14 | Disposition: A | Discharge status: Home / Self Care | Payer: Medicare Other | Attending: Emergency Medicine | Admitting: Emergency Medicine

## 2022-06-14 DIAGNOSIS — R079 Chest pain, unspecified: Secondary | ICD-10-CM | POA: Diagnosis not present

## 2022-06-14 DIAGNOSIS — M545 Low back pain, unspecified: Secondary | ICD-10-CM

## 2022-06-14 DIAGNOSIS — R531 Weakness: Secondary | ICD-10-CM | POA: Diagnosis not present

## 2022-06-14 DIAGNOSIS — L270 Generalized skin eruption due to drugs and medicaments taken internally: Secondary | ICD-10-CM | POA: Diagnosis not present

## 2022-06-14 DIAGNOSIS — E871 Hypo-osmolality and hyponatremia: Secondary | ICD-10-CM | POA: Diagnosis not present

## 2022-06-14 DIAGNOSIS — Z1152 Encounter for screening for COVID-19: Secondary | ICD-10-CM | POA: Insufficient documentation

## 2022-06-14 LAB — RESP PANEL BY RT-PCR (RSV, FLU A&B, COVID)  RVPGX2
Influenza A by PCR: NEGATIVE
Influenza B by PCR: NEGATIVE
Resp Syncytial Virus by PCR: NEGATIVE
SARS Coronavirus 2 by RT PCR: NEGATIVE

## 2022-06-14 LAB — BASIC METABOLIC PANEL
Anion gap: 10 (ref 5–15)
BUN: 12 mg/dL (ref 8–23)
CO2: 22 mmol/L (ref 22–32)
Calcium: 9.4 mg/dL (ref 8.9–10.3)
Chloride: 94 mmol/L — ABNORMAL LOW (ref 98–111)
Creatinine, Ser: 0.55 mg/dL (ref 0.44–1.00)
GFR, Estimated: >60 mL/min (ref >60)
Glucose, Bld: 110 mg/dL — ABNORMAL HIGH (ref 70–99)
Potassium: 4.3 mmol/L (ref 3.5–5.1)
Sodium: 126 mmol/L — ABNORMAL LOW (ref 135–145)

## 2022-06-14 LAB — CBC WITH DIFFERENTIAL/PLATELET
Abs Immature Granulocytes: 0.01 10*3/uL (ref 0.00–0.07)
Basophils Absolute: 0 10*3/uL (ref 0.0–0.1)
Basophils Relative: 0 %
Eosinophils Absolute: 0.3 10*3/uL (ref 0.0–0.5)
Eosinophils Relative: 9 %
HCT: 33.8 % — ABNORMAL LOW (ref 36.0–46.0)
Hemoglobin: 11.8 g/dL — ABNORMAL LOW (ref 12.0–15.0)
Immature Granulocytes: 0 %
Lymphocytes Relative: 13 %
Lymphs Abs: 0.5 10*3/uL — ABNORMAL LOW (ref 0.7–4.0)
MCH: 30.5 pg (ref 26.0–34.0)
MCHC: 34.9 g/dL (ref 30.0–36.0)
MCV: 87.3 fL (ref 80.0–100.0)
Monocytes Absolute: 0.3 10*3/uL (ref 0.1–1.0)
Monocytes Relative: 7 %
Neutro Abs: 2.8 10*3/uL (ref 1.7–7.7)
Neutrophils Relative %: 71 %
Platelets: 224 10*3/uL (ref 150–400)
RBC: 3.87 MIL/uL (ref 3.87–5.11)
RDW: 12.8 % (ref 11.5–15.5)
WBC: 3.9 10*3/uL — ABNORMAL LOW (ref 4.0–10.5)
nRBC: 0 % (ref 0.0–0.2)

## 2022-06-14 LAB — TROPONIN I (HIGH SENSITIVITY)
Troponin I (High Sensitivity): 3 ng/L (ref <18)
Troponin I (High Sensitivity): 4 ng/L (ref <18)

## 2022-06-14 LAB — URINALYSIS, ROUTINE W REFLEX MICROSCOPIC
Bilirubin Urine: NEGATIVE
Glucose, UA: NEGATIVE mg/dL
Hgb urine dipstick: NEGATIVE
Ketones, ur: NEGATIVE mg/dL
Leukocytes,Ua: NEGATIVE
Nitrite: NEGATIVE
Protein, ur: NEGATIVE mg/dL
Specific Gravity, Urine: 1.009 (ref 1.005–1.030)
pH: 6 (ref 5.0–8.0)

## 2022-06-14 LAB — PHOSPHORUS: Phosphorus: 3.8 mg/dL (ref 2.5–4.6)

## 2022-06-14 LAB — MAGNESIUM: Magnesium: 1.7 mg/dL (ref 1.7–2.4)

## 2022-06-14 MED ORDER — MORPHINE SULFATE (PF) 2 MG/ML IV SOLN
2.0000 mg | Freq: Once | INTRAVENOUS | Status: AC
  Administered 2022-06-14: 2 mg via INTRAVENOUS
  Filled 2022-06-14: qty 1

## 2022-06-14 MED ORDER — ONDANSETRON HCL 4 MG/2ML IJ SOLN
4.0000 mg | Freq: Once | INTRAMUSCULAR | Status: AC
  Administered 2022-06-14: 4 mg via INTRAVENOUS
  Filled 2022-06-14: qty 2

## 2022-06-14 MED ORDER — SODIUM CHLORIDE 0.9 % IV BOLUS
1000.0000 mL | Freq: Once | INTRAVENOUS | Status: AC
  Administered 2022-06-14: 1000 mL via INTRAVENOUS

## 2022-06-14 MED ORDER — ONDANSETRON 4 MG PO TBDP: 4.0000 mg | ORAL_TABLET | Freq: Four times a day (QID) | ORAL | 0 refills | Status: DC | PRN

## 2022-06-14 MED ORDER — TRAMADOL HCL 50 MG PO TABS: ORAL_TABLET | ORAL | 0 refills | Status: DC

## 2022-06-14 MED ORDER — TRAMADOL HCL 50 MG PO TABS
50.0000 mg | ORAL_TABLET | Freq: Once | ORAL | Status: AC
  Administered 2022-06-14: 50 mg via ORAL
  Filled 2022-06-14: qty 1

## 2022-06-14 NOTE — ED Provider Notes (Signed)
[facility] Provider Note   CSN: [id number] Arrival date & time: 06/14/22  [id number]     History  Chief Complaint  Patient presents with   Back Pain    Erika [name] is a 77 y.o. female.  HPI Patient reports that she has been feeling extremely weak and fatigued for a couple days.  She is also having a lot of low back pain.  She has been undergoing physical therapy for lower extremity strengthening.  She had a left knee replacement in the first half of January.  She reports it seems to be doing very well.  She is going to therapy last week and they had her in a semirecumbent type of exercise machine that had her doing walking against some resistance.  She reports ever since she did that exercise she is been having this low back pain she indicates her very low central sacrum about the top of the SI joints.  She does not have any pain radiating into her legs and no weakness to the legs.  She has been taking some low-dose of ibuprofen and has a prescription for Celebrex although she is not using it at the same time.  She reports she is also being treated at this time for a sinus infection with amoxicillin.  She has not had any fever, productive cough, abdominal pain, vomiting or diarrhea.  She has had significant loss of appetite and is eating and drinking much less.  Patient does have history of some hyponatremia in the past.    Home Medications Prior to Admission medications   Medication Sig Start Date End Date Taking? Authorizing Provider  ondansetron (ZOFRAN-ODT) 4 MG disintegrating tablet Take 1 tablet (4 mg total) by mouth every 6 (six) hours as needed for nausea or vomiting. 06/14/22  Yes Leoncio Hansen, Jeannie Done, MD  traMADol (ULTRAM) 50 MG tablet 1 to 2 tablets every 6 hours as needed for pain. 06/14/22  Yes Charlesetta Shanks, MD  acetaminophen (TYLENOL) 500 MG tablet Take 1,000 mg by mouth every 6 (six) hours as needed for moderate pain or headache.     [provider]  amLODipine (NORVASC) 5 MG tablet Take 1 tablet (5 mg total) by mouth daily. Please make overdue appt with Dr. Tamala Julian before anymore refills. 3rd and Final Attempt Patient taking differently: Take 5 mg by mouth at bedtime. Please make overdue appt with Dr. Tamala Julian before anymore refills. 3rd and Final Attempt 07/17/19   Belva Crome, MD  Calcium Carb-Cholecalciferol (CALCIUM 600 + D PO) Take 2 tablets by mouth daily.    [provider]  Carboxymethylcellulose Sodium (ARTIFICIAL TEARS OP) Place 1 drop into both eyes daily as needed (dry eyes).    [provider]  celecoxib (CELEBREX) 200 MG capsule Take 1 capsule (200 mg total) by mouth 2 (two) times daily. 05/09/22   Lattie Corns, PA-C  Cranberry 250 MG CAPS See admin instructions.    [provider]  diclofenac Sodium (VOLTAREN) 1 % GEL Apply 1 application topically 4 (four) times daily as needed (pain).    [provider]  enoxaparin (LOVENOX) 40 MG/0.4ML injection Inject 0.4 mLs (40 mg total) into the skin daily. 05/09/22   Lattie Corns, PA-C  esomeprazole (NEXIUM) 40 MG capsule Take 40 mg by mouth daily. 01/20/18   [provider]  Fiber Adult Gummies 2 g CHEW See admin instructions.    [provider]  fluticasone (FLONASE) 50 MCG/ACT nasal spray INSTILL  2 SPRAYS IN EACH NOSTRIL EVERY DAY. Patient taking differently: Place 2 sprays into both nostrils daily. 09/05/13   Clance, Armando Reichert, MD  LIDOCAINE PAIN RELIEF EX Apply 1 Application topically as needed. Roll on    [provider]  loratadine (CLARITIN) 10 MG tablet Take 10 mg by mouth daily.    [provider]  losartan (COZAAR) 100 MG tablet Take 1 tablet (100 mg total) by mouth daily. Please make overdue appt with Dr. Tamala Julian before anymore refills. 2nd attempt 08/01/19   Belva Crome, MD  metoprolol tartrate (LOPRESSOR) 25 MG tablet Take 25 mg by mouth 2 (two) times daily.    [provider]  Multiple Vitamin (MULTIVITAMIN WITH MINERALS) TABS tablet Take 1 tablet by mouth daily.    [provider]  Multiple Vitamins-Minerals (PRESERVISION AREDS 2 PO) Take 1 tablet by mouth daily.    [provider]  nitrofurantoin (MACRODANTIN) 50 MG capsule Take 50 mg by mouth at bedtime.     [provider]  ondansetron (ZOFRAN) 4 MG tablet Take 1 tablet (4 mg total) by mouth every 6 (six) hours as needed for nausea. 05/09/22   Lattie Corns, PA-C  ondansetron (ZOFRAN-ODT) 8 MG disintegrating tablet 54m ODT q4 hours prn nausea 05/21/22   DVeryl Speak MD  oxyCODONE (OXY IR/ROXICODONE) 5 MG immediate release tablet Take 1 tablet (5 mg total) by mouth every 4 (four) hours as needed for moderate pain (pain score 4-6). 05/09/22   MLattie Corns PA-C  Probiotic Product (RA PROBIOTIC GUMMIES) CHEW Chew 2 capsules by mouth daily.    [provider]  simvastatin (ZOCOR) 20 MG tablet Take 20 mg by mouth at bedtime.     [provider]  traMADol (ULTRAM) 50 MG tablet Take 1-2 tablets (50-100 mg total) by mouth every 4 (four) hours as needed for moderate pain. 05/09/22   MLattie Corns PA-C      Allergies    Hydrochlorothiazide, Atorvastatin, Codeine, and Strawberry flavor    Review of Systems   Review of Systems  Physical Exam Updated Vital Signs BP (!) 150/100 (BP Location: Right Arm)   Pulse 86   Temp 98.2 F (36.8 C) (Oral)   Resp 15   SpO2 97%  Physical Exam Constitutional:      Comments: Alert nontoxic.  No respiratory distress.  Well-nourished well-developed.  HENT:     Head: Normocephalic and atraumatic.     Mouth/Throat:     Pharynx: Oropharynx is clear.  Eyes:     Extraocular Movements: Extraocular movements intact.  Cardiovascular:     Rate and Rhythm: Normal rate and regular rhythm.  Pulmonary:     Effort: Pulmonary effort is normal.     Breath sounds: Normal breath sounds.  Abdominal:     General:  There is no distension.     Palpations: Abdomen is soft.     Tenderness: There is no abdominal tenderness. There is no guarding.  Musculoskeletal:        General: No swelling or tenderness.     Right lower leg: No edema.     Left lower leg: No edema.     Comments: Patient has focal discomfort at the central sacrum just at the lumbosacral area.  No CVA tenderness.  No peripheral edema calf soft nontender.  Skin:    General: Skin is warm and dry.     Coloration: Skin is pale.     Findings: Rash present.  Comments: Patient has a macular, pink flat rash on her trunk.  Arms are spared face is spared.  Neurological:     General: No focal deficit present.     Mental Status: She is oriented to person, place, and time.     Coordination: Coordination normal.     Comments: No focal weakness or motor dysfunction.  Psychiatric:        Mood and Affect: Mood normal.     ED Results / Procedures / Treatments   Labs (all labs ordered are listed, but only abnormal results are displayed) Labs Reviewed  URINALYSIS, ROUTINE W REFLEX MICROSCOPIC - Abnormal; Notable for the following components:      Result Value   Color, Urine COLORLESS (*)    All other components within normal limits  CBC WITH DIFFERENTIAL/PLATELET - Abnormal; Notable for the following components:   WBC 3.9 (*)    Hemoglobin 11.8 (*)    HCT 33.8 (*)    Lymphs Abs 0.5 (*)    All other components within normal limits  BASIC METABOLIC PANEL - Abnormal; Notable for the following components:   Sodium 126 (*)    Chloride 94 (*)    Glucose, Bld 110 (*)    All other components within normal limits  RESP PANEL BY RT-PCR (RSV, FLU A&B, COVID)  RVPGX2  MAGNESIUM  PHOSPHORUS  TROPONIN I (HIGH SENSITIVITY)  TROPONIN I (HIGH SENSITIVITY)    EKG EKG Interpretation  Date/Time:  Sunday June 14 2022 11:51:18 EST Ventricular Rate:  64 PR Interval:  184 QRS Duration: 95 QT Interval:  388 QTC Calculation: 401 R Axis:   -6 Text  Interpretation: Sinus rhythm normal, no change from previous Confirmed by Charlesetta Shanks [phone]) on 06/14/2022 12:21:43 PM  Radiology DG Chest 2 View  Result Date: 06/14/2022 CLINICAL DATA:  Provided history: Weakness. Low back pain. EXAM: CHEST - 2 VIEW COMPARISON:  Chest radiographs 06/02/2022 and earlier. FINDINGS: Heart size within normal limits. Aortic atherosclerosis. No appreciable airspace consolidation or pulmonary edema. No evidence of pleural effusion or pneumothorax. No acute osseous abnormality identified. IMPRESSION: No evidence of acute cardiopulmonary abnormality. Aortic Atherosclerosis (ICD10-I70.0). Electronically Signed   By: Kellie Simmering D.O.   On: 06/14/2022 11:41    Procedures Procedures    Medications Ordered in ED Medications  traMADol (ULTRAM) tablet 50 mg (has no administration in time range)  sodium chloride 0.9 % bolus 1,000 mL (0 mLs Intravenous Stopped 06/14/22 1401)  morphine (PF) 2 MG/ML injection 2 mg (2 mg Intravenous Given 06/14/22 1135)  ondansetron (ZOFRAN) injection 4 mg (4 mg Intravenous Given 06/14/22 1134)    ED Course/ Medical Decision Making/ A&P                             Medical Decision Making Amount and/or Complexity of Data Reviewed Labs: ordered. Radiology: ordered.  Risk Prescription drug management.   Patient presents with chief complaint of general weakness and fatigue.  She reports she had been doing physical therapy at the end of last week.  She had a knee replacement and that seems of gone well but now she has pain in her lower back, severe general weakness and decreased appetite.  She was concerned she might be developing sepsis because she has had UTI with sepsis in the past.  No pain burning urgency with urination.  At this time patient is also taking amoxicillin for prior sinus infection.  She has a diffuse  rash of the trunk which she reports actually looks better than it did earlier today.  At this time differential diagnosis  for fatigue, general weakness, back pain and rash includes infectious source such as UTI\metabolic derangement\discitis or osteomyelitis.  Will proceed with broad diagnostic evaluation.  Sodium 126, normal BUN/creatinine normal GFR.  White count 3.9, hemoglobin 11.8.  Respiratory panel negative for COVID influenza or RSV.  Urinalysis normal.   Patient has previously had hyponatremia.  At this time we will repeat hydrate with normal saline.  1 L normal saline ordered.  Patient has tolerated this very well.  She feels much improved after normal saline.  She has been up and ambulatory to the bathroom without dizziness or lightheadedness.  At this time I suspect low sodium secondary to poor oral intake.  Patient has focal low back pain on the bony prominences of the lumbosacral area.  I suspect this is secondary to more recent physical therapy where the patient was in a semirecumbent position doing some loadbearing walking exercise.  No sign that this is UTI, no CVA tenderness, doubt discitis or osteomyelitis.  There is no underlying risk factor for this and there is good explanation for focal musculoskeletal pain.  No radiation into the legs or weakness.  2 view chest x-ray obtained to rule out pneumonia or vascular congestion as possible sources of fatigue and weakness.  I have personally reviewed these images and reviewed radiology report.  No acute findings.  This time with patient significantly improved with rehydration with normal saline and stable diagnostic workup, I do feel she is stable for continued home management.  She is here with her family member and they are comfortable going home increasing oral intake with sodium and fluid resuscitation.  They are aware of the need for very close follow-up at the beginning of the week to monitor the patient's sodium as well as a truncal rash which appears consistent with drug eruption due to amoxicillin.  She does not have any involvement of the face or  the extremities at this time.  They are counseled on signs and symptoms for which to return.          Final Clinical Impression(s) / ED Diagnoses Final diagnoses:  General weakness  Hyponatremia  Acute midline low back pain without sciatica  Drug eruption    Rx / DC Orders ED Discharge Orders          Ordered    traMADol (ULTRAM) 50 MG tablet        06/14/22 1506    ondansetron (ZOFRAN-ODT) 4 MG disintegrating tablet  Every 6 hours PRN        06/14/22 1506              Charlesetta Shanks, MD 06/14/22 1516

## 2022-06-14 NOTE — Discharge Instructions (Signed)
1.  You may take 1-2 tramadol tablets for your back pain every 6 hours.  You might also get an over-the-counter pain patch such as a Salonpas and place it in the area of pain. 2.  You need to increase your salt intake and stay hydrated. 3.  You must see your doctor at the beginning of the week for recheck.  Call tomorrow to be seen within the next couple of days.  You need a recheck on your sodium level and a monitoring of your rash. 4.  Stop taking the amoxicillin.  Suspect you are having a rash from it.  The rash may get worse.  Return to the emergency department immediately if you get any blistering or ulcerating like lesions on your eyelids, mouth or genitals.

## 2022-06-14 NOTE — ED Notes (Signed)
Pt given discharge instructions and reviewed prescriptions. Opportunities given for questions. Pt verbalizes understanding. PIV removed x1. Leanne Chang, RN

## 2022-06-14 NOTE — ED Triage Notes (Addendum)
Pt presents to ED POV. Pt c/o lower back pain that began Friday night. Pt reports that it worsened after Pt on Friday. Pt reports that she has also been septic from UTI and had no s/s from uti's in the past. No GU s/s today. Has been taking amoxicillin x10d for rsv

## 2022-06-16 DIAGNOSIS — R21 Rash and other nonspecific skin eruption: Secondary | ICD-10-CM | POA: Diagnosis not present

## 2022-06-16 DIAGNOSIS — E871 Hypo-osmolality and hyponatremia: Secondary | ICD-10-CM | POA: Diagnosis not present

## 2022-06-18 DIAGNOSIS — Z96652 Presence of left artificial knee joint: Secondary | ICD-10-CM | POA: Diagnosis not present

## 2022-06-19 DIAGNOSIS — M25562 Pain in left knee: Secondary | ICD-10-CM | POA: Diagnosis not present

## 2022-06-19 DIAGNOSIS — M25462 Effusion, left knee: Secondary | ICD-10-CM | POA: Diagnosis not present

## 2022-06-22 ENCOUNTER — Telehealth: Payer: Self-pay

## 2022-06-22 DIAGNOSIS — M25462 Effusion, left knee: Secondary | ICD-10-CM | POA: Diagnosis not present

## 2022-06-22 DIAGNOSIS — M25562 Pain in left knee: Secondary | ICD-10-CM | POA: Diagnosis not present

## 2022-06-22 NOTE — Telephone Encounter (Signed)
     Patient  visit on 06/14/2022  at [facility] was for back pain.  Have you been able to follow up with your primary care physician? Saw PCP last week and has an appointment 06/23/22.  The patient was or was not able to obtain any needed medicine or equipment. Patient was able to obtain medication.  Are there diet recommendations that you are having difficulty following? No  Patient expresses understanding of discharge instructions and education provided has no other needs at this time. Yes   [place] Resource Care Guide   [email or web address]  ?? [id number]   [email or web address]: [email or web address]

## 2022-06-23 DIAGNOSIS — E871 Hypo-osmolality and hyponatremia: Secondary | ICD-10-CM | POA: Diagnosis not present

## 2022-06-24 ENCOUNTER — Other Ambulatory Visit: Payer: Self-pay

## 2022-06-24 ENCOUNTER — Emergency Department (HOSPITAL_BASED_OUTPATIENT_CLINIC_OR_DEPARTMENT_OTHER)
Admission: EM | Admit: 2022-06-24 | Discharge: 2022-06-24 | Disposition: A | Discharge status: Home / Self Care | Payer: Medicare Other | Attending: Emergency Medicine | Admitting: Emergency Medicine

## 2022-06-24 ENCOUNTER — Emergency Department (HOSPITAL_BASED_OUTPATIENT_CLINIC_OR_DEPARTMENT_OTHER): Payer: Medicare Other

## 2022-06-24 ENCOUNTER — Encounter (HOSPITAL_BASED_OUTPATIENT_CLINIC_OR_DEPARTMENT_OTHER): Payer: Self-pay

## 2022-06-24 DIAGNOSIS — R1032 Left lower quadrant pain: Secondary | ICD-10-CM | POA: Diagnosis not present

## 2022-06-24 DIAGNOSIS — Z96659 Presence of unspecified artificial knee joint: Secondary | ICD-10-CM | POA: Diagnosis not present

## 2022-06-24 DIAGNOSIS — K5730 Diverticulosis of large intestine without perforation or abscess without bleeding: Secondary | ICD-10-CM | POA: Diagnosis not present

## 2022-06-24 DIAGNOSIS — K625 Hemorrhage of anus and rectum: Secondary | ICD-10-CM | POA: Diagnosis not present

## 2022-06-24 DIAGNOSIS — K529 Noninfective gastroenteritis and colitis, unspecified: Secondary | ICD-10-CM | POA: Diagnosis not present

## 2022-06-24 DIAGNOSIS — M549 Dorsalgia, unspecified: Secondary | ICD-10-CM | POA: Diagnosis not present

## 2022-06-24 DIAGNOSIS — E871 Hypo-osmolality and hyponatremia: Secondary | ICD-10-CM

## 2022-06-24 LAB — CBC
HCT: 36 % (ref 36.0–46.0)
Hemoglobin: 12.3 g/dL (ref 12.0–15.0)
MCH: 30 pg (ref 26.0–34.0)
MCHC: 34.2 g/dL (ref 30.0–36.0)
MCV: 87.8 fL (ref 80.0–100.0)
Platelets: 467 10*3/uL — ABNORMAL HIGH (ref 150–400)
RBC: 4.1 MIL/uL (ref 3.87–5.11)
RDW: 12.5 % (ref 11.5–15.5)
WBC: 11.6 10*3/uL — ABNORMAL HIGH (ref 4.0–10.5)
nRBC: 0 % (ref 0.0–0.2)

## 2022-06-24 LAB — COMPREHENSIVE METABOLIC PANEL
ALT: 15 U/L (ref 0–44)
AST: 14 U/L — ABNORMAL LOW (ref 15–41)
Albumin: 4 g/dL (ref 3.5–5.0)
Alkaline Phosphatase: 74 U/L (ref 38–126)
Anion gap: 9 (ref 5–15)
BUN: 13 mg/dL (ref 8–23)
CO2: 24 mmol/L (ref 22–32)
Calcium: 9.2 mg/dL (ref 8.9–10.3)
Chloride: 95 mmol/L — ABNORMAL LOW (ref 98–111)
Creatinine, Ser: 0.73 mg/dL (ref 0.44–1.00)
GFR, Estimated: >60 mL/min (ref >60)
Glucose, Bld: 116 mg/dL — ABNORMAL HIGH (ref 70–99)
Potassium: 3.9 mmol/L (ref 3.5–5.1)
Sodium: 128 mmol/L — ABNORMAL LOW (ref 135–145)
Total Bilirubin: 0.5 mg/dL (ref 0.3–1.2)
Total Protein: 7.2 g/dL (ref 6.5–8.1)

## 2022-06-24 LAB — URINALYSIS, ROUTINE W REFLEX MICROSCOPIC
Bilirubin Urine: NEGATIVE
Glucose, UA: NEGATIVE mg/dL
Hgb urine dipstick: NEGATIVE
Ketones, ur: NEGATIVE mg/dL
Leukocytes,Ua: NEGATIVE
Nitrite: NEGATIVE
Specific Gravity, Urine: 1.016 (ref 1.005–1.030)
pH: 7 (ref 5.0–8.0)

## 2022-06-24 LAB — LIPASE, BLOOD: Lipase: 20 U/L (ref 11–51)

## 2022-06-24 MED ORDER — CIPROFLOXACIN HCL 500 MG PO TABS: 500.0000 mg | ORAL_TABLET | Freq: Two times a day (BID) | ORAL | 0 refills | Status: AC

## 2022-06-24 MED ORDER — ONDANSETRON 4 MG PO TBDP
4.0000 mg | ORAL_TABLET | Freq: Once | ORAL | Status: AC
  Administered 2022-06-24: 4 mg via ORAL
  Filled 2022-06-24: qty 1

## 2022-06-24 MED ORDER — IOHEXOL 300 MG/ML  SOLN
75.0000 mL | Freq: Once | INTRAMUSCULAR | Status: AC | PRN
  Administered 2022-06-24: 75 mL via INTRAVENOUS

## 2022-06-24 MED ORDER — CIPROFLOXACIN IN D5W 400 MG/200ML IV SOLN
400.0000 mg | Freq: Once | INTRAVENOUS | Status: AC
  Administered 2022-06-24: 400 mg via INTRAVENOUS
  Filled 2022-06-24: qty 200

## 2022-06-24 MED ORDER — SODIUM CHLORIDE 0.9 % IV BOLUS
1000.0000 mL | Freq: Once | INTRAVENOUS | Status: AC
  Administered 2022-06-24: 1000 mL via INTRAVENOUS

## 2022-06-24 MED ORDER — ONDANSETRON 4 MG PO TBDP: 4.0000 mg | ORAL_TABLET | Freq: Three times a day (TID) | ORAL | 0 refills | Status: DC | PRN

## 2022-06-24 MED ORDER — METRONIDAZOLE 500 MG PO TABS
500.0000 mg | ORAL_TABLET | Freq: Once | ORAL | Status: AC
  Administered 2022-06-24: 500 mg via ORAL
  Filled 2022-06-24: qty 1

## 2022-06-24 MED ORDER — METOCLOPRAMIDE HCL 5 MG/ML IJ SOLN
10.0000 mg | Freq: Once | INTRAMUSCULAR | Status: AC
  Administered 2022-06-24: 10 mg via INTRAVENOUS
  Filled 2022-06-24: qty 2

## 2022-06-24 MED ORDER — METRONIDAZOLE 500 MG/100ML IV SOLN
500.0000 mg | Freq: Once | INTRAVENOUS | Status: AC
  Administered 2022-06-24: 500 mg via INTRAVENOUS
  Filled 2022-06-24: qty 100

## 2022-06-24 MED ORDER — CIPROFLOXACIN HCL 500 MG PO TABS
500.0000 mg | ORAL_TABLET | Freq: Once | ORAL | Status: AC
  Administered 2022-06-24: 500 mg via ORAL
  Filled 2022-06-24: qty 1

## 2022-06-24 MED ORDER — METRONIDAZOLE 500 MG PO TABS: 500.0000 mg | ORAL_TABLET | Freq: Three times a day (TID) | ORAL | 0 refills | Status: AC

## 2022-06-24 NOTE — ED Provider Notes (Addendum)
[facility] Provider Note   CSN: [id number] Arrival date & time: 06/24/22  1556     History  Chief Complaint  Patient presents with   Abdominal Pain    Erika [name] is a 77 y.o. female present emergency department of left lower quadrant abdominal pain.  Patient reports she has been suffering from chronic constipation intermittently had a large bowel movement with loose diarrhea yesterday and this morning with blood in her stool.  She has been generally fatigued as well for the past month.  She had a knee replacement performed in January and was on anticoagulation for 2 weeks afterwards but has been off of anticoagulation since the end of January.  She went to see her PCP today who was concerned that she may have diverticulitis complication advised to come to the ED.  She denies any known history of diverticulosis or diverticulitis.  She is here with her son.  HPI     Home Medications Prior to Admission medications   Medication Sig Start Date End Date Taking? Authorizing Provider  ciprofloxacin (CIPRO) 500 MG tablet Take 1 tablet (500 mg total) by mouth every 12 (twelve) hours for 7 days. 06/25/22 07/02/22 Yes Nimra Puccinelli, Carola Rhine, MD  metroNIDAZOLE (FLAGYL) 500 MG tablet Take 1 tablet (500 mg total) by mouth 3 (three) times daily for 7 days. 06/25/22 07/02/22 Yes Liesl Simons, Carola Rhine, MD  ondansetron (ZOFRAN-ODT) 4 MG disintegrating tablet Take 1 tablet (4 mg total) by mouth every 8 (eight) hours as needed for up to 15 doses for nausea or vomiting. 06/24/22  Yes Nykiah Ma, Carola Rhine, MD  acetaminophen (TYLENOL) 500 MG tablet Take 1,000 mg by mouth every 6 (six) hours as needed for moderate pain or headache.    [provider]  amLODipine (NORVASC) 5 MG tablet Take 1 tablet (5 mg total) by mouth daily. Please make overdue appt with Dr. Tamala Julian before anymore refills. 3rd and Final Attempt Patient taking differently: Take 5 mg by mouth at bedtime. Please  make overdue appt with Dr. Tamala Julian before anymore refills. 3rd and Final Attempt 07/17/19   Belva Crome, MD  Calcium Carb-Cholecalciferol (CALCIUM 600 + D PO) Take 2 tablets by mouth daily.    [provider]  Carboxymethylcellulose Sodium (ARTIFICIAL TEARS OP) Place 1 drop into both eyes daily as needed (dry eyes).    [provider]  celecoxib (CELEBREX) 200 MG capsule Take 1 capsule (200 mg total) by mouth 2 (two) times daily. 05/09/22   Lattie Corns, PA-C  Cranberry 250 MG CAPS See admin instructions.    [provider]  diclofenac Sodium (VOLTAREN) 1 % GEL Apply 1 application topically 4 (four) times daily as needed (pain).    [provider]  enoxaparin (LOVENOX) 40 MG/0.4ML injection Inject 0.4 mLs (40 mg total) into the skin daily. 05/09/22   Lattie Corns, PA-C  esomeprazole (NEXIUM) 40 MG capsule Take 40 mg by mouth daily. 01/20/18   [provider]  Fiber Adult Gummies 2 g CHEW See admin instructions.    [provider]  fluticasone (FLONASE) 50 MCG/ACT nasal spray INSTILL 2 SPRAYS IN EACH NOSTRIL EVERY DAY. Patient taking differently: Place 2 sprays into both nostrils daily. 09/05/13   Clance, Armando Reichert, MD  LIDOCAINE PAIN RELIEF EX Apply 1 Application topically as needed. Roll on    [provider]  loratadine (CLARITIN) 10 MG tablet Take 10 mg by mouth daily.    [provider]  losartan (COZAAR) 100 MG tablet Take 1 tablet (100 mg total) by mouth daily. Please make overdue appt with Dr. Tamala Julian before anymore refills. 2nd attempt 08/01/19   Belva Crome, MD  metoprolol tartrate (LOPRESSOR) 25 MG tablet Take 25 mg by mouth 2 (two) times daily.    [provider]  Multiple Vitamin (MULTIVITAMIN WITH MINERALS) TABS tablet Take 1 tablet by mouth daily.    [provider]  Multiple Vitamins-Minerals (PRESERVISION AREDS 2 PO) Take 1 tablet by mouth daily.    [provider]   nitrofurantoin (MACRODANTIN) 50 MG capsule Take 50 mg by mouth at bedtime.     [provider]  ondansetron (ZOFRAN) 4 MG tablet Take 1 tablet (4 mg total) by mouth every 6 (six) hours as needed for nausea. 05/09/22   Lattie Corns, PA-C  ondansetron (ZOFRAN-ODT) 4 MG disintegrating tablet Take 1 tablet (4 mg total) by mouth every 6 (six) hours as needed for nausea or vomiting. 06/14/22   Charlesetta Shanks, MD  ondansetron (ZOFRAN-ODT) 8 MG disintegrating tablet '8mg'$  ODT q4 hours prn nausea 05/21/22   Veryl Speak, MD  oxyCODONE (OXY IR/ROXICODONE) 5 MG immediate release tablet Take 1 tablet (5 mg total) by mouth every 4 (four) hours as needed for moderate pain (pain score 4-6). 05/09/22   Lattie Corns, PA-C  Probiotic Product (RA PROBIOTIC GUMMIES) CHEW Chew 2 capsules by mouth daily.    [provider]  simvastatin (ZOCOR) 20 MG tablet Take 20 mg by mouth at bedtime.     [provider]  traMADol (ULTRAM) 50 MG tablet Take 1-2 tablets (50-100 mg total) by mouth every 4 (four) hours as needed for moderate pain. 05/09/22   Lattie Corns, PA-C  traMADol (ULTRAM) 50 MG tablet 1 to 2 tablets every 6 hours as needed for pain. 06/14/22   Charlesetta Shanks, MD      Allergies    Hydrochlorothiazide, Morphine, Atorvastatin, Codeine, and Strawberry flavor    Review of Systems   Review of Systems  Physical Exam Updated Vital Signs BP (!) 159/86   Pulse 76   Temp 98.9 F (37.2 C) (Oral)   Resp 15   Ht '5\' 3"'$  (1.6 m)   Wt 57.6 kg   SpO2 95%   BMI 22.50 kg/m  Physical Exam Constitutional:      General: She is not in acute distress. HENT:     Head: Normocephalic and atraumatic.  Eyes:     Conjunctiva/sclera: Conjunctivae normal.     Pupils: Pupils are equal, round, and reactive to light.  Cardiovascular:     Rate and Rhythm: Normal rate and regular rhythm.  Pulmonary:     Effort: Pulmonary effort is normal. No respiratory distress.  Abdominal:      General: There is no distension.     Tenderness: There is abdominal tenderness in the left lower quadrant.  Skin:    General: Skin is warm and dry.  Neurological:     General: No focal deficit present.     Mental Status: She is alert. Mental status is at baseline.  Psychiatric:        Mood and Affect: Mood normal.        Behavior: Behavior normal.     ED Results / Procedures / Treatments   Labs (all labs ordered are listed, but only abnormal results are displayed) Labs Reviewed  COMPREHENSIVE METABOLIC PANEL - Abnormal; Notable for the following components:  Result Value   Sodium 128 (*)    Chloride 95 (*)    Glucose, Bld 116 (*)    AST 14 (*)    All other components within normal limits  CBC - Abnormal; Notable for the following components:   WBC 11.6 (*)    Platelets 467 (*)    All other components within normal limits  URINALYSIS, ROUTINE W REFLEX MICROSCOPIC - Abnormal; Notable for the following components:   Protein, ur TRACE (*)    All other components within normal limits  LIPASE, BLOOD    EKG None  Radiology CT ABDOMEN PELVIS W CONTRAST  Result Date: 06/24/2022 CLINICAL DATA:  Left lower quadrant abdominal pain, bloody stools, diverticulitis, complication suspected EXAM: CT ABDOMEN AND PELVIS WITH CONTRAST TECHNIQUE: Multidetector CT imaging of the abdomen and pelvis was performed using the standard protocol following bolus administration of intravenous contrast. RADIATION DOSE REDUCTION: This exam was performed according to the departmental dose-optimization program which includes automated exposure control, adjustment of the mA and/or kV according to patient size and/or use of iterative reconstruction technique. CONTRAST:  71m OMNIPAQUE IOHEXOL 300 MG/ML  SOLN COMPARISON:  02/14/2019 FINDINGS: Lower chest: No acute abnormality. Hepatobiliary: No solid liver abnormality is seen. No gallstones, gallbladder wall thickening, or biliary dilatation. Pancreas:  Unremarkable. No pancreatic ductal dilatation or surrounding inflammatory changes. Spleen: Normal in size without significant abnormality. Adrenals/Urinary Tract: Adrenal glands are unremarkable. Kidneys are normal, without renal calculi, solid lesion, or hydronephrosis. Bladder is unremarkable. Stomach/Bowel: Stomach is within normal limits. Appendix is not clearly visualized. Pancolonic diverticulosis. Long segment wall thickening and fat stranding involving entirety of the descending and proximal sigmoid colon (series 2, image 40, series 5, image 40). Vascular/Lymphatic: Aortic atherosclerosis. No enlarged abdominal or pelvic lymph nodes. Reproductive: No mass or other significant abnormality. Other: No abdominal wall hernia or abnormality. No ascites. Musculoskeletal: No acute or significant osseous findings. IMPRESSION: Pancolonic diverticulosis. Long segment wall thickening and fat stranding involving the entirety of the descending and proximal sigmoid colon. This may reflect acute diverticulitis or alternately other nonspecific infectious, inflammatory, or ischemic colitis given long segment involvement. No evidence of complicating perforation or abscess. Aortic Atherosclerosis (ICD10-I70.0). Electronically Signed   By: ADelanna AhmadiM.D.   On: 06/24/2022 18:21    Procedures Procedures    Medications Ordered in ED Medications  ciprofloxacin (CIPRO) IVPB 400 mg (has no administration in time range)  metroNIDAZOLE (FLAGYL) IVPB 500 mg (has no administration in time range)  metoCLOPramide (REGLAN) injection 10 mg (has no administration in time range)  iohexol (OMNIPAQUE) 300 MG/ML solution 75 mL (75 mLs Intravenous Contrast Given 06/24/22 1756)  sodium chloride 0.9 % bolus 1,000 mL (0 mLs Intravenous Stopped 06/24/22 1921)  ciprofloxacin (CIPRO) tablet 500 mg (500 mg Oral Given 06/24/22 1841)  metroNIDAZOLE (FLAGYL) tablet 500 mg (500 mg Oral Given 06/24/22 1842)  ondansetron (ZOFRAN-ODT)  disintegrating tablet 4 mg (4 mg Oral Given 06/24/22 1944)    ED Course/ Medical Decision Making/ A&P                             Medical Decision Making Amount and/or Complexity of Data Reviewed Labs: ordered. Radiology: ordered.  Risk Prescription drug management.   This patient presents to the ED with concern for lower abdominal pain, dark stools and blood in stool. This involves an extensive number of treatment options, and is a complaint that carries with it a high risk of complications  and morbidity.  The differential diagnosis includes but is first colitis first diverticulosis versus other  Additional history obtained from patient's son at bedside   I ordered and personally interpreted labs.  The pertinent results include: Minor leukocytosis.  Some chronic hyponatremia sodium 128 which is close to her baseline.  Hemoglobin is within normal limits.  Lipase within normal limits  I ordered imaging studies including CT abdomen pelvis I independently visualized and interpreted imaging which showed inflammatory bowel findings consistent with colitis I agree with the radiologist interpretation   The patient was maintained on a cardiac monitor.  I personally viewed and interpreted the cardiac monitored which showed an underlying rhythm of: Sinus rhythm  I ordered medication including IV saline for hyponatremia, ciprofloxacin and Flagyl ordered for colitis  I have reviewed the patients home medicines and have made adjustments as needed  Test Considered: Low suspicion for mesenteric ischemia at this time.   After the interventions noted above, I reevaluated the patient and found that they have: stayed the same   Dispostion:  After consideration of the diagnostic results and the patients response to treatment, I feel that the patent would benefit from outpatient follow-up with her PCP for recheck of her electrolyte levels.  She will also need follow-up attention with a  gastroenterologist if her symptoms or not improving with antibiotics, as I explained the differential for colitis could include inflammatory bowel disease or malignancy.  She and her son verbalized understanding.  At this time there is no acute indication for hospitalization.  She does not appear acutely anemic, she is not requiring blood transfusion.  She does not appear to have sepsis or bowel perforation.    Addendum, after patient was discharged she had 2 episodes of vomiting, which was within an hour of receiving her antibiotic tablets.  Therefore she was given Zofran and I have requested that she be given antibiotics through an IV as it is not clear that she properly digested them.  We will continue to monitor her, if her nausea improves, she will be discharged.  Zofran was also sent to her pharmacy    Final Clinical Impression(s) / ED Diagnoses Final diagnoses:  Hyponatremia  Colitis    Rx / DC Orders ED Discharge Orders          Ordered    ciprofloxacin (CIPRO) 500 MG tablet  Every 12 hours        06/24/22 1902    metroNIDAZOLE (FLAGYL) 500 MG tablet  3 times daily        06/24/22 1902    ondansetron (ZOFRAN-ODT) 4 MG disintegrating tablet  Every 8 hours PRN        06/24/22 1949              Wyvonnia Dusky, MD 06/24/22 Drema Halon    Wyvonnia Dusky, MD 06/24/22 [phone]

## 2022-06-24 NOTE — ED Notes (Signed)
Patient transported to CT 

## 2022-06-24 NOTE — ED Triage Notes (Signed)
Patient here POV from Home.  Endorses LLQ ABD Pain that began Yesterday Afternoon. Associated with Diarrhea and Some nausea.  No emesis. Noted some Blood after a BM today. Sent by PCP for Assessment/Imaging.   NAD Noted during Triage. A&Ox4. GCS 15. Ambulatory.

## 2022-06-24 NOTE — ED Notes (Addendum)
Pt had worsened nausea, and emesis x 2 when sitting up to bedside, getting ready for d/c. Dr. Ree Kida, orders for IV abx placed. Pt will wait longer in ED for observation

## 2022-06-24 NOTE — ED Notes (Signed)
When charge nurse in to d/c pt., IV was removed then pt. Vomited small amount of emesis. MD ordered IV abx.

## 2022-06-24 NOTE — Discharge Instructions (Addendum)
Please follow-up with your doctor for your low sodium level.  You were given a saline solution and through an IV in the ER.  You will also need to follow-up with a gastroenterologist for the CT findings of colitis.  This means you have inflammation of your bowels.  It is possible this is due to infection, therefore we started you on antibiotics.  However inflammation can also be caused by bowel disease like Crohn's, autoimmune diseases, and even potentially malignancies or cancers.  This is why you need to follow-up with a gastroenterologist.

## 2022-06-25 DIAGNOSIS — M810 Age-related osteoporosis without current pathological fracture: Secondary | ICD-10-CM | POA: Diagnosis not present

## 2022-06-25 DIAGNOSIS — I10 Essential (primary) hypertension: Secondary | ICD-10-CM | POA: Diagnosis not present

## 2022-06-25 DIAGNOSIS — K219 Gastro-esophageal reflux disease without esophagitis: Secondary | ICD-10-CM | POA: Diagnosis not present

## 2022-06-25 DIAGNOSIS — E785 Hyperlipidemia, unspecified: Secondary | ICD-10-CM | POA: Diagnosis not present

## 2022-06-26 DIAGNOSIS — K5792 Diverticulitis of intestine, part unspecified, without perforation or abscess without bleeding: Secondary | ICD-10-CM | POA: Diagnosis not present

## 2022-06-26 DIAGNOSIS — K219 Gastro-esophageal reflux disease without esophagitis: Secondary | ICD-10-CM | POA: Diagnosis not present

## 2022-06-29 DIAGNOSIS — E871 Hypo-osmolality and hyponatremia: Secondary | ICD-10-CM | POA: Diagnosis not present

## 2022-06-29 DIAGNOSIS — K529 Noninfective gastroenteritis and colitis, unspecified: Secondary | ICD-10-CM | POA: Diagnosis not present

## 2022-06-29 DIAGNOSIS — K5790 Diverticulosis of intestine, part unspecified, without perforation or abscess without bleeding: Secondary | ICD-10-CM | POA: Diagnosis not present

## 2022-07-03 DIAGNOSIS — R109 Unspecified abdominal pain: Secondary | ICD-10-CM | POA: Diagnosis not present

## 2022-07-07 DIAGNOSIS — E871 Hypo-osmolality and hyponatremia: Secondary | ICD-10-CM | POA: Diagnosis not present

## 2022-07-08 ENCOUNTER — Telehealth: Payer: Self-pay | Admitting: Adult Health

## 2022-07-08 NOTE — Telephone Encounter (Signed)
Rescheduled appointment per provider admin time. Patient is aware of the changes made to her upcoming appointment. 

## 2022-07-20 ENCOUNTER — Encounter (HOSPITAL_COMMUNITY): Payer: Self-pay

## 2022-07-20 ENCOUNTER — Ambulatory Visit (HOSPITAL_COMMUNITY)
Admission: RE | Admit: 2022-07-20 | Discharge: 2022-07-20 | Disposition: A | Discharge status: Home / Self Care | Payer: Medicare Other | Source: Ambulatory Visit | Attending: Adult Health | Admitting: Adult Health

## 2022-07-20 DIAGNOSIS — J9811 Atelectasis: Secondary | ICD-10-CM | POA: Diagnosis not present

## 2022-07-20 DIAGNOSIS — C021 Malignant neoplasm of border of tongue: Secondary | ICD-10-CM | POA: Diagnosis not present

## 2022-07-20 DIAGNOSIS — R911 Solitary pulmonary nodule: Secondary | ICD-10-CM | POA: Diagnosis not present

## 2022-07-21 ENCOUNTER — Other Ambulatory Visit: Payer: Self-pay

## 2022-07-21 ENCOUNTER — Inpatient Hospital Stay: Payer: Medicare Other | Attending: Adult Health | Admitting: Adult Health

## 2022-07-21 ENCOUNTER — Encounter: Payer: Self-pay | Admitting: Adult Health

## 2022-07-21 VITALS — BP 161/73 | HR 60 | Temp 98.0°F | Resp 18 | Ht 63.0 in | Wt 121.0 lb

## 2022-07-21 DIAGNOSIS — Z79899 Other long term (current) drug therapy: Secondary | ICD-10-CM | POA: Diagnosis not present

## 2022-07-21 DIAGNOSIS — Z923 Personal history of irradiation: Secondary | ICD-10-CM | POA: Insufficient documentation

## 2022-07-21 DIAGNOSIS — K219 Gastro-esophageal reflux disease without esophagitis: Secondary | ICD-10-CM | POA: Insufficient documentation

## 2022-07-21 DIAGNOSIS — C021 Malignant neoplasm of border of tongue: Secondary | ICD-10-CM | POA: Insufficient documentation

## 2022-07-21 NOTE — Progress Notes (Signed)
CLINIC:  Survivorship  REASON FOR VISIT:  Routine follow-up for history of head & neck cancer.  BRIEF ONCOLOGIC HISTORY:  Oncology History  Squamous cell carcinoma of lateral tongue ([place])  02/16/2020 Initial Diagnosis   Tongue cancer ([place])   03/01/2020 Cancer Staging   Staging form: Oral Cavity, AJCC 8th Edition - Pathologic: Stage III (pT3, pN0, cM0) - Signed by Eppie Gibson, MD on 03/15/2020   04/01/2020 - 04/17/2020 Radiation Therapy   Site Technique Total Dose (Gy) Dose per Fx (Gy) Completed Fx Beam Energies  Tongue: HN_L_Tongu IMRT 32.5/50 2.5 13/20 6X        INTERVAL HISTORY:    She underwent CT chest without contrast on July 20, 2022 that demonstrated no mediastinal or hilar mass or adenopathy, no worrisome pulmonary lesions or pulmonary nodules to suggest metastatic disease, new subpleural elongated triangular density at the left lung base likely an area of subpleural atelectasis, stable enlargement of the pulmonary arteries suggesting pulmonary hypertension, and a stable 18 mm thyroid nodule unchanged from prior.  She also has aortic atherosclerosis.   He notes that she is still struggling with swallowing on the left side.  She uses her right side to swallow.  She continues to see Dr. Constance Holster regularly and sees him in 4 more months.  She had typically been seeing her dentist every 4 months however this is on hold until she has her persistent reflux evaluated with GI because after her dental appointment she is required to take amoxicillin and due to her GI issues of reflux they do not want her to do this until they understand what is going on with her stomach.  Because of these issues she has lost weight because she has been on a modified diet and she is somewhat tired of the contents of this diet.  She sees her primary care provider every 6 months with labs.  -Last ENT visit:  06/04/2022 -Last Rad Onc visit: 06/2021     ADDITIONAL REVIEW OF SYSTEMS:  Review of Systems   Constitutional:  Negative for appetite change, chills, fatigue, fever and unexpected weight change.  HENT:   Negative for hearing loss, lump/mass and trouble swallowing.   Eyes:  Negative for eye problems and icterus.  Respiratory:  Negative for chest tightness, cough and shortness of breath.   Cardiovascular:  Negative for chest pain, leg swelling and palpitations.  Gastrointestinal:  Negative for abdominal distention, abdominal pain, constipation, diarrhea, nausea and vomiting.  Endocrine: Negative for hot flashes.  Genitourinary:  Negative for difficulty urinating.   Musculoskeletal:  Negative for arthralgias.  Skin:  Negative for itching and rash.  Neurological:  Negative for dizziness, extremity weakness, headaches and numbness.  Hematological:  Negative for adenopathy. Does not bruise/bleed easily.  Psychiatric/Behavioral:  Negative for depression. The patient is not nervous/anxious.        CURRENT MEDICATIONS:  Current Outpatient Medications on File Prior to Visit  Medication Sig Dispense Refill   acetaminophen (TYLENOL) 500 MG tablet Take 1,000 mg by mouth every 6 (six) hours as needed for moderate pain or headache.     amLODipine (NORVASC) 5 MG tablet Take 1 tablet (5 mg total) by mouth daily. Please make overdue appt with Dr. Tamala Julian before anymore refills. 3rd and Final Attempt (Patient taking differently: Take 5 mg by mouth at bedtime. Please make overdue appt with Dr. Tamala Julian before anymore refills. 3rd and Final Attempt) 15 tablet 0   Calcium Carb-Cholecalciferol (CALCIUM 600 + D PO) Take 2 tablets by  mouth daily.     Carboxymethylcellulose Sodium (ARTIFICIAL TEARS OP) Place 1 drop into both eyes daily as needed (dry eyes).     celecoxib (CELEBREX) 200 MG capsule Take 1 capsule (200 mg total) by mouth 2 (two) times daily. 60 capsule 0   cetirizine (ZYRTEC) 10 MG tablet Take by mouth.     Cranberry 250 MG CAPS See admin instructions.     denosumab (PROLIA) 60 MG/ML SOSY  injection Inject into the skin every 6 (six) months.     dexlansoprazole (DEXILANT) 60 MG capsule Take 1 capsule by mouth daily.     esomeprazole (NEXIUM) 40 MG capsule Take 40 mg by mouth daily.  5   famotidine (PEPCID) 20 MG tablet Take by mouth.     Fiber Adult Gummies 2 g CHEW See admin instructions.     fluticasone (FLONASE) 50 MCG/ACT nasal spray INSTILL 2 SPRAYS IN EACH NOSTRIL EVERY DAY. (Patient taking differently: Place 2 sprays into both nostrils daily.) 16 g 3   LIDOCAINE PAIN RELIEF EX Apply 1 Application topically as needed. Roll on     loratadine (CLARITIN) 10 MG tablet Take 10 mg by mouth daily.     losartan (COZAAR) 100 MG tablet Take 1 tablet (100 mg total) by mouth daily. Please make overdue appt with Dr. Tamala Julian before anymore refills. 2nd attempt 15 tablet 0   metoprolol tartrate (LOPRESSOR) 25 MG tablet Take 25 mg by mouth 2 (two) times daily.     nitrofurantoin (MACRODANTIN) 50 MG capsule Take 50 mg by mouth at bedtime.      Probiotic Product (CULTURELLE PROBIOTICS) CHEW Chew 1 capsule by mouth daily.     Probiotic Product (RA PROBIOTIC GUMMIES) CHEW Chew 2 capsules by mouth daily.     simvastatin (ZOCOR) 20 MG tablet Take 20 mg by mouth at bedtime.      diclofenac Sodium (VOLTAREN) 1 % GEL Apply 1 application topically 4 (four) times daily as needed (pain). (Patient not taking: Reported on 07/21/2022)     Multiple Vitamin (MULTIVITAMIN WITH MINERALS) TABS tablet Take 1 tablet by mouth daily. (Patient not taking: Reported on 07/21/2022)     Multiple Vitamins-Minerals (PRESERVISION AREDS 2 PO) Take 1 tablet by mouth daily. (Patient not taking: Reported on 07/21/2022)     No current facility-administered medications on file prior to visit.    ALLERGIES:  Allergies  Allergen Reactions   Hydrochlorothiazide Other (See Comments)    Brain swelling   Morphine Hives   Atorvastatin Other (See Comments)    cramps   Codeine Nausea And Vomiting   Strawberry Flavor Itching      PHYSICAL EXAM:  Vitals:   07/21/22 1107  BP: (!) 161/73  Pulse: 60  Resp: 18  Temp: 98 F (36.7 C)  SpO2: 100%   Filed Weights   07/21/22 1107  Weight: 121 lb (54.9 kg)   GENERAL: Patient is a well appearing female in no acute distress HEENT:  Sclerae anicteric.  Oropharynx clear and moist. No ulcerations or evidence of oropharyngeal candidiasis. Neck is supple.  NODES:  No cervical, supraclavicular, or axillary lymphadenopathy palpated.  BREAST EXAM:  Deferred. LUNGS:  Clear to auscultation bilaterally.  No wheezes or rhonchi. HEART:  Regular rate and rhythm. No murmur appreciated. ABDOMEN:  Soft, nontender.  Positive, normoactive bowel sounds. No organomegaly palpated. MSK:  No focal spinal tenderness to palpation. Full range of motion bilaterally in the upper extremities. EXTREMITIES:  No peripheral edema.   SKIN:  Clear with no  obvious rashes or skin changes. No nail dyscrasia. NEURO:  Nonfocal. Well oriented.  Appropriate affect.  LABORATORY DATA:  None at this visit.  DIAGNOSTIC IMAGING:  None at this visit.    ASSESSMENT & PLAN:  Erika [name] is a pleasant 77 y.o. female with stage III squamous cell carcinoma of the left lateral tongue status post surgery and radiation that completed in 2021..   1.  Squamous cell carcinoma:  Erika [name] is clinically without evidence of disease or recurrence on physical exam today.     2. Nutritional status: This is somewhat decreased related to her reflux, side effects from the antireflux medication that she is on.  She has follow-up with GI on April 10 for further evaluation and treatment.  Her most recent CT scan demonstrated no evidence of metastatic disease in the chest.  3. At risk for dysphagia: She has chronic dysphagia and manages this well.  She knows to reach out to [place] and we can get her in with speech-language pathology if needed.   4.  At risk for hypothyroidism: This is quite low because of the decreased number of  radiation treatment she received.  She is recommended to undergo annual TSH with her primary care provider when she has her annual lab testing.  5. At risk for tooth decay/dental concerns: After treatment with radiation for head & neck cancers, patients often experience xerostomia which increases their risk of dental caries. Erika [name] was encouraged to see his dentist 3-4 times per year (understandably delayed due to stomach concerns). The patient should also continue to use her fluoride trays daily, as directed by dentistry.  I encouraged her to reach out to her primary care dentist with additional questions or concerns.   6. Health maintenance and wellness promotion: Cancer patients who consume a diet rich in fruits and vegetables have better overall health and decreased risk of cancer recurrence. Erika [name] was encouraged to consume 5-7 servings of fruits and vegetables per day, as tolerated. Erika [name] was also encouraged to engage in moderate exercise for 30 minutes per day most days of the week. She was recommended to f/u with her PCP about other cancer screenings for breast cancer, skin cancer, colon cancer.   7. Support services/counseling: It is not uncommon for this period of the patient's cancer care trajectory to be one of many emotions and stressors. She was encouraged to take advantage of our many other support services programs, support groups, and/or counseling in coping with her new life as a cancer survivor after completing anti-cancer treatment.     Dispo:  -See Dr. Constance Holster in 09/2022 -Return to cancer center to see Survivorship NP in 1 year.   Total encounter time:30 minutes*in face-to-face visit time, chart review, lab review, care coordination, order entry, and documentation of the encounter time.    Wilber Bihari, NP 07/21/22 11:27 AM Medical Oncology and Hematology [facility] [place], [place] [address] Tel. [phone]    Fax.  [phone]  *Total Encounter Time as defined by the Centers for Medicare and Medicaid Services includes, in addition to the face-to-face time of a patient visit (documented in the note above) non-face-to-face time: obtaining and reviewing outside history, ordering and reviewing medications, tests or procedures, care coordination (communications with other health care professionals or caregivers) and documentation in the medical record.

## 2022-07-22 ENCOUNTER — Encounter: Payer: Medicare Other | Admitting: Adult Health

## 2022-08-03 DIAGNOSIS — Z961 Presence of intraocular lens: Secondary | ICD-10-CM | POA: Diagnosis not present

## 2022-08-03 DIAGNOSIS — H353132 Nonexudative age-related macular degeneration, bilateral, intermediate dry stage: Secondary | ICD-10-CM | POA: Diagnosis not present

## 2022-08-03 DIAGNOSIS — H524 Presbyopia: Secondary | ICD-10-CM | POA: Diagnosis not present

## 2022-08-05 DIAGNOSIS — K219 Gastro-esophageal reflux disease without esophagitis: Secondary | ICD-10-CM | POA: Diagnosis not present

## 2022-09-15 DIAGNOSIS — Z96652 Presence of left artificial knee joint: Secondary | ICD-10-CM | POA: Diagnosis not present

## 2022-10-06 DIAGNOSIS — M270 Developmental disorders of jaws: Secondary | ICD-10-CM | POA: Diagnosis not present

## 2022-10-06 DIAGNOSIS — C029 Malignant neoplasm of tongue, unspecified: Secondary | ICD-10-CM | POA: Diagnosis not present

## 2022-10-07 DIAGNOSIS — M810 Age-related osteoporosis without current pathological fracture: Secondary | ICD-10-CM | POA: Diagnosis not present

## 2022-10-21 DIAGNOSIS — C029 Malignant neoplasm of tongue, unspecified: Secondary | ICD-10-CM | POA: Diagnosis not present

## 2022-10-21 DIAGNOSIS — E871 Hypo-osmolality and hyponatremia: Secondary | ICD-10-CM | POA: Diagnosis not present

## 2022-10-21 DIAGNOSIS — I10 Essential (primary) hypertension: Secondary | ICD-10-CM | POA: Diagnosis not present

## 2022-10-21 DIAGNOSIS — E7800 Pure hypercholesterolemia, unspecified: Secondary | ICD-10-CM | POA: Diagnosis not present

## 2022-11-18 DIAGNOSIS — R399 Unspecified symptoms and signs involving the genitourinary system: Secondary | ICD-10-CM | POA: Diagnosis not present

## 2022-11-18 DIAGNOSIS — R030 Elevated blood-pressure reading, without diagnosis of hypertension: Secondary | ICD-10-CM | POA: Diagnosis not present

## 2022-12-04 DIAGNOSIS — R197 Diarrhea, unspecified: Secondary | ICD-10-CM | POA: Diagnosis not present

## 2022-12-04 DIAGNOSIS — R110 Nausea: Secondary | ICD-10-CM | POA: Diagnosis not present

## 2022-12-04 DIAGNOSIS — U071 COVID-19: Secondary | ICD-10-CM | POA: Diagnosis not present

## 2023-02-04 DIAGNOSIS — Z23 Encounter for immunization: Secondary | ICD-10-CM | POA: Diagnosis not present

## 2023-02-05 DIAGNOSIS — C029 Malignant neoplasm of tongue, unspecified: Secondary | ICD-10-CM | POA: Diagnosis not present

## 2023-02-11 DIAGNOSIS — Z23 Encounter for immunization: Secondary | ICD-10-CM | POA: Diagnosis not present

## 2023-04-08 ENCOUNTER — Telehealth: Payer: Self-pay | Admitting: Adult Health

## 2023-04-16 DIAGNOSIS — M810 Age-related osteoporosis without current pathological fracture: Secondary | ICD-10-CM | POA: Diagnosis not present

## 2023-05-05 DIAGNOSIS — E871 Hypo-osmolality and hyponatremia: Secondary | ICD-10-CM | POA: Diagnosis not present

## 2023-05-05 DIAGNOSIS — E7800 Pure hypercholesterolemia, unspecified: Secondary | ICD-10-CM | POA: Diagnosis not present

## 2023-05-05 DIAGNOSIS — Z79899 Other long term (current) drug therapy: Secondary | ICD-10-CM | POA: Diagnosis not present

## 2023-05-11 DIAGNOSIS — E7800 Pure hypercholesterolemia, unspecified: Secondary | ICD-10-CM | POA: Diagnosis not present

## 2023-05-11 DIAGNOSIS — Z0001 Encounter for general adult medical examination with abnormal findings: Secondary | ICD-10-CM | POA: Diagnosis not present

## 2023-05-11 DIAGNOSIS — E871 Hypo-osmolality and hyponatremia: Secondary | ICD-10-CM | POA: Diagnosis not present

## 2023-05-11 DIAGNOSIS — Z79899 Other long term (current) drug therapy: Secondary | ICD-10-CM | POA: Diagnosis not present

## 2023-05-11 DIAGNOSIS — M810 Age-related osteoporosis without current pathological fracture: Secondary | ICD-10-CM | POA: Diagnosis not present

## 2023-05-13 ENCOUNTER — Other Ambulatory Visit: Payer: Self-pay | Admitting: Family Medicine

## 2023-05-13 DIAGNOSIS — M810 Age-related osteoporosis without current pathological fracture: Secondary | ICD-10-CM

## 2023-06-08 DIAGNOSIS — C029 Malignant neoplasm of tongue, unspecified: Secondary | ICD-10-CM | POA: Diagnosis not present

## 2023-06-21 DIAGNOSIS — Z96651 Presence of right artificial knee joint: Secondary | ICD-10-CM | POA: Diagnosis not present

## 2023-06-21 DIAGNOSIS — M25561 Pain in right knee: Secondary | ICD-10-CM | POA: Diagnosis not present

## 2023-07-21 ENCOUNTER — Encounter: Payer: Self-pay | Admitting: Adult Health

## 2023-07-21 ENCOUNTER — Inpatient Hospital Stay: Payer: Medicare Other | Attending: Adult Health | Admitting: Adult Health

## 2023-07-21 VITALS — BP 152/70 | HR 59 | Temp 98.2°F | Resp 18 | Ht 63.0 in | Wt 139.3 lb

## 2023-07-21 DIAGNOSIS — K219 Gastro-esophageal reflux disease without esophagitis: Secondary | ICD-10-CM | POA: Diagnosis not present

## 2023-07-21 DIAGNOSIS — C021 Malignant neoplasm of border of tongue: Secondary | ICD-10-CM | POA: Diagnosis not present

## 2023-07-21 NOTE — Progress Notes (Signed)
 [facility] Cancer Follow up:    Erika Bussing, MD [address] [place] [address]   DIAGNOSIS:  Cancer Staging  Squamous cell carcinoma of lateral tongue (HCC) Staging form: Oral Cavity, AJCC 8th Edition - Pathologic: Stage III (pT3, pN0, cM0) - Signed by Lonie Peak, MD on 03/15/2020   SUMMARY OF ONCOLOGIC HISTORY: Oncology History  Squamous cell carcinoma of lateral tongue (HCC)  02/16/2020 Initial Diagnosis   Tongue cancer (HCC)   02/16/2020 Surgery   Dr. Pollyann Kennedy, who performed a left partial glossectomy and selective left neck dissection on the date of 02/16/2020. Pathology from the procedure revealed 1.1cm of invasive moderately to poorly differentiated squamous cell carcinoma that invaded a depth of 0.5 cm. Peripheral and deep resection margins were negative for carcinoma or high-grade dysplasia. There was no lymphovascular invasion.  Twenty-two left neck lymph nodes were dissected and all were negative for metastatic carcinoma.      03/01/2020 Cancer Staging   Staging form: Oral Cavity, AJCC 8th Edition - Pathologic: Stage III (pT3, pN0, cM0) - Signed by Lonie Peak, MD on 03/15/2020   04/01/2020 - 04/17/2020 Radiation Therapy   Site Technique Total Dose (Gy) Dose per Fx (Gy) Completed Fx Beam Energies  Tongue: HN_L_Tongu IMRT 32.5/50 2.5 13/20 6X       CURRENT THERAPY: observation  INTERVAL HISTORY:   Discussed the use of AI scribe software for clinical note transcription with the patient, who gave verbal consent to proceed.  Erika [name] 78 y.o. female returns for f/u of her head and neck cancer.   She reports that her swallowing is stable and about the same as last year. She continues to see Dr. Pollyann Kennedy and denies any new pain. She stays active throughout the day, enjoying gardening, walking, and socializing. She has a son and daughter-in-law living with her. She reports that radiation treatment ruined her teeth, which were  black and reduced to nubs. A new dentist was able to rebuild her teeth, significantly improving her appearance and self-confidence. She also reports a history of reflux, for which she saw a GI specialist several months ago. She reports no current issues with reflux. Her weight has been stable, fluctuating between 130 and 150 pounds. She reports a significant weight gain during the COVID-19 pandemic, followed by weight loss due to cancer and RSV. She has since regained the weight.   Patient Active Problem List   Diagnosis Date Noted   Total knee replacement status 05/08/2022   Intractable nausea and vomiting 04/19/2020   Squamous cell carcinoma of lateral tongue (HCC) 02/16/2020   Torus mandibularis 12/14/2019   Hyperlipidemia 12/14/2017   Abnormal EKG 12/14/2017   Thyroid nodule 12/14/2017   A-fib (HCC)    AF (paroxysmal atrial fibrillation) (HCC) 10/15/2017   Leukopenia 10/12/2017   Thrombocytopenia (HCC) 10/12/2017   Elevated transaminase level 10/12/2017   Acute hyponatremia 10/12/2017   Sepsis secondary to UTI (HCC) 10/12/2017   Hypertension 10/12/2017   Positive D dimer 10/12/2017   Recurrent UTI 10/12/2017   GERD (gastroesophageal reflux disease) 10/12/2017   Arthritis 10/12/2017   Tick bites 10/12/2017   Status post total right knee replacement 03/31/2015   Chronic cough 11/03/2012    is allergic to hydrochlorothiazide, morphine, atorvastatin, codeine, and strawberry flavoring agent (non-screening).  MEDICAL HISTORY: Past Medical History:  Diagnosis Date   A-fib [facility])    Adrenal nodule (HCC)    Anxiety    Arthritis    Atrophic vaginitis  Cancer (HCC)    oral   Chronic hyponatremia    Dysrhythmia    Female climacteric state    GERD (gastroesophageal reflux disease)    Headache    Hypercholesteremia    Hyperlipidemia    Hypertension    Leukopenia    Pneumonia    PONV (postoperative nausea and vomiting)    Recurrent UTI    Right thyroid nodule     Thrombocytopenia (HCC)    Tongue ulcer     SURGICAL HISTORY: Past Surgical History:  Procedure Laterality Date   COLONOSCOPY     ESOPHAGOGASTRODUODENOSCOPY     EXCISION OF TONGUE LESION N/A 02/16/2020   Procedure: EXCISION OF TONGUE LESION Partial Glossectomy;  Surgeon: Serena Colonel, MD;  Location: [facility] OR;  Service: ENT;  Laterality: N/A;   EYE SURGERY     cataract both eyes   HAMMER TOE SURGERY Right 2022   HERNIA REPAIR     x2  groin   JOINT REPLACEMENT     KNEE ARTHROPLASTY Left 05/08/2022   Procedure: COMPUTER ASSISTED TOTAL KNEE ARTHROPLASTY - RNFA;  Surgeon: Donato Heinz, MD;  Location: [facility] ORS;  Service: Orthopedics;  Laterality: Left;   PARTIAL GLOSSECTOMY  02/16/2020   EXCISION OF TONGUE LESION Partial Glossectomy (N/A Mouth)   RADICAL NECK DISSECTION Left 02/16/2020   Procedure: RADICAL NECK DISSECTION Partial Left Neck Dissection;  Surgeon: Serena Colonel, MD;  Location[facility];  Service: ENT;  Laterality: Left;   REPLACEMENT TOTAL KNEE     right knee   SKIN CANCER EXCISION  05/2017   Excised from calf area   TONSILLECTOMY     TUBAL LIGATION      SOCIAL HISTORY: Social History   Socioeconomic History   Marital status: Divorced    Spouse name: Not on file   Number of children: 3   Years of education: Not on file   Highest education level: Not on file  Occupational History   Occupation: Airline pilot Rep//Customer Service Rep    Employer: [employer]  Tobacco Use   Smoking status: Never   Smokeless tobacco: Never  Vaping Use   Vaping status: Never Used  Substance and Sexual Activity   Alcohol use: No   Drug use: No   Sexual activity: Not Currently  Other Topics Concern   Not on file  Social History Narrative   Lives with son and wife who are mentally challenged   Social Drivers of Health   Financial Resource Strain: Low Risk  (06/21/2023)   Received from [facility]   Overall Financial Resource Strain (CARDIA)    Difficulty of  Paying Living Expenses: Not hard at all  Food Insecurity: No Food Insecurity (06/21/2023)   Received from [facility]   Hunger Vital Sign    Worried About Running Out of Food in the Last Year: Never true    Ran Out of Food in the Last Year: Never true  Transportation Needs: No Transportation Needs (06/21/2023)   Received from [facility] - Transportation    In the past 12 months, has lack of transportation kept you from medical appointments or from getting medications?: No    Lack of Transportation (Non-Medical): No  Physical Activity: Not on file  Stress: Stress Concern Present (03/22/2020)   [employer] of Occupational Health - Occupational Stress Questionnaire    Feeling of Stress : To some extent  Social Connections: Moderately Integrated (03/22/2020)   Social Connection  and Isolation Panel [NHANES]    Frequency of Communication with Friends and Family: Three times a week    Frequency of Social Gatherings with Friends and Family: Three times a week    Attends Religious Services: More than 4 times per year    Active Member of Clubs or Organizations: Yes    Attends Banker Meetings: Not on file    Marital Status: Widowed  Intimate Partner Violence: Not At Risk (05/08/2022)   Humiliation, Afraid, Rape, and Kick questionnaire    Fear of Current or Ex-Partner: No    Emotionally Abused: No    Physically Abused: No    Sexually Abused: No    FAMILY HISTORY: Family History  Problem Relation Age of Onset   Emphysema Sister    Emphysema Brother    Hypertension Mother    Heart failure Mother    Hypertension Father    CVA Father     Review of Systems  Constitutional:  Negative for appetite change, chills, fatigue, fever and unexpected weight change.  HENT:   Negative for hearing loss, lump/mass and trouble swallowing.   Eyes:  Negative for eye problems and icterus.  Respiratory:  Negative for chest tightness, cough and  shortness of breath.   Cardiovascular:  Negative for chest pain, leg swelling and palpitations.  Gastrointestinal:  Negative for abdominal distention, abdominal pain, constipation, diarrhea, nausea and vomiting.  Endocrine: Negative for hot flashes.  Genitourinary:  Negative for difficulty urinating.   Musculoskeletal:  Negative for arthralgias.  Skin:  Negative for itching and rash.  Neurological:  Negative for dizziness, extremity weakness, headaches and numbness.  Hematological:  Negative for adenopathy. Does not bruise/bleed easily.  Psychiatric/Behavioral:  Negative for depression. The patient is not nervous/anxious.       PHYSICAL EXAMINATION    Vitals:   07/21/23 1050  BP: (!) 152/70  Pulse: (!) 59  Resp: 18  Temp: 98.2 F (36.8 C)  SpO2: 99%    Physical Exam Constitutional:      General: She is not in acute distress.    Appearance: Normal appearance. She is not toxic-appearing.  HENT:     Head: Normocephalic and atraumatic.     Mouth/Throat:     Mouth: Mucous membranes are moist.     Pharynx: Oropharynx is clear. No oropharyngeal exudate or posterior oropharyngeal erythema.  Eyes:     General: No scleral icterus. Cardiovascular:     Rate and Rhythm: Normal rate and regular rhythm.     Pulses: Normal pulses.     Heart sounds: Normal heart sounds.  Pulmonary:     Effort: Pulmonary effort is normal.     Breath sounds: Normal breath sounds.  Abdominal:     General: Abdomen is flat. Bowel sounds are normal. There is no distension.     Palpations: Abdomen is soft.     Tenderness: There is no abdominal tenderness.  Musculoskeletal:        General: No swelling.     Cervical back: Neck supple.  Lymphadenopathy:     Cervical: No cervical adenopathy.  Skin:    General: Skin is warm and dry.     Findings: No rash.  Neurological:     General: No focal deficit present.     Mental Status: She is alert.  Psychiatric:        Mood and Affect: Mood normal.         Behavior: Behavior normal.       ASSESSMENT and THERAPY  PLAN:   Squamous cell carcinoma of lateral tongue (HCC) History of Head and Neck Cancer Weight stable between 130-150 lbs. Active lifestyle and social connections maintained. - Encouraged maintaining active lifestyle and social connections. - Follow up with primary care and oncology as needed.  Radiation-induced dental damage Significant dental damage from radiation therapy. Recent dental work improved condition. - Continue regular dental check-ups and maintenance.  Reflux Reflux managed with previous GI consultation. Advised to continue current management. - Continue current reflux management. - Follow up with GI as needed.     All questions were answered. The patient knows to call the clinic with any problems, questions or concerns. We can certainly see the patient much sooner if necessary.  Total encounter time:15 minutes*in face-to-face visit time, chart review, lab review, care coordination, order entry, and documentation of the encounter time.    Lillard Anes, NP 07/24/23 9:34 AM Medical Oncology and Hematology [facility] [address] [place], [place] [address] Tel. [phone]    Fax. [phone]  *Total Encounter Time as defined by the Centers for Medicare and Medicaid Services includes, in addition to the face-to-face time of a patient visit (documented in the note above) non-face-to-face time: obtaining and reviewing outside history, ordering and reviewing medications, tests or procedures, care coordination (communications with other health care professionals or caregivers) and documentation in the medical record.

## 2023-07-24 NOTE — Assessment & Plan Note (Signed)
 History of Head and Neck Cancer Weight stable between 130-150 lbs. Active lifestyle and social connections maintained. - Encouraged maintaining active lifestyle and social connections. - Follow up with primary care and oncology as needed.  Radiation-induced dental damage Significant dental damage from radiation therapy. Recent dental work improved condition. - Continue regular dental check-ups and maintenance.  Reflux Reflux managed with previous GI consultation. Advised to continue current management. - Continue current reflux management. - Follow up with GI as needed.

## 2023-08-05 DIAGNOSIS — H26492 Other secondary cataract, left eye: Secondary | ICD-10-CM | POA: Diagnosis not present

## 2023-08-05 DIAGNOSIS — Z961 Presence of intraocular lens: Secondary | ICD-10-CM | POA: Diagnosis not present

## 2023-08-05 DIAGNOSIS — H353132 Nonexudative age-related macular degeneration, bilateral, intermediate dry stage: Secondary | ICD-10-CM | POA: Diagnosis not present

## 2023-08-05 DIAGNOSIS — H52203 Unspecified astigmatism, bilateral: Secondary | ICD-10-CM | POA: Diagnosis not present

## 2024-01-14 ENCOUNTER — Other Ambulatory Visit: Payer: Medicare Other

## 2024-02-09 ENCOUNTER — Ambulatory Visit (HOSPITAL_BASED_OUTPATIENT_CLINIC_OR_DEPARTMENT_OTHER)
Admission: RE | Admit: 2024-02-09 | Discharge: 2024-02-09 | Disposition: A | Discharge status: Home / Self Care | Source: Ambulatory Visit | Attending: Family Medicine | Admitting: Family Medicine

## 2024-02-09 DIAGNOSIS — M810 Age-related osteoporosis without current pathological fracture: Secondary | ICD-10-CM | POA: Insufficient documentation

## 2024-04-12 ENCOUNTER — Ambulatory Visit: Admitting: Podiatry

## 2024-04-12 ENCOUNTER — Encounter: Payer: Self-pay | Admitting: Podiatry

## 2024-04-12 VITALS — Ht 63.0 in | Wt 139.3 lb

## 2024-04-12 DIAGNOSIS — D2371 Other benign neoplasm of skin of right lower limb, including hip: Secondary | ICD-10-CM

## 2024-04-12 NOTE — Progress Notes (Signed)
 Chief Complaint  Patient presents with   Plantar Warts    Pt is here due to possible plantar wart to the bottom of the right foot, that is causing some pain, wants to have it removed.    Subjective: 78 y.o. female presenting to the office today for evaluation of a symptomatic skin lesion to the plantar aspect of the right forefoot ongoing for about 1 year   Past Medical History:  Diagnosis Date   A-fib (HCC)    Adrenal nodule    Anxiety    Arthritis    Atrophic vaginitis    Cancer (HCC)    oral   Chronic hyponatremia    Dysrhythmia    Female climacteric state    GERD (gastroesophageal reflux disease)    Headache    Hypercholesteremia    Hyperlipidemia    Hypertension    Leukopenia    Pneumonia    PONV (postoperative nausea and vomiting)    Recurrent UTI    Right thyroid  nodule    Thrombocytopenia    Tongue ulcer     Past Surgical History:  Procedure Laterality Date   COLONOSCOPY     ESOPHAGOGASTRODUODENOSCOPY     EXCISION OF TONGUE LESION N/A 02/16/2020   Procedure: EXCISION OF TONGUE LESION Partial Glossectomy;  Surgeon: Jesus Oliphant, MD;  Location: [facility] OR;  Service: ENT;  Laterality: N/A;   EYE SURGERY     cataract both eyes   HAMMER TOE SURGERY Right 2022   HERNIA REPAIR     x2  groin   JOINT REPLACEMENT     KNEE ARTHROPLASTY Left 05/08/2022   Procedure: COMPUTER ASSISTED TOTAL KNEE ARTHROPLASTY - RNFA;  Surgeon: Mardee Lynwood SQUIBB, MD;  Location: [facility] ORS;  Service: Orthopedics;  Laterality: Left;   PARTIAL GLOSSECTOMY  02/16/2020   EXCISION OF TONGUE LESION Partial Glossectomy (N/A Mouth)   RADICAL NECK DISSECTION Left 02/16/2020   Procedure: RADICAL NECK DISSECTION Partial Left Neck Dissection;  Surgeon: Jesus Oliphant, MD;  Location: [facility] OR;  Service: ENT;  Laterality: Left;   REPLACEMENT TOTAL KNEE     right knee   SKIN CANCER EXCISION  05/2017   Excised from calf area   TONSILLECTOMY     TUBAL LIGATION      Allergies[1]   Objective:  Physical  Exam General: Alert and oriented x3 in no acute distress  Dermatology: Hyperkeratotic lesion(s) present on the plantar aspect of the right forefoot. Pain on palpation with a central nucleated core noted. Skin is warm, dry and supple bilateral lower extremities. Negative for open lesions or macerations.  Vascular: Palpable pedal pulses bilaterally. No edema or erythema noted. Capillary refill within normal limits.  Neurological: Grossly intact via light touch  Musculoskeletal Exam: Pain on palpation at the keratotic lesion(s) noted. Range of motion within normal limits bilateral. Muscle strength 5/5 in all groups bilateral.  Assessment: 1.  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  -Salicylic acid applied with a bandaid -Recommend OTC salicylic acid daily x 4 weeks -Return to the clinic 4 weeks  Thresa EMERSON Sar, DPM [facility]  Dr. Thresa EMERSON Sar, DPM    [address]  [place], [place] [address]                Office [phone]  Fax [phone]        [1]  Allergies Allergen Reactions   Hydrochlorothiazide Other (See Comments)    Brain swelling   Morphine  Hives   Atorvastatin Other (See Comments)    cramps   Codeine Nausea And Vomiting   Strawberry Flavoring Agent (Non-Screening) Itching

## 2024-05-10 ENCOUNTER — Ambulatory Visit: Admitting: Podiatry

## 2024-05-10 ENCOUNTER — Encounter: Payer: Self-pay | Admitting: Podiatry

## 2024-05-10 VITALS — Ht 63.0 in | Wt 139.3 lb

## 2024-05-10 DIAGNOSIS — D2371 Other benign neoplasm of skin of right lower limb, including hip: Secondary | ICD-10-CM | POA: Diagnosis not present

## 2024-05-10 NOTE — Progress Notes (Signed)
 "  Chief Complaint  Patient presents with   Plantar Warts    Pt is here to f/u on right foot due to plantar warts she states everything is going well, still has some soreness while walking but it is better then before.    Subjective: 79 y.o. female presenting to the office today for follow-up evaluation of a symptomatic skin lesion to the plantar aspect of the right forefoot ongoing for about 1 year.  She has been applying the salicylic acid daily in the past month.  She has noticed improvement   Past Medical History:  Diagnosis Date   A-fib (HCC)    Adrenal nodule    Anxiety    Arthritis    Atrophic vaginitis    Cancer (HCC)    oral   Chronic hyponatremia    Dysrhythmia    Female climacteric state    GERD (gastroesophageal reflux disease)    Headache    Hypercholesteremia    Hyperlipidemia    Hypertension    Leukopenia    Pneumonia    PONV (postoperative nausea and vomiting)    Recurrent UTI    Right thyroid  nodule    Thrombocytopenia    Tongue ulcer     Past Surgical History:  Procedure Laterality Date   COLONOSCOPY     ESOPHAGOGASTRODUODENOSCOPY     EXCISION OF TONGUE LESION N/A 02/16/2020   Procedure: EXCISION OF TONGUE LESION Partial Glossectomy;  Surgeon: Jesus Oliphant, MD;  Location: [facility] OR;  Service: ENT;  Laterality: N/A;   EYE SURGERY     cataract both eyes   HAMMER TOE SURGERY Right 2022   HERNIA REPAIR     x2  groin   JOINT REPLACEMENT     KNEE ARTHROPLASTY Left 05/08/2022   Procedure: COMPUTER ASSISTED TOTAL KNEE ARTHROPLASTY - RNFA;  Surgeon: Mardee Lynwood SQUIBB, MD;  Location: [facility] ORS;  Service: Orthopedics;  Laterality: Left;   PARTIAL GLOSSECTOMY  02/16/2020   EXCISION OF TONGUE LESION Partial Glossectomy (N/A Mouth)   RADICAL NECK DISSECTION Left 02/16/2020   Procedure: RADICAL NECK DISSECTION Partial Left Neck Dissection;  Surgeon: Jesus Oliphant, MD;  [facility] OR;  Service: ENT;  Laterality: Left;   REPLACEMENT TOTAL KNEE     right knee   SKIN  CANCER EXCISION  05/2017   Excised from calf area   TONSILLECTOMY     TUBAL LIGATION      Allergies[1]   Objective:  Physical Exam General: Alert and oriented x3 in no acute distress  Dermatology: Hyperkeratotic lesion(s) present on the plantar aspect of the right forefoot. Pain on palpation with a central nucleated core noted. Skin is warm, dry and supple bilateral lower extremities. Negative for open lesions or macerations.  Vascular: Palpable pedal pulses bilaterally. No edema or erythema noted. Capillary refill within normal limits.  Neurological: Grossly intact via light touch  Musculoskeletal Exam: Pain on palpation at the keratotic lesion(s) noted. Range of motion within normal limits bilateral. Muscle strength 5/5 in all groups bilateral.  Assessment: 1.  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  -Salicylic acid applied with a bandaid - Resume full activity with no restrictions.  Recommend good supportive tennis shoes and sneakers -Return to the clinic PRN  Thresa EMERSON Sar, DPM [facility]  Dr. Thresa EMERSON Sar, DPM    [address]  [place], [place] [address]                Office [phone]  Fax [phone]        [1]  Allergies Allergen Reactions   Hydrochlorothiazide Other (See Comments)    Brain swelling   Morphine  Hives   Atorvastatin Other (See Comments)    cramps   Codeine Nausea And Vomiting   Strawberry Flavoring Agent (Non-Screening) Itching   "

## 2024-07-20 ENCOUNTER — Encounter: Admitting: Adult Health

## 2024-07-20 ENCOUNTER — Inpatient Hospital Stay
